# Patient Record
Sex: Female | Born: 1935 | Race: White | Hispanic: No | Marital: Married | Smoking: Former smoker
Health system: Southern US, Community
[De-identification: ages and names within clinical notes are randomized; demographics above are authoritative.]

## PROBLEM LIST (undated history)

## (undated) DIAGNOSIS — K219 Gastro-esophageal reflux disease without esophagitis: Secondary | ICD-10-CM

## (undated) DIAGNOSIS — R413 Other amnesia: Secondary | ICD-10-CM

## (undated) DIAGNOSIS — M1990 Unspecified osteoarthritis, unspecified site: Secondary | ICD-10-CM

## (undated) DIAGNOSIS — M1712 Unilateral primary osteoarthritis, left knee: Secondary | ICD-10-CM

## (undated) DIAGNOSIS — E7800 Pure hypercholesterolemia, unspecified: Secondary | ICD-10-CM

## (undated) DIAGNOSIS — C801 Malignant (primary) neoplasm, unspecified: Secondary | ICD-10-CM

## (undated) HISTORY — PX: TONSILLECTOMY: SUR1361

## (undated) HISTORY — PX: MASTECTOMY: SHX3

## (undated) HISTORY — PX: JOINT REPLACEMENT: SHX530

## (undated) HISTORY — PX: APPENDECTOMY: SHX54

---

## 1971-11-08 DIAGNOSIS — C801 Malignant (primary) neoplasm, unspecified: Secondary | ICD-10-CM

## 1971-11-08 HISTORY — DX: Malignant (primary) neoplasm, unspecified: C80.1

## 1973-11-07 HISTORY — PX: ABDOMINAL HYSTERECTOMY: SHX81

## 1973-11-07 HISTORY — PX: BREAST SURGERY: SHX581

## 1999-03-24 ENCOUNTER — Encounter: Payer: Self-pay | Admitting: Orthopedic Surgery

## 1999-03-24 ENCOUNTER — Ambulatory Visit (HOSPITAL_COMMUNITY): Admission: RE | Admit: 1999-03-24 | Discharge: 1999-03-24 | Payer: Self-pay | Admitting: Orthopedic Surgery

## 1999-07-22 ENCOUNTER — Ambulatory Visit (HOSPITAL_COMMUNITY): Admission: RE | Admit: 1999-07-22 | Discharge: 1999-07-22 | Payer: Self-pay | Admitting: Orthopedic Surgery

## 1999-07-22 ENCOUNTER — Encounter: Payer: Self-pay | Admitting: Orthopedic Surgery

## 2000-12-04 ENCOUNTER — Inpatient Hospital Stay (HOSPITAL_COMMUNITY): Admission: EM | Admit: 2000-12-04 | Discharge: 2000-12-11 | Payer: Self-pay | Admitting: Emergency Medicine

## 2000-12-04 ENCOUNTER — Encounter: Payer: Self-pay | Admitting: Emergency Medicine

## 2000-12-05 ENCOUNTER — Encounter: Payer: Self-pay | Admitting: Orthopedic Surgery

## 2000-12-10 ENCOUNTER — Encounter: Payer: Self-pay | Admitting: Internal Medicine

## 2000-12-11 ENCOUNTER — Inpatient Hospital Stay (HOSPITAL_COMMUNITY)
Admission: RE | Admit: 2000-12-11 | Discharge: 2000-12-18 | Payer: Self-pay | Admitting: Physical Medicine & Rehabilitation

## 2001-08-14 ENCOUNTER — Encounter (INDEPENDENT_AMBULATORY_CARE_PROVIDER_SITE_OTHER): Payer: Self-pay | Admitting: Specialist

## 2001-08-14 ENCOUNTER — Ambulatory Visit (HOSPITAL_COMMUNITY): Admission: RE | Admit: 2001-08-14 | Discharge: 2001-08-14 | Payer: Self-pay | Admitting: Gastroenterology

## 2001-08-23 ENCOUNTER — Encounter: Payer: Self-pay | Admitting: Gastroenterology

## 2001-08-23 ENCOUNTER — Ambulatory Visit (HOSPITAL_COMMUNITY): Admission: RE | Admit: 2001-08-23 | Discharge: 2001-08-23 | Payer: Self-pay | Admitting: Gastroenterology

## 2001-09-04 ENCOUNTER — Encounter: Admission: RE | Admit: 2001-09-04 | Discharge: 2001-11-02 | Payer: Self-pay | Admitting: Orthopedic Surgery

## 2001-11-15 ENCOUNTER — Ambulatory Visit (HOSPITAL_COMMUNITY): Admission: RE | Admit: 2001-11-15 | Discharge: 2001-11-15 | Payer: Self-pay | Admitting: Gastroenterology

## 2001-11-15 ENCOUNTER — Encounter (INDEPENDENT_AMBULATORY_CARE_PROVIDER_SITE_OTHER): Payer: Self-pay | Admitting: Specialist

## 2001-11-16 ENCOUNTER — Encounter: Admission: RE | Admit: 2001-11-16 | Discharge: 2001-12-27 | Payer: Self-pay | Admitting: Orthopedic Surgery

## 2002-04-01 ENCOUNTER — Encounter: Admission: RE | Admit: 2002-04-01 | Discharge: 2002-06-30 | Payer: Self-pay | Admitting: Orthopedic Surgery

## 2002-07-19 ENCOUNTER — Ambulatory Visit (HOSPITAL_COMMUNITY): Admission: RE | Admit: 2002-07-19 | Discharge: 2002-07-19 | Payer: Self-pay | Admitting: Gastroenterology

## 2002-08-09 ENCOUNTER — Ambulatory Visit (HOSPITAL_COMMUNITY): Admission: RE | Admit: 2002-08-09 | Discharge: 2002-08-09 | Payer: Self-pay | Admitting: Gastroenterology

## 2003-09-24 ENCOUNTER — Encounter: Admission: RE | Admit: 2003-09-24 | Discharge: 2003-09-24 | Payer: Self-pay | Admitting: Orthopedic Surgery

## 2003-10-04 ENCOUNTER — Encounter: Admission: RE | Admit: 2003-10-04 | Discharge: 2003-10-04 | Payer: Self-pay | Admitting: Orthopedic Surgery

## 2003-11-18 ENCOUNTER — Encounter: Admission: RE | Admit: 2003-11-18 | Discharge: 2003-11-18 | Payer: Self-pay | Admitting: Orthopedic Surgery

## 2003-12-02 ENCOUNTER — Encounter: Admission: RE | Admit: 2003-12-02 | Discharge: 2003-12-02 | Payer: Self-pay | Admitting: Orthopedic Surgery

## 2003-12-16 ENCOUNTER — Encounter: Admission: RE | Admit: 2003-12-16 | Discharge: 2003-12-16 | Payer: Self-pay | Admitting: Orthopedic Surgery

## 2004-04-13 ENCOUNTER — Ambulatory Visit (HOSPITAL_COMMUNITY): Admission: RE | Admit: 2004-04-13 | Discharge: 2004-04-13 | Payer: Self-pay | Admitting: Ophthalmology

## 2004-04-30 ENCOUNTER — Encounter: Admission: RE | Admit: 2004-04-30 | Discharge: 2004-04-30 | Payer: Self-pay | Admitting: Neurosurgery

## 2005-03-18 ENCOUNTER — Encounter: Admission: RE | Admit: 2005-03-18 | Discharge: 2005-03-18 | Payer: Self-pay | Admitting: Internal Medicine

## 2005-04-14 ENCOUNTER — Ambulatory Visit (HOSPITAL_COMMUNITY): Admission: RE | Admit: 2005-04-14 | Discharge: 2005-04-14 | Payer: Self-pay | Admitting: Neurosurgery

## 2005-04-28 ENCOUNTER — Encounter: Admission: RE | Admit: 2005-04-28 | Discharge: 2005-04-28 | Payer: Self-pay | Admitting: Neurosurgery

## 2005-05-23 ENCOUNTER — Encounter: Admission: RE | Admit: 2005-05-23 | Discharge: 2005-05-23 | Payer: Self-pay | Admitting: Neurosurgery

## 2005-06-16 ENCOUNTER — Emergency Department (HOSPITAL_COMMUNITY): Admission: EM | Admit: 2005-06-16 | Discharge: 2005-06-17 | Payer: Self-pay | Admitting: *Deleted

## 2005-07-13 ENCOUNTER — Ambulatory Visit (HOSPITAL_COMMUNITY): Admission: RE | Admit: 2005-07-13 | Discharge: 2005-07-13 | Payer: Self-pay | Admitting: Neurosurgery

## 2005-08-22 ENCOUNTER — Encounter: Admission: RE | Admit: 2005-08-22 | Discharge: 2005-10-21 | Payer: Self-pay | Admitting: Orthopedic Surgery

## 2005-12-29 IMAGING — CR DG CHEST 2V
2 series · 2 of 2 positions shown · non-contrast
Comparison: [DATE].

CLINICAL DATA: Preop for surgery on left hip.  
 CHEST - 2 VIEW:

[view not recorded (1 of 2)]
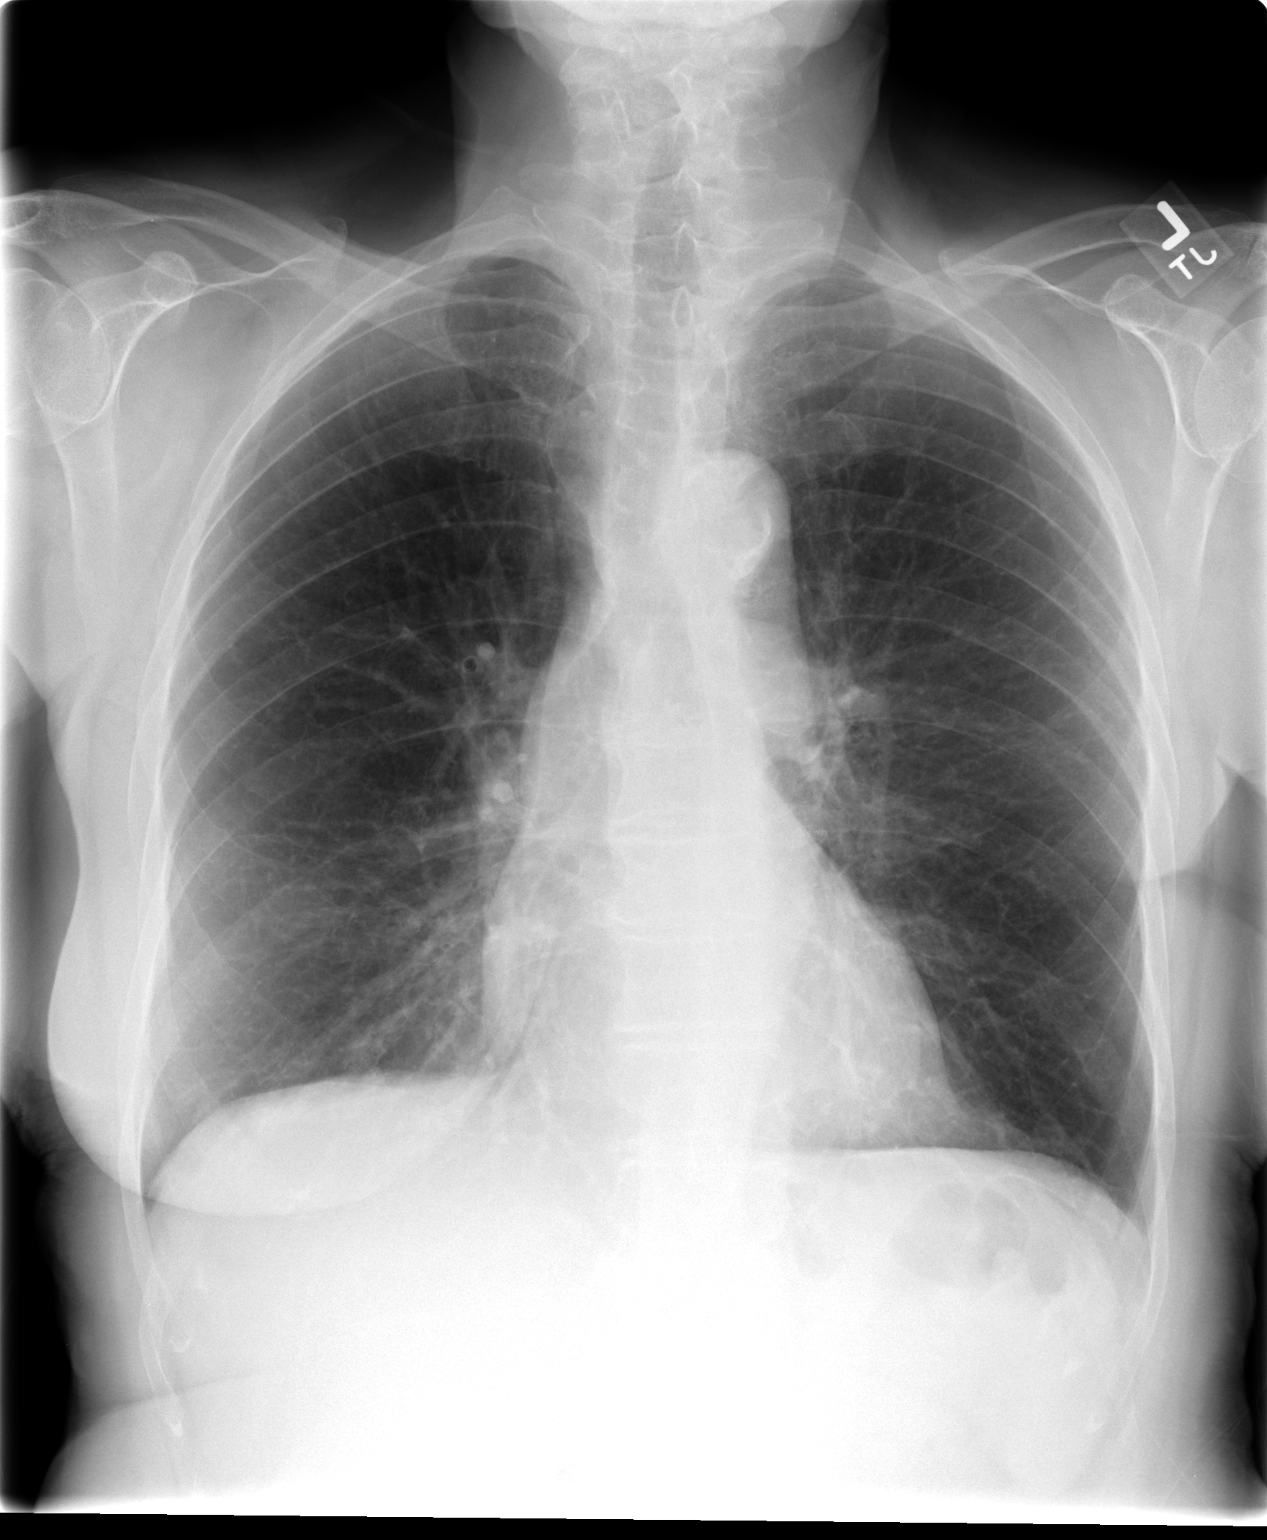

[view not recorded (2 of 2)]
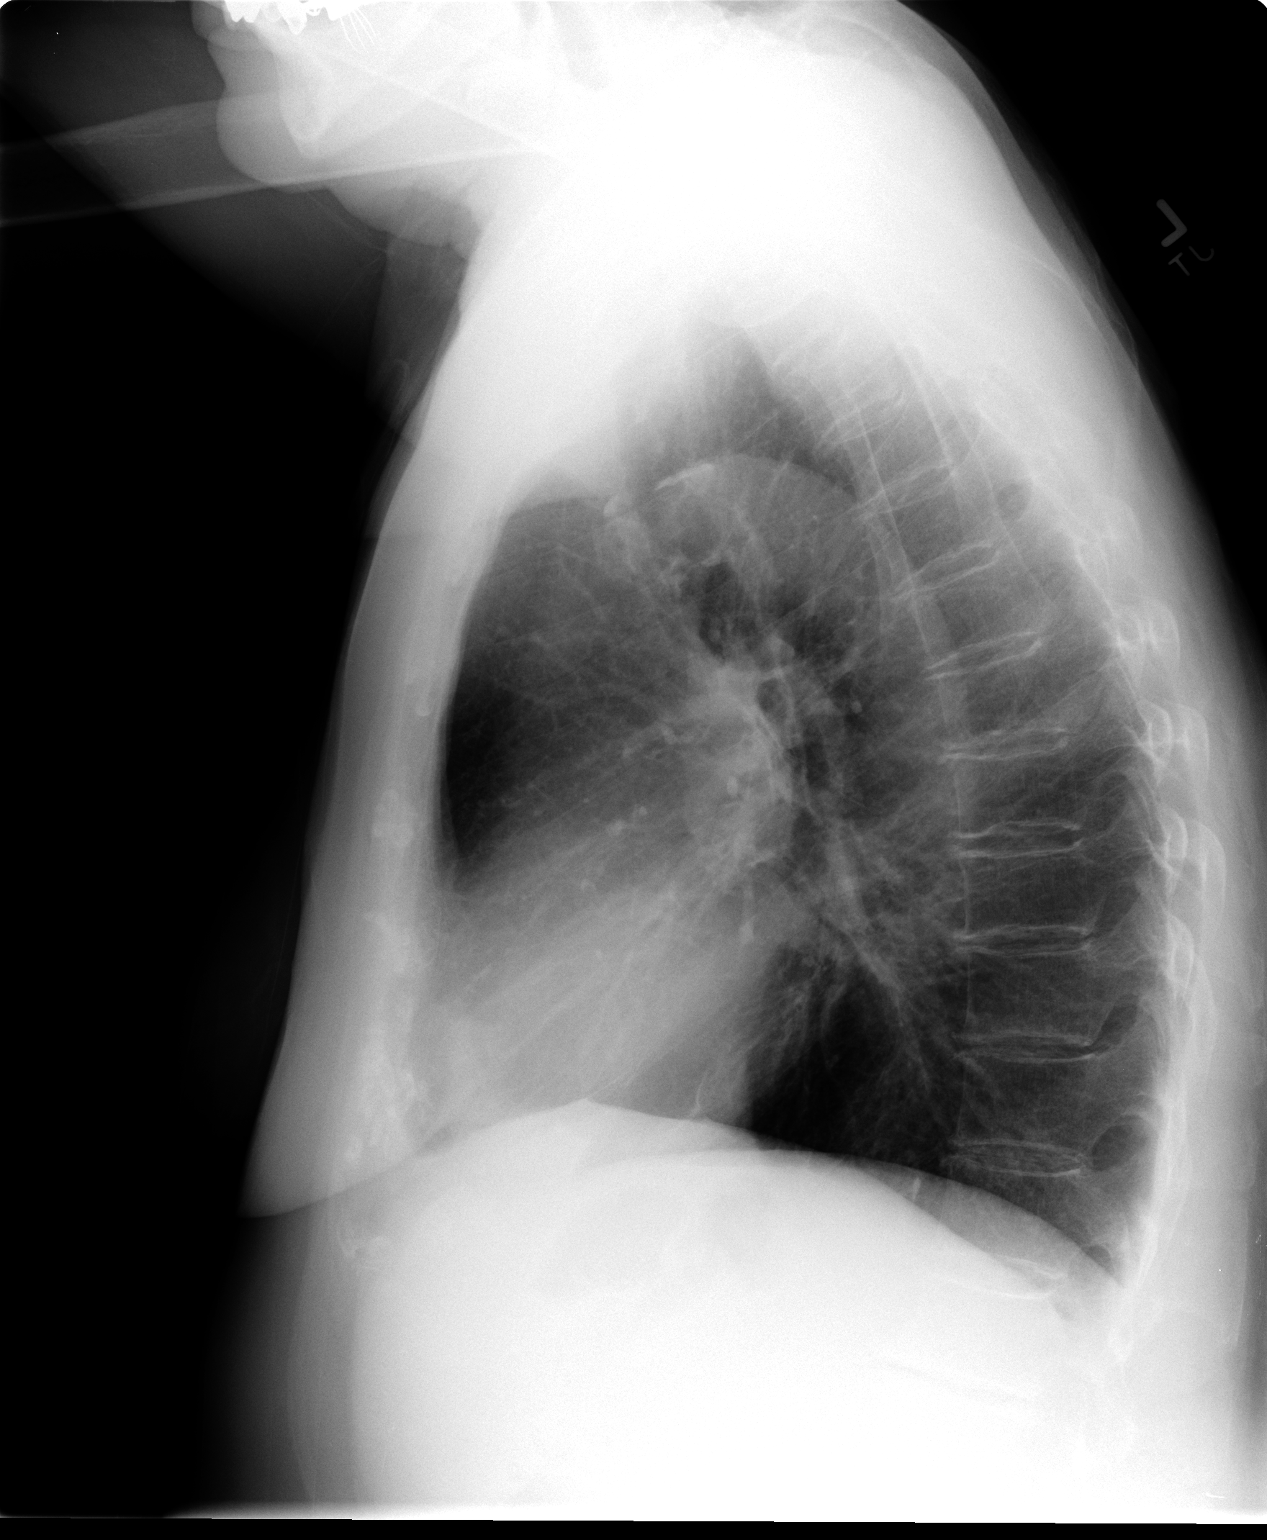

[2 of 2 positions shown; findings below may reference images not displayed]

FINDINGS: Two views of the chest show the lungs to be clear but hyperaerated.  Mild peribronchial thickening is noted.  The heart is within normal limits in size.  The bones are osteopenic.
IMPRESSION: No active lung disease.

## 2006-01-09 ENCOUNTER — Inpatient Hospital Stay (HOSPITAL_COMMUNITY): Admission: RE | Admit: 2006-01-09 | Discharge: 2006-01-12 | Payer: Self-pay | Admitting: Orthopedic Surgery

## 2006-01-09 ENCOUNTER — Ambulatory Visit: Payer: Self-pay | Admitting: Physical Medicine & Rehabilitation

## 2006-01-12 ENCOUNTER — Inpatient Hospital Stay
Admission: RE | Admit: 2006-01-12 | Discharge: 2006-01-19 | Payer: Self-pay | Admitting: Physical Medicine & Rehabilitation

## 2006-03-01 ENCOUNTER — Encounter: Admission: RE | Admit: 2006-03-01 | Discharge: 2006-03-28 | Payer: Self-pay | Admitting: Orthopedic Surgery

## 2007-06-22 ENCOUNTER — Encounter: Admission: RE | Admit: 2007-06-22 | Discharge: 2007-06-22 | Payer: Self-pay | Admitting: Internal Medicine

## 2007-06-26 ENCOUNTER — Encounter: Admission: RE | Admit: 2007-06-26 | Discharge: 2007-06-26 | Payer: Self-pay | Admitting: Internal Medicine

## 2007-06-27 ENCOUNTER — Encounter: Admission: RE | Admit: 2007-06-27 | Discharge: 2007-06-27 | Payer: Self-pay | Admitting: Internal Medicine

## 2007-07-02 ENCOUNTER — Encounter (INDEPENDENT_AMBULATORY_CARE_PROVIDER_SITE_OTHER): Payer: Self-pay | Admitting: Radiology

## 2007-07-02 ENCOUNTER — Encounter: Admission: RE | Admit: 2007-07-02 | Discharge: 2007-07-02 | Payer: Self-pay | Admitting: Internal Medicine

## 2007-07-13 ENCOUNTER — Encounter: Admission: RE | Admit: 2007-07-13 | Discharge: 2007-07-13 | Payer: Self-pay | Admitting: Radiology

## 2008-02-05 ENCOUNTER — Encounter: Admission: RE | Admit: 2008-02-05 | Discharge: 2008-02-05 | Payer: Self-pay | Admitting: Internal Medicine

## 2008-03-06 ENCOUNTER — Encounter: Admission: RE | Admit: 2008-03-06 | Discharge: 2008-03-06 | Payer: Self-pay | Admitting: Internal Medicine

## 2008-04-14 ENCOUNTER — Encounter: Admission: RE | Admit: 2008-04-14 | Discharge: 2008-04-14 | Payer: Self-pay | Admitting: Internal Medicine

## 2010-11-27 ENCOUNTER — Encounter: Payer: Self-pay | Admitting: Orthopedic Surgery

## 2010-11-28 ENCOUNTER — Encounter: Payer: Self-pay | Admitting: Neurosurgery

## 2011-02-07 ENCOUNTER — Other Ambulatory Visit: Payer: Self-pay | Admitting: Orthopedic Surgery

## 2011-02-07 DIAGNOSIS — M545 Low back pain: Secondary | ICD-10-CM

## 2011-02-09 ENCOUNTER — Ambulatory Visit
Admission: RE | Admit: 2011-02-09 | Discharge: 2011-02-09 | Disposition: A | Discharge status: Home / Self Care | Payer: Medicare Other | Source: Ambulatory Visit | Attending: Orthopedic Surgery | Admitting: Orthopedic Surgery

## 2011-02-09 DIAGNOSIS — M545 Low back pain: Secondary | ICD-10-CM

## 2011-03-25 NOTE — Op Note (Signed)
[facility]  Patient:    Alexis York, Alexis York                      MRN: [id number] Proc. Date: 12/05/00 Adm. Date:  [id number] Attending:  Colbert Ewing                           Operative Report  PREOPERATIVE DIAGNOSES: 1. Osteoarthritis, right hip. 2. Subtrochanteric fracture, right femur acute.  SURGICAL PROCEDURE:  Right total hip and ORIF of subtrochanteric femur fracture.  POSTOPERATIVE DIAGNOSES: 1. Osteoarthritis, right hip. 2. Subtrochanteric fracture, right femur, acute.  SURGEON:  John L. Dorothyann Gibbs, M.D.  ASSISTANT:  Jamelle Rushing, P.A.  ANESTHESIA:  General.  PATHOLOGY:  This 77 year old white female was actually on the schedule for a right total hip replacement, and had just visited family wedding in [place] when she fell at home the night of admission, sustaining a subtrochanteric femur fracture on the same side as end-stage osteoarthritis of the right hip. Both problems are being cared for simultaneously today.  DESCRIPTION OF PROCEDURE:  Under general anesthesia, the patient was placed in the left lateral decubitus position.  A standard posterior approach is made, extending it distally another four inches.  Dissection was carried down through skin and subcutaneous tissue, splitting the IT band.  A portion of the gluteus maximus insertion on the distal fragment had to be released to expose the fracture.  The short external rotators and hip capsule were also taken down from the medial side of the femoral neck, and consequently a comminuted greater trochanter and virtual transverse subtrochanteric fracture were clearly identified.  Attention was then turned to the distal shaft, which is progressively reamed up to 16 mm; which gave an excellent press fit.  The proximal femoral neck is then exposed and osteotomized.  The hip ball is removed at this point.  The acetabulum is exposed with cobra retractors inferiorly and wing  retractors superiorly.  It is appropriately reamed up to a 50 mm, and a 52 mm tri-spiked acetabular cuff is inserted.  At this point a 10-degree poly is inserted and trial off of rasp reveals relatively good fit, alignment and stability using a plus-12 hip ball.  Plan is made to use the Prodigy fully porous coated stem, and as there is approximately four inches of scratch fit distal to the near transverse subtrochanteric fracture.  At this point there is no calcar to set the medial stem on, and there is a large comminuted trochanteric fragment.  At this point the acetabular apex hole eliminator is inserted, and the acetabular liner Enduron 28 mm with 52 liner.  With acetabular side finished, the femoral component is inserted; it is inserted in such a manner as to have about 15 degrees anteversion, using the Prodigy 16.5 mm stem.  At this point the Palmedica trochanteric clamp with two Dall-Miles cables are placed in such a manner as to clamp the greater trochanter over the trochanteric portion of the AML hip stem, running the chrome cobalt cables through the hole in the AML stem.  In this manner, the trochanter is attached to the prosthesis.  Several smaller fragments, including calcar fragments and lesser trochanteric fragments, are then grasped with an additional Dall-Miles cable, which grasped additionally the distal portion of the greater trochanter pulling all of these fragments together.  Once all three of these are tightened in place, the femoral component is very stable  to both rotation and range of motion testing.  It is stable through a normal range of motion.  At this point, attention is turned to repair the hip capsule with #1 Tycron. The IT band is then closed with #1 Tycron, subcutaneous with 0 and 2-0 Vicryl, and skin clips.  OPERATIVE TIME:  Approximately 1 hr 30 min.  DISPOSITION:  Hematocrit during the case was 25, and one unit of blood was started.  Estimated blood  loss was 600 cc and the second unit of blood will be given in recovery.  Leg length at the end of the case was within 1/4 inch equal. DD:  12/05/00 TD:  12/05/00 Job: [id number]

## 2011-03-25 NOTE — H&P (Signed)
Alexis York, Alexis York               ACCOUNT NO.:  [id number]   MEDICAL RECORD NO.:  [id number]          PATIENT TYPE:  INP   LOCATION:  NA                           FACILITY:  [facility]   PHYSICIAN:  John L. Rendall, M.D.  DATE OF BIRTH:  1921-11-25   DATE OF ADMISSION:  DATE OF DISCHARGE:                                HISTORY & PHYSICAL   DATE OF ADMISSION:  January 09, 2006   CHIEF COMPLAINT:  Painful left hip.   HISTORY OF PRESENT ILLNESS:  An 77 year old white female with longstanding  pain over the left groin and hip with sharp stabbing pain and ache. She is  now to the point where she is having significant pain with activities of  daily living. She must use a cane for ambulation. Radiographically, she is  noted to have advanced DJD of the left hip and is now indicated for left  total hip replacement.   PAST MEDICAL HISTORY:  In general, health is fair. 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   SURGERY:  As above.   MEDICATIONS:  Actonel 30 mg weekly. She is also on a medication for her  bladder. She is unable to remember or has not written down the medications.   ALLERGIES:  CODEINE and CODEINE DERIVATIVES.   REVIEW OF SYSTEMS:  Positive for glasses, dentures, osteoporosis, cystocele,  left breast dysplasia.   FAMILY HISTORY:  Positive for mother who had breast, uterus, stomach  carcinoma, and who is deceased. Father who is deceased from renal cell  carcinoma. She has also had two sisters that are deceased - one with throat  cancer and one with breast cancer.   SOCIAL HISTORY:  An 77 year old white female who is married and retired. She  denies the use of  tobacco. She does use alcohol once a day.   EXAMINATION TODAY:  GENERAL:  Reveals a 77 year old white female, well-  developed, well-nourished; alert, pleasant, cooperative, in moderate  distress secondary to left groin and hip pain.  VITAL SIGNS:  Reveal temperature 97.5, pulse 76, respirations 18, blood  pressure 108/56.  HEENT:  Head is normocephalic. Eyes:  Pupils equal, round, and reactive to  light and accommodation. Extraocular movements intact. Ears, nose, and  throat were benign. Cerumen in the canals.  NECK:  Supple, no thyromegaly.  CHEST:  Had good expansion. Lungs were essentially clear.  CARDIAC:  Had a regular rhythm and rate, normal S1, S2. No murmurs, rubs, or  gallops appreciated. Pulses 2+ bilateral and symmetric, no bruits were  noted.  ABDOMEN:  Scaphoid, soft, nontender, no mass palpable. Normal bowel sounds  present.  GENITAL, RECTAL, AND BREAST:  Exam not indicated for the procedure.  CENTRAL NERVOUS SYSTEM:  She is oriented x3. Cranial nerves II-XII grossly  intact.  MUSCULOSKELETAL:  She has markedly positive logroll  in the left hip. She has  only a jog of internal and external rotation. She can flex to 95 degrees.  Marked pain with range of motion of the hip. Sensation is intact. Trace  pitting edema bilaterally.   Radiographic study reveals OA of the left hip.   CLINICAL IMPRESSION:  Osteoarthritis of the left hip.   RECOMMENDATIONS:  At this time we are going to plan on left total hip  arthroplasty. Procedure risks and benefits have been fully explained with  her. I used a prosthesis to show her the materials used. She has  understanding. She will proceed with total hip arthroplasty.      Oris Drone Petrarca, P.A.-C.      Carlisle Beers. Rendall, M.D.  Electronically Signed    BDP/MEDQ  D:  12/30/2005  T:  12/30/2005  Job:  [id number]

## 2011-03-25 NOTE — Discharge Summary (Signed)
[facility]  Patient:    Alexis York, Alexis York                      MRN: [id number] Adm. Date:  [id number] Disc. Date: [id number] Attending:  Faith Rogue T Dictator:   Dian Situ, PA CC:         Gordy Savers, M.D.  John L. Dorothyann Gibbs, M.D.   Discharge Summary  DISCHARGE DIAGNOSES: 1. Status post right total hip replacement and open reduction and internal    fixation of subtrochanteric hip fracture. 2. Urinary retention, resolved. 3. Mood stable.  HISTORY OF PRESENT ILLNESS:  Ms. Filosa is a 77 year old female with history of hypercholesterolemia, left breast cancer, depression, who fell on December 04, 2000, sustaining a right subtrochanteric hip fracture.  Evaluated by Dr. Priscille Kluver, and underwent right total hip replacement with ORIF of subtrochanteric hip fracture on December 05, 2000.  Postoperatively is touchdown weightbearing with Coumadin being used for DVT prophylaxis.  Medical issues have included problems with productive cough secondary to bronchitis as well as depressed mood.   Internal Medicine was consulted, and patient was started on Celexa to help with her mood.  Currently on IV Rocephin for bronchitis.  She is at min assist for transfers, min assist for ambulating 20 feet with a rolling walker.  PAST MEDICAL HISTORY: 1. Left mastectomy. 2. TAH/BSO. 3. Appendectomy. 4. 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 5. Chronic nausea. 6. OA.  ALLERGIES:  CODEINE and NIACIN.  SOCIAL HISTORY:  The patient is married, lives in one-level home with four steps at entry.  Was independent prior to admission.  Quit tobacco in 1973. Drinks one to two alcoholic drinks per day.  HOSPITAL COURSE:  Ms. Alexis York was admitted to rehabilitation on December 11, 2000, for inpatient therapies to consist of PT, OT daily.  Past admission the patient was maintained on Coumadin for DVT prophylaxis and  touchdown weightbearing status.  The patient was taken off IV antibiotics on December 12, 2000, and started on Ceftin.  Laboratories were checked at admission showing sodium 139, potassium 4.0, chloride 102, CO2 27, BUN 8, creatinine 0.8.  Of note, LFTs were elevated with AST at 43, ALT 53, alkaline phosphatase 215, total bilirubin 1.4.  CBC showed hemoglobin 11.6, hematocrit 34.6, white count 10.0, platelets _____.  Stool guaiacs were done x 2, and she was noted to have one positive guaiac.  A UA/UCS done showed 15,000 colonies of enterococcus which was pansensitive.  She has had problems with pain control; however, was unable to tolerate oxycodone.  The pain was reasonably controlled with the use of p.r.n. Ultram.  She was noted to have problems with urinary retention and was started on Flomax and Urecholine.  By the time of discharge the patient was voiding without difficulty and had been tapered off Urecholine.  The patient reported increased nausea with use of Pepcid and was changed over to Cipro instead.  Her mood has been stable, and she expressed displeasure with use of Celexa.  Both patient and husband felt that she did not require antidepressant and would not use this past discharge; therefore, this was discontinued per their request.  At the time of discharge the patient was modified independent for transfers, modified independent for ambulating short distances to maintain her touchdown weightbearing status.  Wheelchair was altered for home use secondary to inability to achieve household tasks, household ambulating distances, as well as decreased endurance and confidence.  She was at modified independent level for upper body care, requires supervision with assistive device for lower body care.  Husband is to provide assistance and supervision as needed past discharge.  On December 18, 2000, the patient is discharged to home.  The patient is to continue on Coumadin for 10 additional  days with [facility] to follow pro times. On December 18, 2000, the patient is discharged to home.  DISCHARGE MEDICATIONS: 1. Cipro 250 mg b.i.d. 2. Coumadin 3 mg per day. 3. Flomax 0.4 mg q.h.s. 4. Humibid L.A. 600 mg b.i.d. 5. Ultram 50 mg q.6h. p.r.n. pain. 6. Skelaxin 400 mg q.6h. p.r.n. spasms.  ACTIVITY:  Touchdown weightbearing right lower extremity, total hip precautions on right.  WOUND CARE:  Keep area clean and dry.  SPECIAL INSTRUCTIONS:  Genevieve Norlander to continue home health PT, OT, as well as ____ for pro time draws on December 21, 2000.  FOLLOW-UP:  The patient is to follow up with Dr. Priscille Kluver for recheck in three to four weeks.  Follow up with Dr. Riley Kill as needed. DD:  02/12/01 TD:  02/12/01 Job: [id number]

## 2011-03-25 NOTE — Op Note (Signed)
Alexis York, Alexis York               ACCOUNT NO.:  [id number]   MEDICAL RECORD NO.:  [id number]          PATIENT TYPE:  INP   LOCATION:  1505                         FACILITY:  [facility]   PHYSICIAN:  John L. Rendall, M.D.  DATE OF BIRTH:  1921/11/09   DATE OF PROCEDURE:  01/09/2006  DATE OF DISCHARGE:                                 OPERATIVE REPORT   PREOPERATIVE DIAGNOSIS:  Osteoarthritis left hip.   POSTOPERATIVE DIAGNOSIS:  Osteoarthritis left hip.   SURGICAL PROCEDURES:  Left AML total hip replacement.   SURGEON:  Dr. Erasmo Leventhal.   ASSISTANT:  Arnoldo Morale, PA-C.   ANESTHESIA:  General.   PATHOLOGY:  The patient has bare bone on the femoral head and advanced  osteoarthritis of the acetabulum, osteoporosis consistent with her age.   PROCEDURE:  Under general anesthesia, the left leg is prepared with DuraPrep  and draped as a sterile field in the right lateral decubitus position. A  posterior approach is made. The IT band is split in the line of its fibers,  deep Charnley retractor is inserted, incision is approximately 7 inches in  length. Short external rotators and hip capsule are taken down with  electrocautery from the femur, hip capsule is opened in a T-shaped manner  after first teasing off the short external rotators. The hip is then  dislocated, the superior femoral neck is examined and debrided. The IM  initiator and canal finder are used. Sequential reaming to 16 mm is then  done which gives a good scratch fit. At this point, the femoral neck is  osteotomized, rasps are then used, 12 narrow, 13-1/2 narrow ,15 narrow and  16 narrow which gives good fit of the upper triangle and seats well in the  calcar. A calcar reamer is then used. With the femur prepared, attention is  turned to the acetabulum, 2 cobras are placed inferiorly and the hip  superiorly while leaflets of the capsule are elevated and preserved. Two  wing retractors medium and large were placed beneath  the capsule and labrum  on the superior rim of the acetabulum and the entire labrum was then excised  in one large piece. The acetabular exposure is then excellent, sequential  reaming is then done, 45, 47, 49, 51 and 53; 53 seems to give a reasonably  good fit. It is then made somewhat deeper to get full bleeding bone with the  52 reamer going back to a 53 reamer and trial seating of a 54 trial is then  done with an angled 10 degrees poly and trial seating of the rasp and short  neck to 32 mm femoral head gives good position and alignment but tends to  sublux posteriorly. The prodigy neck however corrects this and it is fully  stable with the leg lengths apparently equal. At this point permanent  components are then obtained. The 54 mm 100 series acetabular component is  inserted. The pinnacle acetabular cup is used and then the marathon liner  __________ is used, 54 mm outside diameter, 32 mm inside diameter with apex  hole eliminator of course in  place. With the acetabular side done, attention  was turned to the femoral side, a prodigy stem 16-1/2 mm small stature is  used. This seats nicely on the calcar and the femoral head 32 mm +1 is then  attached to the prosthetic stem. The hip is then reduced, it is stable  through full usual range of motion. The hip was then copiously irrigated, it  is closed in layers with #1 Tycron in the capsule. Short external rotators  are reattached  particularly the piriformis and the IT band is then closed with #1 Tycron,  #1 Vicryl subcu, 2-0 Vicryl in the immediate subcu and skin clips. Operative  time 1 hour. Blood loss less than 200 mL. The patient tolerated the  procedure well and returned to recovery in good condition.      John L. Rendall, M.D.  Electronically Signed     JLR/MEDQ  D:  01/09/2006  T:  01/09/2006  Job:  [id number]

## 2011-03-25 NOTE — H&P (Signed)
NAME:  Alexis York, Alexis York                         ACCOUNT NO.:  [id number]   MEDICAL RECORD NO.:  [id number]                   PATIENT TYPE:  OIB   LOCATION:  2857                                 FACILITY:  [facility]   PHYSICIAN:  Guadelupe Sabin, M.D.             DATE OF BIRTH:  May 21, 1922   DATE OF ADMISSION:  04/13/2004  DATE OF DISCHARGE:                                HISTORY & PHYSICAL   This was a planned outpatient surgical admission of this 77 year old white  female, admitted for cataract implant surgery of the right eye.   HISTORY OF PRESENT ILLNESS:  This patient has been followed in my office for  routine eye care since June 05, 1985.  At that time, her vision was recorded  at 20/70 in each eye without correction and 20/20 with correction.  Over the  ensuing years, the patient has gradually developed nuclear cataract  formation noted in 2000.  This has become progressively worse and has caused  blurring of her vision especially in the right eye and glare sensation.  Glare testing reducing her vision to 20/400 in each eye.  With correction,  the patient still sees approximately 20/30-1 right eye, 20/25 left eye.  Due  to the patient's complaints, she has elected to proceed with cataract  implant surgery at this time.  She was given oral discussion and printed  information concerning the procedure and its possible complications.  She  signed an informed consent and arrangements were made for her outpatient  admission at this time.   PAST MEDICAL HISTORY:  The patient is under the care of Dr. Brunilda Payor.  Dr. Jarold Motto feels the patient is in excellent overall general health,  taking multiple medications including Aciphex, Actonel, __________  ,  Celebrex, Ditropan.   She is said to be intolerant or allergic to CODEINE and a low risk for the  proposed surgery.   The patient has requested surgery under general anesthesia due to her panic  attacks.   REVIEW OF SYSTEMS:  No  cardiorespiratory complaints.   PHYSICAL EXAMINATION:  VITAL SIGNS:  As recorded on admission, blood  pressure 120/59, pulse 75, respirations 16, temperature 97.0.  GENERAL APPEARANCE:  The patient is a pleasant, well nourished, well  developed, 77 year old, white female in no acute distress.  HEENT:  Eyes:  Visual acuity as noted above.  Applanation tonometry 15-mm  right eye, 13-mm left eye.  Slit lamp examination, the eyes are white and  clear with a clear cornea, deep and clear anterior chamber and nuclear  cataract formation both eyes.  Dilated detailed fundus examination shows a  clear vitreous, attached retina with normal optic nerve blood vessels and  macula.  CHEST:  Lungs clear to percussion auscultation.  HEART:  Normal sinus rhythm.  No cardiomegaly.  No murmurs.  ABDOMEN:  Negative.  EXTREMITIES:  Negative.   ADMISSION DIAGNOSIS:  Senile nuclear cataract both eyes, right  eye greater  than left.   SURGICAL PLAN:  Cataract implant surgery, right eye.                                                Guadelupe Sabin, M.D.    HNJ/MEDQ  D:  04/13/2004  T:  04/14/2004  Job:  [id number]   cc:   Brunilda Payor, Dr.

## 2011-03-25 NOTE — Op Note (Signed)
NAME:  Alexis York, Alexis York                         ACCOUNT NO.:  [id number]   MEDICAL RECORD NO.:  [id number]                   PATIENT TYPE:  OIB   LOCATION:  2857                                 FACILITY:  [facility]   PHYSICIAN:  Guadelupe Sabin, M.D.             DATE OF BIRTH:  [birth year]   DATE OF PROCEDURE:  04/13/2004  DATE OF DISCHARGE:                                 OPERATIVE REPORT   PREOPERATIVE DIAGNOSIS:  Senile nuclear cataract, right eye.   POSTOPERATIVE DIAGNOSIS:  Senile nuclear cataract, right eye.   NAME OF OPERATION:  Planned extracapsular cataract extraction,  phacoemulsification, primary insertion of posterior chamber intraocular lens  implant.   SURGEON:  Guadelupe Sabin, M.D.   ASSISTANT:  Nurse.   ANESTHESIA:  General plus local retrobulbar block with 4% Xylocaine and  0.75% Marcaine with Wydase added, also topical Tetracaine was used and  intraocular Xylocaine.   OPERATIVE PROCEDURE:  After the patient was prepped and draped, a lid  speculum was inserted in the right eye.  Schiotz tonometry was recorded at 7  scale units with a 5.5 g weight.  A superior rectus traction suture was  placed.  A peritomy was performed adjacent to the limbus from the 11 to 1  o'clock position.  The corneoscleral junction was cleaned and a  corneoscleral groove made with a 45 degree Superblade.  The anterior chamber  was then entered with the 2.5 mm diamond keratome at the 12 o'clock position  and the 15 degree blade at the 2:30 position.  Using a bent 26-gauge needle  on an Occucoat syringe, a circular capsulorhexis was begun and then  completed the Grabow forceps.  Hydrodissection and hydrodelineation were  performed using 1% Xylocaine.  The 30 degree phacoemulsification tip was  then inserted with slow, controlled emulsification of the lens nucleus.  Total ultrasonic time 1 minutes 26 seconds, average power level 12%.  Total  amount of fluid used 75 mL.  Following removal of  the nucleus, the residual  cortex was aspirated with the irrigation-aspiration tip.  The posterior  capsule appeared intact with a brilliant red fundus reflex.  It was  therefore elected to insert an Aller[employer] Topics [id number] silicone three-  piece posterior chamber intraocular lens implant, diopter strength +20.50.  This was inserted with the McDonald forceps into the anterior chamber and  then centered into the capsular bag using the [facility] lens rotator.  The lens  appeared to be well-centered.  The Healon and Occucoat which had been used  intermittently during the procedure was aspirated and replaced with balanced  salt solution and Miochol ophthalmic solution.  The operative incisions  appeared to be self-sealing, but it was elected to place a single 10-0  radial nylon suture across the larger 12 o'clock incision to ensure closure  in this patient under general anesthesia and to prevent endophthalmitis.  Maxitrol ointment was instilled in the conjunctival  cul-de-sac and a light  patch and protective shield applied.  Duration of procedure and anesthesia  administration 45 minutes.  The patient tolerated the procedure well in  general, left the operating room for the recovery room in good condition.                                               Guadelupe Sabin, M.D.    HNJ/MEDQ  D:  04/13/2004  T:  04/13/2004  Job:  [id number]

## 2011-03-25 NOTE — Discharge Summary (Signed)
Alexis York, Alexis York               ACCOUNT NO.:  [id number]   MEDICAL RECORD NO.:  [id number]          PATIENT TYPE:  INP   LOCATION:  1505                         FACILITY:  [facility]   PHYSICIAN:  John L. Rendall, M.D.  DATE OF BIRTH:  [birth year]   DATE OF ADMISSION:  01/09/2006  DATE OF DISCHARGE:  01/12/2006                                 DISCHARGE SUMMARY   ADMISSION DIAGNOSIS:  Osteoarthritis of the left hip.   DISCHARGE DIAGNOSES:  1.  Osteoarthritis of the left hip.  2.  History of breast malignancy.   PROCEDURE:  Left total knee arthroplasty.   HISTORY:  This patient is an 77 year old white female with longstanding pain  over the left groin and hip with sharp, stabbing pain and discomfort.  She  is now to the point where she is having significant pain with activities of  daily living.  She must use a cane for ambulation.  Radiographically, she is  noted to have advanced DJD of the left hip and is now indicated for left  total hip arthroplasty.   HOSPITAL COURSE:  An 77 year old female, admitted January 09, 2006, after  appropriate laboratory studies were obtained as well as 1 g of Ancef IV on  call to the operating room, was taken to the operating room where she  underwent a left total hip arthroplasty.  She tolerated the procedure well.  She was continued on Ancef 1 g IV q.8h. x3 doses.  A Dilaudid PCA pump was  used for postoperative pain management.  Consults with PT/OT, care  management, and rehab were ordered.  She was begun on Arixtra 2.5 mg subcu 7  p.m. x9 days.  Weightbearing as tolerated on the left in physical therapy.  A Foley was placed intraoperatively.  She was allowed out of bed the  following day.  A Foley was discontinued on the 6th.  She was weaned to oral  pain medicines.  Her IV was kept going on the 6th because of nausea.  She  also had some problems with use of the Percocet, and she was placed on  Dilaudid 2 mg p.o. 1-2 q.4h. p.r.n. for pain.  Phenergan  12.5 mg IV q.6h.  for nausea.  The Foley was left in until the 7th of March.  Robaxin 1 p.o.  q.6h. p.r.n. spasms.  The Dilaudid was increased to 2 mg 1-3 tabs q.4h.  p.r.n. for pain.  K-Dur 20 mEq p.o. twice daily x2 doses was started on the  7th for hypokalemia.  She did have improvement, and her IV was discontinued.  Bandages were changed.  She used Biofreeze for her back pain.  Her Celebrex  was discontinued on the 8th of March, and she was placed on a 5 mg  prednisone 6 day dose pack for her back pain, and it was felt that she was  able to be transferred to rehab, and this was accomplished on the 8th of  March.  She was discharged in improved condition.   LABORATORY DATA:  Admitted with a hemoglobin of 14.1, hematocrit of 41.7%,  white count 6400, platelets  313,000.  Discharge hemoglobin 8.7, hematocrit  25.4, white count 7600, platelets 217,000.  Pro time 13.0, INR 1.0, PTT 35.  Preop sodium 137, potassium 4.5, chloride 100, CO2 31, glucose 93, BUN 8,  creatinine 0.7, calcium 9.3, total protein 7.0, albumin 3.8, AST 19, ALT 16,  ALP 90, total bilirubin 0.7.  Discharge sodium 139, potassium 3.7, chloride  104, CO2 30, glucose 105, BUN 7, creatinine 0.5, calcium 8.0.  Urinalysis  was benign for voided urine.  Blood type was O positive, antibody screen  negative.  EKG was read as left sinus rhythm with possible left atrial  enlargement.  Radiographic studies showed no active lung disease.   DISCHARGE INSTRUCTIONS:  She was transferred to rehab to continue with  physical and occupational therapy as per protocol.  They will continue with  her medications preoperatively.  Continue with her Arixtra anticoagulation  for 7 days.  She will follow back up with Dr. Priscille Kluver in the office in 2  weeks postop.  She was discharged in improved condition.      Oris Drone Petrarca, P.A.-C.      Carlisle Beers. Rendall, M.D.  Electronically Signed    BDP/MEDQ  D:  01/31/2006  T:  02/02/2006  Job:   [id number]

## 2011-03-25 NOTE — H&P (Signed)
NAMEGIRTHA, KILGORE NO.:  [id number]   MEDICAL RECORD NO.:  [id number]          PATIENT TYPE:  ORB   LOCATION:  4532                         FACILITY:  [facility]   PHYSICIAN:  Ellwood Dense, M.D.   DATE OF BIRTH:  Apr 02, 1922   DATE OF ADMISSION:  01/12/2006  DATE OF DISCHARGE:                                HISTORY & PHYSICAL   PRIMARY CARE PHYSICIAN:  Dr. Jarome Matin   GI SPECIALIST:  Dr. Kinnie Scales   ORTHOPEDIST:  Dr. Priscille Kluver   HISTORY OF PRESENT ILLNESS:  Ms. Amit Meloy is an 77 year old female with  a history of right subtrochanteric femur fracture in 2002 which required a  right total hip replacement.  She was on the subacute unit at that time.  The patient was admitted January 09, 2006 with end-stage degenerative changes  of the left hip.  She had failed conservative treatment.   The patient underwent left total hip replacement January 09, 2006 by Dr.  Priscille Kluver.  She was placed on subcutaneous Arixtra for DVT prophylaxis.  She  was allowed weightbearing as tolerated.  She has had postoperative anemia  with hemoglobin of 8.6 and that is being monitored.  She denied any chest  pain or nausea and vomiting.  Her hospital course was uneventful with a  trial of prednisone for left leg pain.  She has also had Celebrex which was  recently discontinued.  The patient was evaluated by the rehabilitation  physicians and felt to be an appropriate candidate for inpatient  rehabilitation.  She was moved to the subacute unit for that purpose today.   REVIEW OF SYSTEMS:  Positive for reflux, retention, joint swelling, and  weakness.   PAST MEDICAL HISTORY:  1.  Early dementia.  2.  Gastroesophageal reflux disease.  3.  Osteoporosis.  4.  Prior appendectomy.  5.  Left mastectomy for non-malignant tumor.  6.  Mastoidectomy.  7.  Right femur fracture with total hip arthroplasty 2002.  8.  Urinary retention.  9.  Prior hysterectomy.  10. Cataract surgery.  11. Sinus  surgery.   FAMILY HISTORY:  Positive for cancer and coronary artery disease.   SOCIAL HISTORY:  The patient lives with her husband in [place].  They  have been married 62 years.  Husband can assist as needed.  She has a remote  tobacco history and occasional alcohol intake is reported.   FUNCTIONAL HISTORY:  Prior to admission:  Independent with a cane and  walker.   ALLERGIES:  ERYTHROMYCIN and PLASTIC TAPE with nausea secondary to CODEINE.   MEDICATIONS PRIOR TO ADMISSION:  1.  Actonel 30 mg every week.  2.  Aciphex 20 mg daily.  3.  Celebrex 200 mg b.i.d.  4.  Aricept 5 mg daily.  5.  Bethanechol 25 mg t.i.d.  6.  Calcium daily.  7.  Multivitamin daily.   Recent laboratories showed a hemoglobin of 8.7, hematocrit 25.4, platelet  count 217,000, white count 7.6.  Recent sodium was 139, potassium 3.7,  chloride 104, CO2 30, BUN 7, and creatinine 0.5.   PHYSICAL EXAMINATION:  GENERAL:  Well-appearing, middle-aged adult female  lying in bed in mild to no acute discomfort.  VITAL SIGNS:  Blood pressure 99/45 with a pulse 94, respiratory rate 18,  temperature 98.1.  HEENT:  Normocephalic, non-traumatic.  CARDIOVASCULAR:  Regular rate and rhythm.  S1, S2 without murmurs.  ABDOMEN:  Soft, nontender with positive bowel sounds.  LUNGS:  Clear to auscultation bilaterally.  NEUROLOGIC:  Alert and oriented x3.  Cranial nerves II-XII are intact.  Bilateral upper extremity examination showed 5/5 strength throughout.  Bulk  and tone were normal and reflexes were 2+ and symmetrical.  Sensation was  intact to light touch throughout the bilateral upper extremities.   Left lower extremity showed well healing hip wound with staples in place and  minimal drainage on dry dressing.  Right lower extremity strength was 4+/5  and left lower extremity strength was 4-/5.  Sensation was intact to light  touch throughout the bilateral lower extremities.  Reflexes were 2+ and  symmetrical and  sensation was intact to light touch throughout the right  lower extremity.  The reflexes were 1+ in the left lower extremity.   IMPRESSION:  Status post left total hip arthroplasty postoperative day #3  secondary to osteoarthritis performed by Dr. Priscille Kluver.   Presently, the patient has deficits in activities of daily living,  transfers, and ambulation secondary to the left total hip replacement.   PLAN:  1.  Admit to the subacute unit for daily OT and PT therapy to advance ADLs,      transfers, and ambulation.  2.  Continue subcutaneous Arixtra 2.5 mg subcutaneous daily for DVT      prophylaxis.  3.  Check admission laboratories including CBC and CMET Friday, January 13, 2006.  4.  Continue Robaxin 500 mg q.6h. p.r.n. for spasms along with Vicodin 5/325      one to two tablets p.o. q.4-6h. p.r.n. for pain.  5.  Continue Trinsicon one tablet p.o. b.i.d. and check admission      laboratories in the morning.  6.  Continue prednisone taper 10 mg p.o. b.i.d. today, then 5 mg t.i.d.      January 13, 2006, 5 mg b.i.d. January 14, 2006, then 5 mg daily x3 days, then      stop.  7.  Continue Aciphex 20 mg daily for GI prophylaxis.  8.  Continue Aricept 5 mg q.h.s. for dementia.  9.  Os-Cal 500 mg b.i.d. for osteoporosis.  10. Continue Urecholine 25 mg t.i.d. for urinary retention.  11. Add Lyrica 75 mg daily for neuropathic pain reported last night      including burning pain of the left foot.   PROGNOSIS:  Good.   ESTIMATED LENGTH OF STAY:  5-10 days.   GOALS:  Modified independent, ADLs, transfers, and ambulation.           ______________________________  Ellwood Dense, M.D.     DC/MEDQ  D:  01/12/2006  T:  01/12/2006  Job:  [id number]

## 2011-03-25 NOTE — Discharge Summary (Signed)
NAMEMARIKA, Alexis York               ACCOUNT NO.:  [id number]   MEDICAL RECORD NO.:  [id number]          PATIENT TYPE:  ORB   LOCATION:  4532                         FACILITY:  [facility]   PHYSICIAN:  Ranelle Oyster, M.D.DATE OF BIRTH:  06-02-1922   DATE OF ADMISSION:  01/12/2006  DATE OF DISCHARGE:  01/19/2006                                 DISCHARGE SUMMARY   DISCHARGE DIAGNOSES:  1.  Left total hip replacement January 09, 2006, secondary to osteoarthritis.  2.  Pain management.  3.  Subcutaneous Arixtra for deep vein thrombosis prophylaxis.  4.  Postoperative anemia.  5.  Osteoporosis.  6.  Upper respiratory infection.  7.  Gastroesophageal reflux disease.  8.  History of right subtrochanteric femur fracture in January 2002.  9.  Left mastectomy.  10. Dementia.   This is an 77 year old female, history of right subtrochanteric femur  fracture in January 2002, who is now admitted January 09, 2006, with end-stage  changes of the left hip and no relief with conservative care.  Underwent a  left total hip arthroplasty March 5 per Dr. Priscille Kluver.  Placed on subcutaneous  Arixtra for deep vein thrombosis prophylaxis, weightbearing as tolerated.  Postoperative anemia, 8.6 and monitored.  Hospital course uneventful except  for some left leg and thigh discomfort, of which she did received a  prednisone taper.  She was admitted to subacute care services.   PAST MEDICAL HISTORY:  See discharge diagnoses.   Occasional alcohol.  Remote smoker.   ALLERGIES:  CODEINE, ERYTHROMYCIN and PLASTIC TAPE.   SOCIAL HISTORY:  Lives with husband in [place] and assistance is needed.   MEDICATIONS PRIOR TO ADMISSION:  Actonel, Aciphex, Celebrex, Aricept,  Urecholine and Os-Cal.   REHABILITATION HOSPITAL COURSE:  The patient was admitted to subacute care  services with therapies initiated daily consisting of physical therapy,  occupational therapy and rehabilitation nursing.  The following issues were  addressed during the patient's rehabilitation stay:  Pertaining to Ms.  Runnion's left total hip arthroplasty January 09, 2006, surgical site healing  nicely.  No signs of infection.  She was ambulating with a walker,  weightbearing as tolerated.  Plan was for home health nurse to remove  staples on March 19 and apply Steri-Strips.  Pain management ongoing with  the use of Vicodin and Robaxin.  She was on Lyrica, which was increased to  twice daily on March 14 secondary to some left thigh discomfort.  Venous  Doppler studies March 15 were negative for deep vein thrombosis.  She had  completed a course of Arixtra for deep vein thrombosis prophylaxis.  Postoperative anemia, again, was stable with latest hemoglobin of 10.4.  She  would remain on her Aciphex for gastroesophageal reflux disease.  During her  rehab stay she was treated with Cipro for an upper respiratory infection,  low-grade temperature.  Chest x-ray March 14 negative for any active  disease.  She remained on her Aricept for a history of dementia.  Overall,  functionally she was ambulating minimal assist 50 feet with a rolling  walker, minimal assist transfers, simple set-up upper body  activities of  daily living, contact guard assist lower body.  Home health therapies had  been arranged.  She was discharged to home in stable condition.   Discharge medications at the time of dictation included:  1.  Os-Cal 500 mg twice daily.  2.  Aricept 5 mg at bedtime.  3.  Trinsicon twice daily.  4.  Aciphex 20 mg daily.  5.  Lyrica 75 mg twice daily.  6.  Multivitamin daily.  7.  Vicodin as needed pain.  8.  Robaxin 500 mg every six hours as needed spasm.  9.  Cipro 250 mg twice daily x10 days.   Follow up with Dr. Priscille Kluver, orthopedic services, call for appointment.  Home  health therapies to remove staples, left hip, and apply Steri-Strips on  March 19.      Mariam Dollar, P.A.      Ranelle Oyster, M.D.  Electronically  Signed    DA/MEDQ  D:  01/19/2006  T:  01/21/2006  Job:  [id number]   cc:   Barry Dienes. Eloise Harman, M.D.  Fax: [phone]   John L. Rendall, M.D.  Fax: [phone]

## 2011-03-25 NOTE — Discharge Summary (Signed)
[facility]  Patient:    Alexis York, Alexis York                      MRN: [id number] Adm. Date:  [id number] Disc. Date: [id number] Attending:  Faith Rogue T Dictator:   Arnoldo Morale, P.A.-C.                           Discharge Summary  ADMITTING DIAGNOSES: 1. Right displaced subtrochanteric femur fracture. 2. Hypercholesterolemia. 3. Osteoarthritis, right hip. 4. Osteoporosis. 5. History of left breast cancer. 6. Depression.  DISCHARGE DIAGNOSES: 1. Right displaced subtrochanteric femur fracture. 2. Hypercholesterolemia. 3. Osteoarthritis, right hip. 4. Osteoporosis. 5. History of left breast cancer. 6. Depression. 7. Posthemorrhagic anemia. 8. Acute bronchitis. 9. Hypokalemia.  SURGICAL PROCEDURE:  On December 05, 2000, Alexis York underwent a right total hip arthroplasty with open reduction and internal fixation of her subtrochanteric femur fracture by Dr. Jonny Ruiz L. Rendall, assisted by Jamelle Rushing, P.A.-C.  COMPLICATIONS:  None.  CONSULTS: 1. Medicine consult by [facility], Dr. Gordy Savers, done on    December 08, 2000. 2. Pharmacy consult for Coumadin therapy, December 05, 2000. 3. Rehab medicine and physical therapy consult, December 06, 2000. 4. Case management and occupational therapy consult, December 07, 2000.  HISTORY OF PRESENT ILLNESS:  This 77 year old white female patient presented to the [facility] ER after she stepped on a slipper and fell landing on her right hip.  She complained of immediate severe pain in that hip and an inability to ambulate.  She had no other complaints.  She was brought to the emergency department and found to have a displaced right subtrochanteric femur fracture.  She had a history of osteoarthritis of the right hip and was scheduled for a right total hip in the upcoming months, so she was admitted to the hospital for fixation of the fracture and total hip replacement.  HOSPITAL  COURSE:  Alexis York was admitted on January 28 and placed in [place] traction.  On January 29, she underwent surgery without immediate complications.   She did have a productive cough on postoperative day #1 with yellow sputum and that was monitored.  T max was 100.4, vitals were stable. Right hip dressing was intact without drainage and leg was neurovascularly intact.  She was started on respiratory therapy with chest PT and nebulizer treatment and was to be touchdown weightbearing on the right leg.  On postoperative day #2, she continued with a productive cough.  She had some difficulty with urination and was started and p.o.s were pushed.  Hemoglobin was 9.4, hematocrit 26.2.  Her potassium was 3.3.  Hemoglobin and hematocrit were monitored and K-Dur was increased.  On postoperative day #3, her T max was 98.6, vitals were stable.  Hemoglobin was 9.1 with hematocrit of 26.8.  She had some signs of depression and was being started on Celexa.  With her hemoglobin drifting down, she was transfused with two units of packed red blood cells.  She was started on a Z-Pak for continued productive cough and low-grade temperature.  She remained stable and afebrile over the next several days.  On February 2, she continued with a productive cough and guaifenesin was added at that time. She was continued on the other hand-held nebulizers and the Z-Pak.  On December 11, 2000, she was doing better but had some complaints of mild nausea.  She was on medication for that.  T max was 98.  Vitals were stable. Right hip incision was well approximated with staples without drainage.  Still with productive cough, but the lungs were clear.  She was ready for transfer to the rehab unit and a bed became available that day and she was able to be transferred.  DISCHARGE INSTRUCTIONS: 1. She is to continue current medications and diet and physical therapy and    occupational therapy per the rehabilitation medicine  doctors. 2. K pad to the right hip and change the dressing every 8 hours, and clean    with Betadine.  Monitor the drainage. 3. She is to have her staples removed at postoperative day #14 with    Steri-Strips with Benzoin applied.  If she is still hospitalized, it can be    done at rehabilitation.  Then, she needs follow-up with Dr. Priscille Kluver in two    weeks.  Otherwise, she needs to follow up with Dr. Priscille Kluver at about    postoperative day #14. 4. She is to continue touchdown weightbearing on the right hip with the use of    the walker.  LABORATORY DATA:  On December 04, 2000, white count 11.2.  On January 30, hemoglobin 10.6, hematocrit 30.8.  On January 31, hemoglobin 9.4, hematocrit 26.2.  On 2/1, white count 10.6, hemoglobin 9.1, hematocrit 26.8.  On February 3, white count 7.3, hemoglobin 11.1, hematocrit 32.9, and platelets 334.  On December 04, 2000, PT 12.2, INR 0.9, and PTT 27.  On February 4, PT 30.5, INR 4.6.  On January 28, glucose 148.  On January 30, glucose 130, BUN less than 5, calcium 7.2.  On January 31, sodium 136, potassium 3.3, glucose 134, calcium 7.4.  On December 10, 2000, at 1:04, sodium 135, potassium 3.4, glucose 163, BUN less than 5, creatinine 0.5, and calcium 7.8.  On January 28, ALT of 46.  Urinalysis on January 28 showed straw-colored urine, 15 mg per dl of ketones, and all other indices within normal limits.  All other laboratory studies were within normal limits.  On January 28, x-ray taken of her right hip showed a comminuted intertrochanteric fracture with varus deformity.  Chest x-ray was negative for active disease.  X-ray done on December 05, 2000, of the pelvis showed the right total hip replacement in good position and alignment with the metallic fixation device affixing the greater trochanter in place.  There is an oblique fracture of the proximal right subtrochanteric region noted.  On December 10, 2000, chest x-ray showed mild right basilar  atelectasis. DD:  12/19/00 TD:  12/19/00 Job: [id number]

## 2011-04-08 ENCOUNTER — Emergency Department (HOSPITAL_COMMUNITY): Payer: Medicare Other

## 2011-04-08 ENCOUNTER — Inpatient Hospital Stay (HOSPITAL_COMMUNITY)
Admission: EM | Admit: 2011-04-08 | Discharge: 2011-04-12 | DRG: 481 | Disposition: A | Discharge status: Skilled Nursing Facility | Payer: Medicare Other | Attending: Otolaryngology | Admitting: Otolaryngology

## 2011-04-08 DIAGNOSIS — Z853 Personal history of malignant neoplasm of breast: Secondary | ICD-10-CM

## 2011-04-08 DIAGNOSIS — S7223XA Displaced subtrochanteric fracture of unspecified femur, initial encounter for closed fracture: Principal | ICD-10-CM | POA: Diagnosis present

## 2011-04-08 DIAGNOSIS — Y998 Other external cause status: Secondary | ICD-10-CM

## 2011-04-08 DIAGNOSIS — Y93K9 Activity, other involving animal care: Secondary | ICD-10-CM

## 2011-04-08 DIAGNOSIS — Y92009 Unspecified place in unspecified non-institutional (private) residence as the place of occurrence of the external cause: Secondary | ICD-10-CM

## 2011-04-08 DIAGNOSIS — F068 Other specified mental disorders due to known physiological condition: Secondary | ICD-10-CM | POA: Diagnosis present

## 2011-04-08 DIAGNOSIS — K219 Gastro-esophageal reflux disease without esophagitis: Secondary | ICD-10-CM | POA: Diagnosis present

## 2011-04-08 DIAGNOSIS — Z79899 Other long term (current) drug therapy: Secondary | ICD-10-CM

## 2011-04-08 DIAGNOSIS — Z96649 Presence of unspecified artificial hip joint: Secondary | ICD-10-CM

## 2011-04-08 DIAGNOSIS — E7800 Pure hypercholesterolemia, unspecified: Secondary | ICD-10-CM | POA: Diagnosis present

## 2011-04-08 DIAGNOSIS — D62 Acute posthemorrhagic anemia: Secondary | ICD-10-CM | POA: Diagnosis not present

## 2011-04-08 DIAGNOSIS — W19XXXA Unspecified fall, initial encounter: Secondary | ICD-10-CM | POA: Diagnosis present

## 2011-04-08 LAB — COMPREHENSIVE METABOLIC PANEL
ALT: 10 U/L (ref 0–35)
Alkaline Phosphatase: 103 U/L (ref 39–117)
BUN: 16 mg/dL (ref 6–23)
Chloride: 101 mEq/L (ref 96–112)
Glucose, Bld: 112 mg/dL — ABNORMAL HIGH (ref 70–99)
Potassium: 3.6 mEq/L (ref 3.5–5.1)
Sodium: 138 mEq/L (ref 135–145)
Total Bilirubin: 0.6 mg/dL (ref 0.3–1.2)

## 2011-04-08 LAB — DIFFERENTIAL
Lymphocytes Relative: 9 % — ABNORMAL LOW (ref 12–46)
Lymphs Abs: 1.2 10*3/uL (ref 0.7–4.0)
Monocytes Absolute: 0.6 10*3/uL (ref 0.1–1.0)
Monocytes Relative: 4 % (ref 3–12)
Neutro Abs: 12.3 10*3/uL — ABNORMAL HIGH (ref 1.7–7.7)

## 2011-04-08 LAB — URINALYSIS, ROUTINE W REFLEX MICROSCOPIC
Bilirubin Urine: NEGATIVE
Glucose, UA: NEGATIVE mg/dL
Hgb urine dipstick: NEGATIVE
Specific Gravity, Urine: 1.018 (ref 1.005–1.030)
Urobilinogen, UA: 1 mg/dL (ref 0.0–1.0)

## 2011-04-08 LAB — PROTIME-INR
INR: 0.96 (ref 0.00–1.49)
Prothrombin Time: 13 seconds (ref 11.6–15.2)

## 2011-04-08 LAB — CBC
HCT: 39.2 % (ref 36.0–46.0)
Hemoglobin: 12.9 g/dL (ref 12.0–15.0)
MCH: 29.2 pg (ref 26.0–34.0)
MCHC: 32.9 g/dL (ref 30.0–36.0)
MCV: 88.7 fL (ref 78.0–100.0)

## 2011-04-09 ENCOUNTER — Inpatient Hospital Stay (HOSPITAL_COMMUNITY): Payer: Medicare Other

## 2011-04-09 LAB — SURGICAL PCR SCREEN: Staphylococcus aureus: NEGATIVE

## 2011-04-10 LAB — BASIC METABOLIC PANEL
CO2: 27 mEq/L (ref 19–32)
Chloride: 105 mEq/L (ref 96–112)
GFR calc Af Amer: >60 mL/min (ref >60)
Glucose, Bld: 111 mg/dL — ABNORMAL HIGH (ref 70–99)
Sodium: 138 mEq/L (ref 135–145)

## 2011-04-10 LAB — CBC
Hemoglobin: 8.2 g/dL — ABNORMAL LOW (ref 12.0–15.0)
MCH: 29.2 pg (ref 26.0–34.0)
MCHC: 32.8 g/dL (ref 30.0–36.0)
MCV: 89 fL (ref 78.0–100.0)
RBC: 2.81 MIL/uL — ABNORMAL LOW (ref 3.87–5.11)

## 2011-04-10 NOTE — H&P (Signed)
NAMECHRISTYN, Alexis York               ACCOUNT NO.:  [id number]  MEDICAL RECORD NO.:  [id number]           PATIENT TYPE:  I  LOCATION:  1513                         [facility]ACILITY[facility]  [facility]  PHYSICIAN:  Eulas Post, MD    DATE OF BIRTH:  1922/05/20  DATE OF ADMISSION:  04/08/2011 DATE OF DISCHARGE:                             HISTORY & PHYSICAL   CHIEF COMPLAINT:  Right leg pain.  HISTORY:  Ms. Alexis York is an 77 year old woman who had a right total hip replacement done by Dr. Brynda Greathouse back in 2002 approximately, who was squatting down today, playing with the dogs, and basically just fell.  She had acute-onset right leg pain and was unable to walk.  Pain medications have made it better, but trying to move it makes it worse. It is located around the right thigh.  She had no loss of consciousness in the accident.  She had no other injuries.  She had no preexisting pain in that leg.  She normally walks with either a walker or a cane and uses a wheelchair for extended walking.  She lives at home with her husband.  PAST MEDICAL HISTORY: 1. Benign breast cancer. 2. Right subtrochanteric femur fracture in 2002, treated with a total     hip. 3. Left total hip replacement. 4. Mild dementia. 5. History of a left wrist fracture.  FAMILY HISTORY:  According to the chart review is positive for cancer as well as cardiac disease, although she herself says that there is no history of cardiac disease in her family, and says there was also no history of diabetes.  SOCIAL HISTORY:  She states she is a nonsmoker, and lives with her husband, although the chart indicates that she used to smoke.  REVIEW OF SYSTEMS:  Complete review of systems was performed and was otherwise negative with the exception of the musculoskeletal complaints as above.  PHYSICAL EXAMINATION:  CONSTITUTIONAL:  She is comfortable in bed, in no acute distress.  She is well developed, well nourished. EYE:  Extraocular  movements are intact. LYMPHATIC:  She has no cervical or axillary lymphadenopathy. CARDIOVASCULAR:  She has intact dorsalis pedis pulses in both of  her feet and no significant pedal edema. RESPIRATORY:  She has no cyanosis and no labored breathing. GI:  Her abdomen is soft, nontender with no rebound or guarding and no organomegaly. PSYCHIATRIC:  She does have dementia and is mildly confused, although appropriate with me throughout the interaction.  Her husband is her primary caretaker and is present with her at the bedside. SKIN:  She has no skin breakdown over the right leg.  Her surgical wounds from her previous total hip replacement have healed well. NEUROLOGIC:  Sensation seems to be intact throughout the right lower extremity.  EHL and FHL are firing.  MUSCULOSKELETAL:  Her right leg is internally rotated, and she has pain to palpation around the right thigh.  The rest of her leg is nontender.  IMAGING STUDIES:  X-rays, two views of the right femur demonstrate evidence for a spiral femur fracture that is below the level of the prosthesis.  The prosthesis  itself appears to be well fixed.  LABORATORY DATA:  Her hemoglobin is 12 and her INR is 0.96 and her chemistry exams are essentially normal.  IMPRESSION:  Right femur fracture, below a total hip arthroplasty.  PLAN:  This is an acute severe problem, which is probably going to cause severe limitations of ambulatory function at least in the short term, if not the long term.  She carries significant risks both from a morbidity as well as a mortality standpoint.  I have discussed the options with Alexis York as well as her husband, and I have recommended open reduction and internal fixation.  She will likely need blood transfusion during the course of all this.  The risks, benefits, and alternatives were discussed including the option of nonoperative treatment versus surgical intervention, carrying the risks of infection, bleeding,  nerve injury, malunion, nonunion, hardware prominence, hardware failure, recurrent fracture, blood clots, cardiopulmonary complications, inability to regain ambulatory function among others and they are willing to proceed. She will plan to be admitted to the hospital and we will probably organize proceeding with surgical intervention in the morning.     Eulas Post, MD     JPL/MEDQ  D:  04/08/2011  T:  04/09/2011  Job:  [id number]  cc:   Dr. Mardene Sayer  Electronically Signed by Teryl Lucy MD on 04/10/2011 04:47:33 PM

## 2011-04-10 NOTE — Op Note (Signed)
[name], [name]               ACCOUNT NO.:  [id number]  MEDICAL RECORD NO.:  [id number]           PATIENT TYPE:  I  LOCATION:  0098                         FACILITY:  [facility]  PHYSICIAN:  Eulas Post, MD    DATE OF BIRTH:  [birth year]  DATE OF PROCEDURE:  04/09/2011 DATE OF DISCHARGE:                              OPERATIVE REPORT   ATTENDING SURGEON:  Eulas Post, MD  ASSISTANT:  Janace Litten, orthopedic PA-C, present and scrubbed assisting with exposure, retraction, reduction, positioning of hardware, and closure.  PREOPERATIVE DIAGNOSES:  Right supracondylar periprosthetic femur fracture.  POSTOPERATIVE DIAGNOSIS:  Right supracondylar periprosthetic femur fracture.  OPERATIVE PROCEDURE:  Open reduction and internal fixation of right supracondylar femur fracture with plating.  ANESTHESIA:  General.  ESTIMATED BLOOD LOSS:  700 cc.  OPERATIVE IMPLANTS:  Zimmer condylar locking plate with multiple distal locking screws, one interfragmentary lag screw, multiple proximal locking screws as well as 3 cerclage wires around the proximal femur around the femoral stem.  PREOPERATIVE INDICATIONS:  Alexis York is an 77 year old woman who has had remote history of a right subtrochanteric femur fracture that had a total hip replacement and this was done back in around 2002.  She was bending over and fell and broke her right supracondylar femur.  She is admitted to hospital and optimized and went to surgery following day. The risks, benefits and alternatives were discussed before the procedure including but not limited to risks of infection, bleeding, nerve injury, malunion, nonunion, hardware prominence, hardware failure, need for hardware removal, blood clots, loss of ambulatory function, cardiopulmonary complications among others and she is willing to proceed.  OPERATIVE PROCEDURE:  The patient was brought to the operating room and placed in supine position.  IV  antibiotics were given.  General anesthesia was administered.  The right lower extremity was prepped and draped in the usual sterile fashion.  A large lateral incision was made and iliotibial band was released and the vastus lateralis was reflected anteriorly.  Adequate hemostasis was achieved.  The fracture site was identified and exposed and reduced and held anatomically with a clamp. I then placed a single front to back interfragmentary lag screw.  The segment that I lagged into did have some comminution, however, was holding reasonably well, such that the overall femur was restored in length and alignment.  Then I applied the appropriate length femoral plate.  First I secured it provisionally with K-wires and confirmed the reduction as well as the position of the plate on AP and lateral views and then I placed a nonlocking cancellous screw through the plate distally to deliver the plate down to the bone.  She had a previous subtrochanteric femur fracture proximally, such that there was some callus which I tried to smoothen out proximally in order to get the plate to sit down appropriately and not be pushed up off the bone.  I then placed cortical screw proximally to secure the plate.  This provided excellent fixation.  I then locked the plate both distally and proximally with multiple interlocking screws.  I took care not to overstiffen the construct.  Once I had completed the fixation, I then went further proximally and placed a total of 3 cerclage wires around the femur.  The crimper was applied and this crimped down to approximately 50 pounds of pressure.  The wires were cut and then final C-arm pictures were taken.  I confirmed that I had good clean access around the shaft with all the wires to minimize the risk for injury to the neurovascular structures.  Once I had completed the fixation, I took final C-arm pictures, irrigated the wounds copiously with over 2 L of fluid and  repaired the fascia with #1 Vicryl followed by 2-0 for subcutaneous tissue and Monocryl and Steri-Strips and sterile gauze. She was placed in a soft dressing.  She was awakened and returned to PACU in stable and satisfactory condition.  There no complications.  She tolerated the procedure well.  I do believe the Anesthesia gave her 1 unit of packed red blood cells during the operation but I will check the anesthesia records to be sure.     Eulas Post, MD     JPL/MEDQ  D:  04/09/2011  T:  04/09/2011  Job:  [id number]  Electronically Signed by Teryl Lucy MD on 04/10/2011 04:47:35 PM

## 2011-04-11 LAB — BASIC METABOLIC PANEL
CO2: 28 mEq/L (ref 19–32)
Chloride: 105 mEq/L (ref 96–112)
Glucose, Bld: 99 mg/dL (ref 70–99)
Potassium: 3.7 mEq/L (ref 3.5–5.1)
Sodium: 138 mEq/L (ref 135–145)

## 2011-04-11 LAB — CBC
HCT: 30.8 % — ABNORMAL LOW (ref 36.0–46.0)
Hemoglobin: 10.2 g/dL — ABNORMAL LOW (ref 12.0–15.0)
MCH: 29.6 pg (ref 26.0–34.0)
MCHC: 33.1 g/dL (ref 30.0–36.0)
MCV: 89.3 fL (ref 78.0–100.0)
RBC: 3.45 MIL/uL — ABNORMAL LOW (ref 3.87–5.11)

## 2011-04-11 LAB — POCT I-STAT 4, (NA,K, GLUC, HGB,HCT)
Glucose, Bld: 135 mg/dL — ABNORMAL HIGH (ref 70–99)
HCT: 33 % — ABNORMAL LOW (ref 36.0–46.0)
Hemoglobin: 11.2 g/dL — ABNORMAL LOW (ref 12.0–15.0)

## 2011-04-12 LAB — BASIC METABOLIC PANEL
BUN: 8 mg/dL (ref 6–23)
Calcium: 8 mg/dL — ABNORMAL LOW (ref 8.4–10.5)
Glucose, Bld: 91 mg/dL (ref 70–99)
Sodium: 139 mEq/L (ref 135–145)

## 2011-04-12 LAB — CBC
MCH: 29.9 pg (ref 26.0–34.0)
Platelets: 175 10*3/uL (ref 150–400)
RBC: 3.38 MIL/uL — ABNORMAL LOW (ref 3.87–5.11)
WBC: 8.1 10*3/uL (ref 4.0–10.5)

## 2011-04-12 LAB — PROTIME-INR
INR: 2.41 — ABNORMAL HIGH (ref 0.00–1.49)
Prothrombin Time: 26.4 seconds — ABNORMAL HIGH (ref 11.6–15.2)

## 2011-04-12 NOTE — Discharge Summary (Signed)
  NAMEMICHAEL, [name]               ACCOUNT NO.:  [id number]  MEDICAL RECORD NO.:  [id number]  LOCATION:  1607                         FACILITY:  [facility]  PHYSICIAN:  Eulas Post, MD    DATE OF BIRTH:  February 23, 1922  DATE OF ADMISSION:  04/08/2011 DATE OF DISCHARGE:  04/11/2011                        DISCHARGE SUMMARY - REFERRING   ADMISSION DIAGNOSIS:  Right periprosthetic femur fracture.  DISCHARGE DIAGNOSES: 1. Right supracondylar periprosthetic femur fracture. 2. Acute postoperative blood loss anemia.  ADDITIONAL DIAGNOSIS:  Includes: 1. History of benign right breast cancer. 2. History of left total hip replacement. 3. Mild dementia. 4. History of a left wrist fracture. 5. History of a right subtrochanteric femur fracture in 2002, treated     with a total hip.  HOSPITAL COURSE:  Ms. Alexis York is an 77 year old woman who had a right distal femur fracture that extended up near her total hip replacement.  She fell at home while playing with her dogs and was admitted to the hospital.  She was optimized and then went to surgery for right femur supracondylar internal fixation.  After surgical fixation, she was given perioperative antibiotics for antimicrobial prophylaxis.  She was given heel protectors as well as decubitus precautions.  She also had sequential compression devices and Lovenox as well as Coumadin for DVT prophylaxis.  Her INR was 1.75 on the day of discharge and she is planned to be having a goal INR of 2 to 3.  She is nonweightbearing on the right lower extremity and will plan to follow up with me in approximately 2 weeks.  There were no complications and she benefited maximally from her hospital stay.  She did have acute postoperative blood loss anemia and was given 2 units of packed red blood cells and her hemoglobin corrected appropriately.  She will plan to follow up with me in 2 weeks.  There were no complications.     Eulas Post,  MD     JPL/MEDQ  D:  04/11/2011  T:  04/11/2011  Job:  [id number]  Electronically Signed by Teryl Lucy MD on 04/12/2011 07:29:37 AM

## 2011-04-13 LAB — CROSSMATCH
ABO/RH(D): O POS
Antibody Screen: NEGATIVE
Unit division: 0
Unit division: 0
Unit division: 0

## 2011-11-14 DIAGNOSIS — R269 Unspecified abnormalities of gait and mobility: Secondary | ICD-10-CM | POA: Diagnosis not present

## 2011-11-14 DIAGNOSIS — Z9010 Acquired absence of unspecified breast and nipple: Secondary | ICD-10-CM | POA: Diagnosis not present

## 2011-11-14 DIAGNOSIS — F0390 Unspecified dementia without behavioral disturbance: Secondary | ICD-10-CM | POA: Diagnosis not present

## 2011-12-12 DIAGNOSIS — M25559 Pain in unspecified hip: Secondary | ICD-10-CM | POA: Diagnosis not present

## 2011-12-12 DIAGNOSIS — M545 Low back pain: Secondary | ICD-10-CM | POA: Diagnosis not present

## 2011-12-13 ENCOUNTER — Other Ambulatory Visit: Payer: Self-pay | Admitting: Orthopaedic Surgery

## 2011-12-13 DIAGNOSIS — R52 Pain, unspecified: Secondary | ICD-10-CM

## 2011-12-14 ENCOUNTER — Ambulatory Visit
Admission: RE | Admit: 2011-12-14 | Discharge: 2011-12-14 | Disposition: A | Discharge status: Home / Self Care | Payer: Medicare Other | Source: Ambulatory Visit | Attending: Orthopaedic Surgery | Admitting: Orthopaedic Surgery

## 2011-12-14 DIAGNOSIS — M5137 Other intervertebral disc degeneration, lumbosacral region: Secondary | ICD-10-CM | POA: Diagnosis not present

## 2011-12-14 DIAGNOSIS — M47817 Spondylosis without myelopathy or radiculopathy, lumbosacral region: Secondary | ICD-10-CM | POA: Diagnosis not present

## 2011-12-14 DIAGNOSIS — R52 Pain, unspecified: Secondary | ICD-10-CM

## 2011-12-14 DIAGNOSIS — M8448XA Pathological fracture, other site, initial encounter for fracture: Secondary | ICD-10-CM | POA: Diagnosis not present

## 2011-12-15 ENCOUNTER — Ambulatory Visit
Admission: RE | Admit: 2011-12-15 | Discharge: 2011-12-15 | Disposition: A | Discharge status: Home / Self Care | Payer: Medicare Other | Source: Ambulatory Visit | Attending: Orthopaedic Surgery | Admitting: Orthopaedic Surgery

## 2011-12-15 ENCOUNTER — Other Ambulatory Visit: Payer: Self-pay | Admitting: Orthopaedic Surgery

## 2011-12-15 DIAGNOSIS — M8448XA Pathological fracture, other site, initial encounter for fracture: Secondary | ICD-10-CM | POA: Diagnosis not present

## 2011-12-15 DIAGNOSIS — R609 Edema, unspecified: Secondary | ICD-10-CM

## 2011-12-15 DIAGNOSIS — Z853 Personal history of malignant neoplasm of breast: Secondary | ICD-10-CM | POA: Diagnosis not present

## 2011-12-15 DIAGNOSIS — IMO0002 Reserved for concepts with insufficient information to code with codable children: Secondary | ICD-10-CM | POA: Diagnosis not present

## 2011-12-16 ENCOUNTER — Other Ambulatory Visit: Payer: Self-pay | Admitting: Orthopaedic Surgery

## 2011-12-16 DIAGNOSIS — IMO0002 Reserved for concepts with insufficient information to code with codable children: Secondary | ICD-10-CM

## 2011-12-19 ENCOUNTER — Ambulatory Visit
Admission: RE | Admit: 2011-12-19 | Discharge: 2011-12-19 | Disposition: A | Discharge status: Home / Self Care | Payer: Medicare Other | Source: Ambulatory Visit | Attending: Orthopaedic Surgery | Admitting: Orthopaedic Surgery

## 2011-12-19 ENCOUNTER — Telehealth (HOSPITAL_COMMUNITY): Payer: Self-pay

## 2011-12-19 ENCOUNTER — Other Ambulatory Visit: Payer: Self-pay | Admitting: Orthopaedic Surgery

## 2011-12-19 DIAGNOSIS — IMO0002 Reserved for concepts with insufficient information to code with codable children: Secondary | ICD-10-CM

## 2011-12-19 DIAGNOSIS — M8448XA Pathological fracture, other site, initial encounter for fracture: Secondary | ICD-10-CM | POA: Diagnosis not present

## 2011-12-19 DIAGNOSIS — M549 Dorsalgia, unspecified: Secondary | ICD-10-CM | POA: Diagnosis not present

## 2011-12-19 NOTE — Consult Note (Signed)
  Alexis York is an 78 yo female for consideration of VP for painful T10 compression fx.  She felt pain 10 days ago getting out of bed.  No fall.  Subsequent mri showed recent benign appearing T10 fracture.  No retropulsion.  20% loss of height.  Old treated fracture at T12.  Her pain is severe.  20/24 Roland Morris questions positive.  Can not tolerate narcotics due to constipation and mental status changes.  Taking Celebrex and Tylenol without good relief.  2 years ago she had VP for a T12 fx, and left the facility with complete relief of pain.  Pt is in wheelchair.  Painful to palpation in lower thoracic region.  Has equal strength in legs and preserved sensation.  Had 20 minute discussion with husband and patient.  They understand the importance of pursuing treatment of her underlying osteoporosis.  Procedure of VP discussed with likelihood of pain relief of 80% or greater.  Possible complications of hemorrhage, infection, nerve damage, cement extrusion, and fracture were discussed.  I think she is an appropriate candidate for treatment.  They want treatment as soon as at all possible.  I have discussed the case with Dr. Benard Rink, and our plan is for him to perform this tomorrow morning.

## 2011-12-20 ENCOUNTER — Ambulatory Visit
Admission: RE | Admit: 2011-12-20 | Discharge: 2011-12-20 | Disposition: A | Discharge status: Home / Self Care | Payer: Medicare Other | Source: Ambulatory Visit | Attending: Orthopaedic Surgery | Admitting: Orthopaedic Surgery

## 2011-12-20 DIAGNOSIS — M8448XA Pathological fracture, other site, initial encounter for fracture: Secondary | ICD-10-CM | POA: Diagnosis not present

## 2011-12-20 DIAGNOSIS — IMO0002 Reserved for concepts with insufficient information to code with codable children: Secondary | ICD-10-CM

## 2011-12-20 DIAGNOSIS — S22000A Wedge compression fracture of unspecified thoracic vertebra, initial encounter for closed fracture: Secondary | ICD-10-CM

## 2011-12-20 HISTORY — DX: Malignant (primary) neoplasm, unspecified: C80.1

## 2011-12-20 MED ORDER — SODIUM CHLORIDE 0.9 % IV SOLN
Freq: Once | INTRAVENOUS | Status: AC
  Administered 2011-12-20: 08:00:00 via INTRAVENOUS

## 2011-12-20 MED ORDER — CEFAZOLIN SODIUM 1-5 GM-% IV SOLN
1.0000 g | Freq: Once | INTRAVENOUS | Status: AC
  Administered 2011-12-20: 1 g via INTRAVENOUS

## 2011-12-20 MED ORDER — MIDAZOLAM HCL 2 MG/2ML IJ SOLN
1.0000 mg | INTRAMUSCULAR | Status: DC | PRN
  Administered 2011-12-20 (×2): 1 mg via INTRAVENOUS

## 2011-12-20 MED ORDER — FENTANYL CITRATE 0.05 MG/ML IJ SOLN
25.0000 ug | INTRAMUSCULAR | Status: DC | PRN
  Administered 2011-12-20 (×2): 50 ug via INTRAVENOUS

## 2011-12-20 MED ORDER — KETOROLAC TROMETHAMINE 30 MG/ML IJ SOLN
30.0000 mg | Freq: Once | INTRAMUSCULAR | Status: AC
  Administered 2011-12-20: 30 mg via INTRAVENOUS

## 2011-12-20 MED ORDER — ONDANSETRON HCL 4 MG/2ML IJ SOLN: 4.0000 mg | Freq: Once | INTRAMUSCULAR | Status: DC

## 2011-12-20 NOTE — Progress Notes (Addendum)
Explained discharge instructions to pt, consent will be signed and IV will be started. Lungs clear bilaterally and heart sounds regular.  0930 dr. Benard Rink in to visit with pt and her husband.

## 2012-01-18 DIAGNOSIS — R413 Other amnesia: Secondary | ICD-10-CM | POA: Diagnosis not present

## 2012-01-18 DIAGNOSIS — E559 Vitamin D deficiency, unspecified: Secondary | ICD-10-CM | POA: Diagnosis not present

## 2012-01-18 DIAGNOSIS — M810 Age-related osteoporosis without current pathological fracture: Secondary | ICD-10-CM | POA: Diagnosis not present

## 2012-01-24 NOTE — Telephone Encounter (Signed)
Contacts         Type  Contact  Phone    12/19/2011 10:13 AM  Phone ([address]  Alexis York, Alexis York (Self)  [phone] (H)    Left Message- left message to schedule an IR consult for compression fracture T10

## 2012-02-16 ENCOUNTER — Encounter (HOSPITAL_COMMUNITY)
Admission: RE | Admit: 2012-02-16 | Discharge: 2012-02-16 | Disposition: A | Discharge status: Home / Self Care | Payer: Medicare Other | Source: Ambulatory Visit | Attending: Internal Medicine | Admitting: Internal Medicine

## 2012-02-16 ENCOUNTER — Encounter (HOSPITAL_COMMUNITY): Payer: Self-pay

## 2012-02-16 DIAGNOSIS — M810 Age-related osteoporosis without current pathological fracture: Secondary | ICD-10-CM | POA: Insufficient documentation

## 2012-02-16 HISTORY — DX: Gastro-esophageal reflux disease without esophagitis: K21.9

## 2012-02-16 HISTORY — DX: Other amnesia: R41.3

## 2012-02-16 HISTORY — DX: Pure hypercholesterolemia, unspecified: E78.00

## 2012-02-16 HISTORY — DX: Unspecified osteoarthritis, unspecified site: M19.90

## 2012-02-16 MED ORDER — SODIUM CHLORIDE 0.9 % IV SOLN
INTRAVENOUS | Status: AC
  Administered 2012-02-16: 250 mL via INTRAVENOUS

## 2012-02-16 MED ORDER — ZOLEDRONIC ACID 5 MG/100ML IV SOLN
5.0000 mg | Freq: Once | INTRAVENOUS | Status: AC
  Administered 2012-02-16: 5 mg via INTRAVENOUS
  Filled 2012-02-16: qty 100

## 2012-02-16 NOTE — Discharge Instructions (Signed)
Drink fluids/water as tolerated over next 72hrs Tylenol or Ibuprofen OTC as directed Continue calcium and Vit D as directed by your MD 

## 2012-07-30 DIAGNOSIS — H2510 Age-related nuclear cataract, unspecified eye: Secondary | ICD-10-CM | POA: Diagnosis not present

## 2012-07-30 DIAGNOSIS — Z961 Presence of intraocular lens: Secondary | ICD-10-CM | POA: Diagnosis not present

## 2012-08-31 DIAGNOSIS — Z79899 Other long term (current) drug therapy: Secondary | ICD-10-CM | POA: Diagnosis not present

## 2012-08-31 DIAGNOSIS — R82998 Other abnormal findings in urine: Secondary | ICD-10-CM | POA: Diagnosis not present

## 2012-08-31 DIAGNOSIS — E782 Mixed hyperlipidemia: Secondary | ICD-10-CM | POA: Diagnosis not present

## 2012-08-31 DIAGNOSIS — M810 Age-related osteoporosis without current pathological fracture: Secondary | ICD-10-CM | POA: Diagnosis not present

## 2012-09-07 DIAGNOSIS — Z1331 Encounter for screening for depression: Secondary | ICD-10-CM | POA: Diagnosis not present

## 2012-09-07 DIAGNOSIS — R269 Unspecified abnormalities of gait and mobility: Secondary | ICD-10-CM | POA: Diagnosis not present

## 2012-09-07 DIAGNOSIS — Z0000 Encounter for general adult medical examination without abnormal findings: Secondary | ICD-10-CM | POA: Diagnosis not present

## 2012-09-07 DIAGNOSIS — E782 Mixed hyperlipidemia: Secondary | ICD-10-CM | POA: Diagnosis not present

## 2012-09-07 DIAGNOSIS — R0609 Other forms of dyspnea: Secondary | ICD-10-CM | POA: Diagnosis not present

## 2012-09-14 DIAGNOSIS — F0390 Unspecified dementia without behavioral disturbance: Secondary | ICD-10-CM | POA: Diagnosis not present

## 2012-09-14 DIAGNOSIS — R269 Unspecified abnormalities of gait and mobility: Secondary | ICD-10-CM | POA: Diagnosis not present

## 2013-01-16 DIAGNOSIS — E559 Vitamin D deficiency, unspecified: Secondary | ICD-10-CM | POA: Diagnosis not present

## 2013-01-16 DIAGNOSIS — Z79899 Other long term (current) drug therapy: Secondary | ICD-10-CM | POA: Diagnosis not present

## 2013-01-24 ENCOUNTER — Other Ambulatory Visit: Payer: Self-pay | Admitting: Neurology

## 2013-02-05 ENCOUNTER — Other Ambulatory Visit (HOSPITAL_COMMUNITY): Payer: Self-pay | Admitting: *Deleted

## 2013-02-06 ENCOUNTER — Ambulatory Visit (HOSPITAL_COMMUNITY): Admission: RE | Admit: 2013-02-06 | Payer: Medicare Other | Source: Ambulatory Visit

## 2013-02-06 DIAGNOSIS — Z79899 Other long term (current) drug therapy: Secondary | ICD-10-CM | POA: Diagnosis not present

## 2013-02-20 ENCOUNTER — Encounter (HOSPITAL_COMMUNITY): Payer: Self-pay

## 2013-02-20 ENCOUNTER — Ambulatory Visit (HOSPITAL_COMMUNITY)
Admission: RE | Admit: 2013-02-20 | Discharge: 2013-02-20 | Disposition: A | Discharge status: Home / Self Care | Payer: Medicare Other | Source: Ambulatory Visit | Attending: Internal Medicine | Admitting: Internal Medicine

## 2013-02-20 DIAGNOSIS — M810 Age-related osteoporosis without current pathological fracture: Secondary | ICD-10-CM | POA: Diagnosis not present

## 2013-02-20 MED ORDER — ZOLEDRONIC ACID 5 MG/100ML IV SOLN
5.0000 mg | Freq: Once | INTRAVENOUS | Status: AC
  Administered 2013-02-20: 5 mg via INTRAVENOUS
  Filled 2013-02-20: qty 100

## 2013-02-20 MED ORDER — SODIUM CHLORIDE 0.9 % IV SOLN
INTRAVENOUS | Status: AC
  Administered 2013-02-20: 15:00:00 via INTRAVENOUS

## 2013-03-08 ENCOUNTER — Ambulatory Visit: Payer: Self-pay | Admitting: Nurse Practitioner

## 2013-03-08 DIAGNOSIS — L578 Other skin changes due to chronic exposure to nonionizing radiation: Secondary | ICD-10-CM | POA: Diagnosis not present

## 2013-03-08 DIAGNOSIS — C44319 Basal cell carcinoma of skin of other parts of face: Secondary | ICD-10-CM | POA: Diagnosis not present

## 2013-03-08 DIAGNOSIS — Z85828 Personal history of other malignant neoplasm of skin: Secondary | ICD-10-CM | POA: Diagnosis not present

## 2013-03-08 DIAGNOSIS — D485 Neoplasm of uncertain behavior of skin: Secondary | ICD-10-CM | POA: Diagnosis not present

## 2013-03-13 DIAGNOSIS — M1710 Unilateral primary osteoarthritis, unspecified knee: Secondary | ICD-10-CM | POA: Diagnosis not present

## 2013-03-13 DIAGNOSIS — M25539 Pain in unspecified wrist: Secondary | ICD-10-CM | POA: Diagnosis not present

## 2013-04-04 DIAGNOSIS — C44319 Basal cell carcinoma of skin of other parts of face: Secondary | ICD-10-CM | POA: Diagnosis not present

## 2013-04-25 DIAGNOSIS — G4710 Hypersomnia, unspecified: Secondary | ICD-10-CM | POA: Diagnosis not present

## 2013-04-25 DIAGNOSIS — Z6827 Body mass index (BMI) 27.0-27.9, adult: Secondary | ICD-10-CM | POA: Diagnosis not present

## 2013-04-25 DIAGNOSIS — R5383 Other fatigue: Secondary | ICD-10-CM | POA: Diagnosis not present

## 2013-04-25 DIAGNOSIS — E559 Vitamin D deficiency, unspecified: Secondary | ICD-10-CM | POA: Diagnosis not present

## 2013-04-30 ENCOUNTER — Ambulatory Visit (INDEPENDENT_AMBULATORY_CARE_PROVIDER_SITE_OTHER): Payer: Medicare Other | Admitting: Nurse Practitioner

## 2013-04-30 ENCOUNTER — Encounter: Payer: Self-pay | Admitting: Nurse Practitioner

## 2013-04-30 VITALS — BP 132/75 | HR 86 | Ht 64.5 in | Wt 155.0 lb

## 2013-04-30 DIAGNOSIS — R269 Unspecified abnormalities of gait and mobility: Secondary | ICD-10-CM

## 2013-04-30 DIAGNOSIS — F0390 Unspecified dementia without behavioral disturbance: Secondary | ICD-10-CM | POA: Diagnosis not present

## 2013-04-30 NOTE — Patient Instructions (Addendum)
Continued Aricept 10 mg daily Continue Namenda XR 28 mg daily Mini-Mental Status score stable Followup with Dr. Vickey Huger in 6 months

## 2013-04-30 NOTE — Progress Notes (Signed)
HPI: Patient returns for followup after previous visit 09/14/2012. She is an 79 year old female that follows up for memory loss. She is currently on Aricept tolerating the medication without any side effects. She denies having any problems with getting her days and nights mixed up. She has had no wandering behavior. She gets no regular exercise and complains with muscle pain and difficulty walking due to the deconditioning.  She  has a history of osteoporosis and arthritis, she says she cannot ambulate further than 25 feet without getting short of breath  due to deconditioning. She has cut out junk food and lost 12 lbs.  She has frequent urination at night and has to get up several times, high cholesterol, and complains with difficulty falling asleep at night sometimes, not often.  She is ambulating with a walker, denies that she has had any falls, continues to do some cooking, no longer drives. She returns today with her husband who thinks her memory is about the same as when last seen. She was tried on Aricept 23mg  but had significant GI distres and went back to 10mg . Namenda was started at her last visit because she was more argumentative and her memory score has declined. Husband says today he thinks  memory is about the same. Appetite good, up once at night to the BR no wandering behavior.       ROS:  Memory loss, joint pain decreased energy  Physical Exam General: well developed, well nourished, seated, in no evident distress Head: head normocephalic and atraumatic. Oropharynx benign Neck: supple with no carotid  bruits Cardiovascular: regular rate and rhythm, no murmurs  Neurologic Exam Mental Status: Awake and alert. MMSE 19/30. GDS=3. Previous 20/30.Follows all commands. Speech and language normal.   Cranial Nerves: . Pupils equal, briskly reactive to light. Extraocular movements full without nystagmus. Visual fields full to confrontation. Hearing intact and symmetric to finger snap. Facial  sensation intact. Face, tongue, palate move normally and symmetrically. Neck flexion and extension normal.  Motor: Normal bulk and tone. Normal strength in all tested extremity muscles.No focal weakness Sensory.: intact to touch and pinprick and vibratory.  Coordination: Rapid alternating movements normal in all extremities. Finger-to-nose and heel-to-shin performed accurately bilaterally. Gait and Station: Arises from chair without difficulty. Stance is wide based. Ambulated 70 feet with her walker, no difficulty with turns.  Reflexes: 1+ and symmetric. Toes downgoing.     ASSESSMENT: Senile dementia, unsteady gait     PLAN: Continued Aricept 10 mg daily Continue Namenda XR 28 mg daily Mini-Mental Status score stable Use walker at all times for safe ambulation.  Followup with Dr. Vickey Huger in 6 months  Nilda Riggs, [place] APRN

## 2013-05-01 ENCOUNTER — Other Ambulatory Visit: Payer: Self-pay | Admitting: Nurse Practitioner

## 2013-07-29 ENCOUNTER — Telehealth: Payer: Self-pay | Admitting: Neurology

## 2013-07-29 MED ORDER — MEMANTINE HCL 10 MG PO TABS: 10.0000 mg | ORAL_TABLET | Freq: Two times a day (BID) | ORAL | Status: DC

## 2013-07-29 NOTE — Telephone Encounter (Signed)
Namenda XR is not available.  Sent Rx for Namenda 10mg  BID which is currently all that is available.

## 2013-09-09 DIAGNOSIS — E782 Mixed hyperlipidemia: Secondary | ICD-10-CM | POA: Diagnosis not present

## 2013-09-09 DIAGNOSIS — E559 Vitamin D deficiency, unspecified: Secondary | ICD-10-CM | POA: Diagnosis not present

## 2013-09-16 DIAGNOSIS — Z1331 Encounter for screening for depression: Secondary | ICD-10-CM | POA: Diagnosis not present

## 2013-09-16 DIAGNOSIS — R0609 Other forms of dyspnea: Secondary | ICD-10-CM | POA: Diagnosis not present

## 2013-09-16 DIAGNOSIS — E782 Mixed hyperlipidemia: Secondary | ICD-10-CM | POA: Diagnosis not present

## 2013-09-16 DIAGNOSIS — M545 Low back pain: Secondary | ICD-10-CM | POA: Diagnosis not present

## 2013-09-16 DIAGNOSIS — Z0000 Encounter for general adult medical examination without abnormal findings: Secondary | ICD-10-CM | POA: Diagnosis not present

## 2013-09-16 DIAGNOSIS — R82998 Other abnormal findings in urine: Secondary | ICD-10-CM | POA: Diagnosis not present

## 2013-09-16 DIAGNOSIS — Z79899 Other long term (current) drug therapy: Secondary | ICD-10-CM | POA: Diagnosis not present

## 2013-09-16 DIAGNOSIS — R269 Unspecified abnormalities of gait and mobility: Secondary | ICD-10-CM | POA: Diagnosis not present

## 2013-09-16 DIAGNOSIS — G4710 Hypersomnia, unspecified: Secondary | ICD-10-CM | POA: Diagnosis not present

## 2013-09-16 DIAGNOSIS — R350 Frequency of micturition: Secondary | ICD-10-CM | POA: Diagnosis not present

## 2013-09-16 DIAGNOSIS — R809 Proteinuria, unspecified: Secondary | ICD-10-CM | POA: Diagnosis not present

## 2013-09-18 DIAGNOSIS — M1710 Unilateral primary osteoarthritis, unspecified knee: Secondary | ICD-10-CM | POA: Diagnosis not present

## 2013-09-25 DIAGNOSIS — Z1212 Encounter for screening for malignant neoplasm of rectum: Secondary | ICD-10-CM | POA: Diagnosis not present

## 2013-10-23 ENCOUNTER — Encounter: Payer: Self-pay | Admitting: Neurology

## 2013-10-23 ENCOUNTER — Encounter (INDEPENDENT_AMBULATORY_CARE_PROVIDER_SITE_OTHER): Payer: Self-pay

## 2013-10-23 ENCOUNTER — Ambulatory Visit (INDEPENDENT_AMBULATORY_CARE_PROVIDER_SITE_OTHER): Payer: Medicare Other | Admitting: Neurology

## 2013-10-23 VITALS — BP 132/77 | HR 76 | Resp 16 | Ht 63.0 in | Wt 153.0 lb

## 2013-10-23 DIAGNOSIS — F0390 Unspecified dementia without behavioral disturbance: Secondary | ICD-10-CM | POA: Insufficient documentation

## 2013-10-23 DIAGNOSIS — R2689 Other abnormalities of gait and mobility: Secondary | ICD-10-CM

## 2013-10-23 DIAGNOSIS — F068 Other specified mental disorders due to known physiological condition: Secondary | ICD-10-CM

## 2013-10-23 DIAGNOSIS — R269 Unspecified abnormalities of gait and mobility: Secondary | ICD-10-CM

## 2013-10-23 MED ORDER — MEMANTINE HCL 10 MG PO TABS: 10.0000 mg | ORAL_TABLET | Freq: Two times a day (BID) | ORAL | Status: DC

## 2013-10-23 MED ORDER — DONEPEZIL HCL 10 MG PO TABS: 10.0000 mg | ORAL_TABLET | Freq: Every day | ORAL | Status: DC

## 2013-10-23 NOTE — Progress Notes (Signed)
[facility]  Provider:  Melvyn York, M D  Referring Provider: Jarome Matin, MD Primary Care Physician:  Alexis Fillers, MD  Chief Complaint  Patient presents with  . Dementia    MMSE Was performed in office today durring visit    HPI:  Alexis York is a 79 y.o. female  Is seen here as a 6 month revisit  from Dr. Eloise York for Dementia.   interval history : Alexis York has been followed in this office for about 7 years. The initial evaluation was already designated for memory loss. This is well as personality has not changed over the years that I have known her, and today his Mini-Mental status examination showed a remarkable 23  Out of 30 points. Visual spatial capacities seems to be impaired,  but she was able to perform multi-step commands, spell a word backwards,  repeat a sentence and name objects - she could not recall any of the 3 recall reports. She has been fatigued and sleepy for many years, and had a unremarkable ONO test many years ago. Her husband mentioned this to Dr. Eloise York and he recommended to speak to me about it.   Her GDS is endorsed at 5 points, Epworh and FSS were not obtained.  She has nocturia at least 3 times at night. No REM BD , nor sleep walking or talking were reported . No parkinsonism.  She feels not restored in the morning, her sleep is fragmented and she still feels as if she could nap all day long.   In addition she lost muscle in her fingers and her rings have gotten lose. 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  She is meanwhile 79 years old and awaiting a cataract surgery in the left eye. She does not longer drive, has not been bothered by the cataract.  She repeated herself several times.      Review of Systems: Out of a complete 14 system review, the patient complains of only the following symptoms, and all other reviewed systems are negative. Nocturia, EDS and memory  loss, multifactorial gait disorder.   History   Social History  . Marital Status: Married    Spouse Name: N/A    Number of Children: N/A  . Years of Education: N/A   Occupational History  . Not on file.   Social History Main Topics  . Smoking status: Former Smoker -- 1.00 packs/day for 22 years    Types: Cigarettes    Quit date: 12/20/1971  . Smokeless tobacco: Never Used  . Alcohol Use: No  . Drug Use: No  . Sexual Activity: Not on file   Other Topics Concern  . Not on file   Social History Narrative  . No narrative on file    Family History  Problem Relation Age of Onset  . Dementia Mother   . Cancer - Colon Mother   . Cancer - Ovarian Sister     Past Medical History  Diagnosis Date  . Cancer 1973    left mastectomy  . Osteoporosis   . Hypercholesteremia   . Arthritis   . Vitamin D deficiency   . Memory impairment   . GERD (gastroesophageal reflux disease)     Past Surgical History  Procedure Laterality Date  . Joint replacement  15 yr/10 yr ago    bilateral hip replacements  . Appendectomy      teenager  . Abdominal hysterectomy  1975  .  Breast surgery  1975    mastectomy    Current Outpatient Prescriptions  Medication Sig Dispense Refill  . aspirin 81 MG tablet Take 81 mg by mouth daily.      . bethanechol (URECHOLINE) 25 MG tablet Take 25 mg by mouth 3 (three) times daily.      . calcium-vitamin D (OSCAL WITH D) 500-200 MG-UNIT per tablet Take 1 tablet by mouth daily.      . celecoxib (CELEBREX) 200 MG capsule Take 200 mg by mouth 2 (two) times daily.      Marland Kitchen donepezil (ARICEPT) 10 MG tablet TAKE 1 TABLET DAILY  90 tablet  2  . esomeprazole (NEXIUM) 40 MG capsule Take 40 mg by mouth daily before breakfast.      . meloxicam (MOBIC) 15 MG tablet       . memantine (NAMENDA) 10 MG tablet Take 1 tablet (10 mg total) by mouth 2 (two) times daily.  180 tablet  0  . NAMENDA XR 28 MG CP24 TAKE 1 CAPSULE DAILY  90 capsule  1  . rosuvastatin (CRESTOR) 10  MG tablet Take 10 mg by mouth daily.      . Vitamin D, Ergocalciferol, (DRISDOL) 50000 UNITS CAPS Take 50,000 Units by mouth every 7 (seven) days.       No current facility-administered medications for this visit.    Allergies as of 10/23/2013 - Review Complete 10/23/2013  Allergen Reaction Noted  . Codeine Nausea And Vomiting 12/20/2011    Vitals: BP 132/77  Pulse 76  Resp 16  Ht 5\' 3"  (1.6 m)  Wt 153 lb (69.4 kg)  BMI 27.11 kg/m2 Last Weight:  Wt Readings from Last 1 Encounters:  10/23/13 153 lb (69.4 kg)   Last Height:   Ht Readings from Last 1 Encounters:  10/23/13 5\' 3"  (1.6 m)    Physical exam:  General: The patient is awake, alert and appears not in acute distress. The patient is well groomed. Head: Normocephalic, atraumatic. Neck is supple. Mallampati 2  neck circumference: 14,  Cardiovascular:  Regular rate and rhythm without  murmurs or carotid bruit, and without distended neck veins. Respiratory: Lungs are clear to auscultation. Skin:  Without evidence of edema, or rash. Trunk: normal posture.  Neurologic exam : The patient is awake and alert, oriented to place and time.  Memory :MMSE 23 out of 30 pints, more than the last time 19-30 . No MOCA .  There is a limited attention span & concentration ability.  Short term memory affected, she perseverates.Marland KitchenSpeech is fluent without dysarthria, dysphonia or aphasia.  Mood and affect are appropriate.  Cranial nerves: Pupils are equal and briskly reactive to light. Funduscopic exam , cataract left , post cataract right.   Extraocular movements  in vertical and horizontal planes intact and without nystagmus. Visual fields by finger perimetry are intact. Hearing to finger rub intact.  Facial sensation intact to fine touch. Facial motor strength is symmetric and tongue and uvula move midline.  Motor exam:  Normal tone , the patient has neither cogwheeling nor any rigidity in her upper extremity muscles, but nor did is a  shrinkage of the internal digital muscles the fingers especially the ring size now appears much too large for the patient. He reports no unintended weight loss or muscle pain. He has knee pain and the knees are swollen, arthritic.   Sensory:  Fine touch, pinprick and vibration were tested in all extremities.  Proprioception is tested in the upper extremities only.  This was  normal.  Coordination: Rapid alternating movements in the fingers/hands is tested and normal. Finger-to-nose maneuver tested and normal without evidence of ataxia, dysmetria or tremor.  Gait and station: Patient walks with a walker for  assistive device and is able with assistance, stool climb up to the exam table.  Strength is reduced, step width is small, her gait slight shuffling. Tandem gait is deferred. Spinal stenosis.  Deep tendon reflexes: in the  upper extremities are symmetric and intact. Lower extremities are  presenting without patella and with intact achilles tendon refex .  Babinski maneuver response is equivocal.  Assessment:  After physical and neurologic examination, review of laboratory studies, imaging, neurophysiology testing and pre-existing records, assessment is:  1) dementia , improved on MMSE 23-30  surprisingly higher score.   2)Multifactorial gait disorder.  3)EDS , expected in a patient with disrupted sleep, nocturia and arthritic pain.       Plan:  Treatment plan and additional workup : Cont. medication.  restrict naps,  Coffeine is allowed in AM.           HPI: Patient returns for followup after previous visit 09/14/2012. She is an 79 year old female that follows up for memory loss. She is currently on Aricept tolerating the medication without any side effects. She denies having any problems with getting her days and nights mixed up. She has had no wandering behavior. She gets no regular exercise and complains with muscle pain and difficulty walking due to the deconditioning.  She   has a history of osteoporosis and arthritis, she says she cannot ambulate further than 25 feet without getting short of breath  due to deconditioning. She has cut out junk food and lost 12 lbs.  She has frequent urination at night and has to get up several times, high cholesterol, and complains with difficulty falling asleep at night sometimes, not often.  She is ambulating with a walker, denies that she has had any falls, continues to do some cooking, no longer drives. She returns today with her husband who thinks her memory is about the same as when last seen. She was tried on Aricept 23mg  but had significant GI distres and went back to 10mg . Namenda was started at her last visit because she was more argumentative and her memory score has declined. Husband says today he thinks  memory is about the same. Appetite good, up once at night to the BR no wandering behavior.       ROS:  Memory loss, joint pain decreased energy  Physical Exam General: well developed, well nourished, seated, in no evident distress Head: head normocephalic and atraumatic. Oropharynx benign Neck: supple with no carotid  bruits Cardiovascular: regular rate and rhythm, no murmurs  Neurologic Exam Mental Status: Awake and alert. MMSE 19/30. GDS=3. Previous 20/30.Follows all commands. Speech and language normal.   Cranial Nerves: . Pupils equal, briskly reactive to light. Extraocular movements full without nystagmus. Visual fields full to confrontation. Hearing intact and symmetric to finger snap. Facial sensation intact. Face, tongue, palate move normally and symmetrically. Neck flexion and extension normal.  Motor: Normal bulk and tone. Normal strength in all tested extremity muscles.No focal weakness Sensory.: intact to touch and pinprick and vibratory.  Coordination: Rapid alternating movements normal in all extremities. Finger-to-nose and heel-to-shin performed accurately bilaterally. Gait and Station: Arises from chair  without difficulty. Stance is wide based. Ambulated 70 feet with her walker, no difficulty with turns.  Reflexes: 1+ and symmetric. Toes downgoing.  ASSESSMENT: Senile dementia, unsteady gait.  Alzheimer's type dementia.      PLAN: Continued Aricept 10 mg daily Continue Namenda XR 28 mg daily Mini-Mental Status score stable Use walker at all times for safe ambulation.  Followup with Nilda Riggs, GNP-BC APRN in 6 month .

## 2013-10-28 ENCOUNTER — Telehealth: Payer: Self-pay | Admitting: *Deleted

## 2013-10-28 NOTE — Telephone Encounter (Signed)
I called [facility].  Spoke with [employer].  She said the patient's spouse already called and cancelled the Rx's.  I called the patient back.  Spoke with Mr Gural.  He did call and try to cancel the orders but wasn't sure if the shipment was cancelled or not.  He is aware [facility] says order has been cancelled for Namenda and Aricept.

## 2013-11-23 ENCOUNTER — Other Ambulatory Visit: Payer: Self-pay | Admitting: Neurology

## 2014-01-28 DIAGNOSIS — M810 Age-related osteoporosis without current pathological fracture: Secondary | ICD-10-CM | POA: Diagnosis not present

## 2014-01-28 DIAGNOSIS — Z79899 Other long term (current) drug therapy: Secondary | ICD-10-CM | POA: Diagnosis not present

## 2014-02-21 ENCOUNTER — Other Ambulatory Visit: Payer: Self-pay | Admitting: Neurology

## 2014-02-21 MED ORDER — MEMANTINE HCL ER 28 MG PO CP24: 28.0000 mg | ORAL_CAPSULE | Freq: Every day | ORAL | Status: DC

## 2014-02-21 NOTE — Telephone Encounter (Signed)
Patient's husband calling to state that patient is having a hard time getting her [place] script, the pharmacy is out of stock. Patient's husband wants to get it at the local pharmacy but he needs a script first. Please call and advise patient's husband.

## 2014-02-21 NOTE — Telephone Encounter (Signed)
Alexis York at 02/21/2014 1:10 PM     Status: Signed        Patient's husband would like written Rx for Namenda #90 mailed to patient--does not want sent to pharmacy--thank you.

## 2014-02-21 NOTE — Telephone Encounter (Signed)
We will be happy to send the Rx to a local pharmacy, however, there is not one currently listed on the chart, so we do need to verify which pharmacy they use.  I called back, got no answer.  Left message.

## 2014-02-21 NOTE — Telephone Encounter (Signed)
Patient's husband would like written Rx for Namenda #90 mailed to patient--does not want sent to pharmacy--thank you.

## 2014-02-21 NOTE — Telephone Encounter (Signed)
Called pt and spoke with pt's husband Legrand Como informing him that the pt's Rx was being mailed out today. Husband verbalized understanding.

## 2014-02-25 ENCOUNTER — Other Ambulatory Visit (HOSPITAL_COMMUNITY): Payer: Self-pay | Admitting: Internal Medicine

## 2014-02-25 ENCOUNTER — Encounter (HOSPITAL_COMMUNITY): Payer: Self-pay

## 2014-02-25 ENCOUNTER — Ambulatory Visit (HOSPITAL_COMMUNITY)
Admission: RE | Admit: 2014-02-25 | Discharge: 2014-02-25 | Disposition: A | Discharge status: Home / Self Care | Payer: Medicare Other | Source: Ambulatory Visit | Attending: Internal Medicine | Admitting: Internal Medicine

## 2014-02-25 DIAGNOSIS — M810 Age-related osteoporosis without current pathological fracture: Secondary | ICD-10-CM | POA: Insufficient documentation

## 2014-02-25 MED ORDER — ZOLEDRONIC ACID 5 MG/100ML IV SOLN
5.0000 mg | Freq: Once | INTRAVENOUS | Status: AC
  Administered 2014-02-25: 5 mg via INTRAVENOUS
  Filled 2014-02-25: qty 100

## 2014-02-25 MED ORDER — SODIUM CHLORIDE 0.9 % IV SOLN
Freq: Once | INTRAVENOUS | Status: AC
  Administered 2014-02-25: 13:00:00 via INTRAVENOUS

## 2014-02-25 NOTE — Discharge Instructions (Signed)
RECLAST °Zoledronic Acid injection (Paget's Disease, Osteoporosis) °What is this medicine? °ZOLEDRONIC ACID (ZOE le dron ik AS id) lowers the amount of calcium loss from bone. It is used to treat Paget's disease and osteoporosis in women. °This medicine may be used for other purposes; ask your health care provider or pharmacist if you have questions. °COMMON BRAND NAME(S): Reclast, Zometa °What should I tell my health care provider before I take this medicine? °They need to know if you have any of these conditions: °-aspirin-sensitive asthma °-cancer, especially if you are receiving medicines used to treat cancer °-dental disease or wear dentures °-infection °-kidney disease °-low levels of calcium in the blood °-past surgery on the parathyroid gland or intestines °-receiving corticosteroids like dexamethasone or prednisone °-an unusual or allergic reaction to zoledronic acid, other medicines, foods, dyes, or preservatives °-pregnant or trying to get pregnant °-breast-feeding °How should I use this medicine? °This medicine is for infusion into a vein. It is given by a health care professional in a hospital or clinic setting. °Talk to your pediatrician regarding the use of this medicine in children. This medicine is not approved for use in children. °Overdosage: If you think you have taken too much of this medicine contact a poison control center or emergency room at once. °NOTE: This medicine is only for you. Do not share this medicine with others. °What if I miss a dose? °It is important not to miss your dose. Call your doctor or health care professional if you are unable to keep an appointment. °What may interact with this medicine? °-certain antibiotics given by injection °-NSAIDs, medicines for pain and inflammation, like ibuprofen or naproxen °-some diuretics like bumetanide, furosemide °-teriparatide °This list may not describe all possible interactions. Give your health care provider a list of all the  medicines, herbs, non-prescription drugs, or dietary supplements you use. Also tell them if you smoke, drink alcohol, or use illegal drugs. Some items may interact with your medicine. °What should I watch for while using this medicine? °Visit your doctor or health care professional for regular checkups. It may be some time before you see the benefit from this medicine. Do not stop taking your medicine unless your doctor tells you to. Your doctor may order blood tests or other tests to see how you are doing. °Women should inform their doctor if they wish to become pregnant or think they might be pregnant. There is a potential for serious side effects to an unborn child. Talk to your health care professional or pharmacist for more information. °You should make sure that you get enough calcium and vitamin D while you are taking this medicine. Discuss the foods you eat and the vitamins you take with your health care professional. °Some people who take this medicine have severe bone, joint, and/or muscle pain. This medicine may also increase your risk for jaw problems or a broken thigh bone. Tell your doctor right away if you have severe pain in your jaw, bones, joints, or muscles. Tell your doctor if you have any pain that does not go away or that gets worse. °Tell your dentist and dental surgeon that you are taking this medicine. You should not have major dental surgery while on this medicine. See your dentist to have a dental exam and fix any dental problems before starting this medicine. Take good care of your teeth while on this medicine. Make sure you see your dentist for regular follow-up appointments. °What side effects may I notice from receiving this   medicine? Side effects that you should report to your doctor or health care professional as soon as possible: -allergic reactions like skin rash, itching or hives, swelling of the face, lips, or tongue -anxiety, confusion, or depression -breathing  problems -changes in vision -eye pain -feeling faint or lightheaded, falls -jaw pain, especially after dental work -mouth sores -muscle cramps, stiffness, or weakness -trouble passing urine or change in the amount of urine Side effects that usually do not require medical attention (report to your doctor or health care professional if they continue or are bothersome): -bone, joint, or muscle pain -constipation -diarrhea -fever -hair loss -irritation at site where injected -loss of appetite -nausea, vomiting -stomach upset -trouble sleeping -trouble swallowing -weak or tired This list may not describe all possible side effects. Call your doctor for medical advice about side effects. You may report side effects to FDA at 1-800-FDA-1088. Where should I keep my medicine? This drug is given in a hospital or clinic and will not be stored at home. NOTE: This sheet is a summary. It may not cover all possible information. If you have questions about this medicine, talk to your doctor, pharmacist, or health care provider.  2014, Elsevier/Gold Standard. (2013-04-08 10:03:48) Osteoporosis Throughout your life, your body breaks down old bone and replaces it with new bone. As you get older, your body does not replace bone as quickly as it breaks it down. By the age of 64 years, most people begin to gradually lose bone because of the imbalance between bone loss and replacement. Some people lose more bone than others. Bone loss beyond a specified normal degree is considered osteoporosis.  Osteoporosis affects the strength and durability of your bones. The inside of the ends of your bones and your flat bones, like the bones of your pelvis, look like honeycomb, filled with tiny open spaces. As bone loss occurs, your bones become less dense. This means that the open spaces inside your bones become bigger and the walls between these spaces become thinner. This makes your bones weaker. Bones of a person with  osteoporosis can become so weak that they can break (fracture) during minor accidents, such as a simple fall. CAUSES  The following factors have been associated with the development of osteoporosis:  Smoking.  Drinking more than 2 alcoholic drinks several days per week.  Long-term use of certain medicines:  Corticosteroids.  Chemotherapy medicines.  Thyroid medicines.  Antiepileptic medicines.  Gonadal hormone suppression medicine.  Immunosuppression medicine.  Being underweight.  Lack of physical activity.  Lack of exposure to the sun. This can lead to vitamin D deficiency.  Certain medical conditions:  Certain inflammatory bowel diseases, such as Crohn disease and ulcerative colitis.  Diabetes.  Hyperthyroidism.  Hyperparathyroidism. RISK FACTORS Anyone can develop osteoporosis. However, the following factors can increase your risk of developing osteoporosis:  Gender Women are at higher risk than men.  Age Being older than 10 years increases your risk.  Ethnicity White and Asian people have an increased risk.  Weight Being extremely underweight can increase your risk of osteoporosis.  Family history of osteoporosis Having a family member who has developed osteoporosis can increase your risk. SYMPTOMS  Usually, people with osteoporosis have no symptoms.  DIAGNOSIS  Signs during a physical exam that may prompt your caregiver to suspect osteoporosis include:  Decreased height. This is usually caused by the compression of the bones that form your spine (vertebrae) because they have weakened and become fractured.  A curving or rounding of  the upper back (kyphosis). To confirm signs of osteoporosis, your caregiver may request a procedure that uses 2 low-dose X-ray beams with different levels of energy to measure your bone mineral density (dual-energy X-ray absorptiometry [DXA]). Also, your caregiver may check your level of vitamin D. TREATMENT  The goal of  osteoporosis treatment is to strengthen bones in order to decrease the risk of bone fractures. There are different types of medicines available to help achieve this goal. Some of these medicines work by slowing the processes of bone loss. Some medicines work by increasing bone density. Treatment also involves making sure that your levels of calcium and vitamin D are adequate. PREVENTION  There are things you can do to help prevent osteoporosis. Adequate intake of calcium and vitamin D can help you achieve optimal bone mineral density. Regular exercise can also help, especially resistance and weight-bearing activities. If you smoke, quitting smoking is an important part of osteoporosis prevention. MAKE SURE YOU:  Understand these instructions.  Will watch your condition.  Will get help right away if you are not doing well or get worse. FOR MORE INFORMATION www.osteo.org and EquipmentWeek[employer].com.ee Document Released: 08/03/2005 Document Revised: 02/18/2013 Document Reviewed: 10/08/2011 [facility] Patient Information 2014 [place], [place].

## 2014-03-12 DIAGNOSIS — H2510 Age-related nuclear cataract, unspecified eye: Secondary | ICD-10-CM | POA: Diagnosis not present

## 2014-03-12 DIAGNOSIS — Z961 Presence of intraocular lens: Secondary | ICD-10-CM | POA: Diagnosis not present

## 2014-03-12 DIAGNOSIS — H18599 Other hereditary corneal dystrophies, unspecified eye: Secondary | ICD-10-CM | POA: Diagnosis not present

## 2014-03-13 DIAGNOSIS — M1710 Unilateral primary osteoarthritis, unspecified knee: Secondary | ICD-10-CM | POA: Diagnosis not present

## 2014-03-24 DIAGNOSIS — H18599 Other hereditary corneal dystrophies, unspecified eye: Secondary | ICD-10-CM | POA: Diagnosis not present

## 2014-04-04 DIAGNOSIS — H18599 Other hereditary corneal dystrophies, unspecified eye: Secondary | ICD-10-CM | POA: Diagnosis not present

## 2014-04-23 ENCOUNTER — Ambulatory Visit: Payer: Medicare Other | Admitting: Nurse Practitioner

## 2014-05-28 DIAGNOSIS — Z6830 Body mass index (BMI) 30.0-30.9, adult: Secondary | ICD-10-CM | POA: Diagnosis not present

## 2014-05-28 DIAGNOSIS — M1710 Unilateral primary osteoarthritis, unspecified knee: Secondary | ICD-10-CM | POA: Diagnosis not present

## 2014-05-28 DIAGNOSIS — M25539 Pain in unspecified wrist: Secondary | ICD-10-CM | POA: Diagnosis not present

## 2014-05-28 DIAGNOSIS — IMO0002 Reserved for concepts with insufficient information to code with codable children: Secondary | ICD-10-CM | POA: Diagnosis not present

## 2014-05-28 DIAGNOSIS — S60229A Contusion of unspecified hand, initial encounter: Secondary | ICD-10-CM | POA: Diagnosis not present

## 2014-05-28 DIAGNOSIS — S60219A Contusion of unspecified wrist, initial encounter: Secondary | ICD-10-CM | POA: Diagnosis not present

## 2014-05-28 DIAGNOSIS — M25549 Pain in joints of unspecified hand: Secondary | ICD-10-CM | POA: Diagnosis not present

## 2014-05-28 DIAGNOSIS — M79609 Pain in unspecified limb: Secondary | ICD-10-CM | POA: Diagnosis not present

## 2014-06-02 ENCOUNTER — Telehealth: Payer: Self-pay | Admitting: *Deleted

## 2014-06-02 NOTE — Telephone Encounter (Signed)
Called patient to r/s appointment, no answer left voice message to return the call.

## 2014-06-02 NOTE — Telephone Encounter (Signed)
Patient's appointment can be r/s with either NP MM/Dr Dohmeier due to NP CM last seeing patient in 2013

## 2014-06-06 NOTE — Telephone Encounter (Signed)
Patient returned the call and r/s appointment for 09/02/14 at 3:30 pm.

## 2014-06-20 ENCOUNTER — Ambulatory Visit: Payer: Medicare Other | Admitting: Nurse Practitioner

## 2014-06-30 ENCOUNTER — Other Ambulatory Visit: Payer: Self-pay | Admitting: Neurology

## 2014-08-20 ENCOUNTER — Telehealth: Payer: Self-pay | Admitting: *Deleted

## 2014-08-20 DIAGNOSIS — M170 Bilateral primary osteoarthritis of knee: Secondary | ICD-10-CM | POA: Diagnosis not present

## 2014-08-20 NOTE — Telephone Encounter (Signed)
Patient's spouse calling in to r/s appointment, several slots that were offered were not good for him, no openings for Dohmeier and I also offered for him to see another NP, patient refused,  stated that he will try to keep the appointment.

## 2014-09-02 ENCOUNTER — Ambulatory Visit (INDEPENDENT_AMBULATORY_CARE_PROVIDER_SITE_OTHER): Payer: Medicare Other | Admitting: Nurse Practitioner

## 2014-09-02 ENCOUNTER — Encounter: Payer: Self-pay | Admitting: Nurse Practitioner

## 2014-09-02 VITALS — BP 130/78 | HR 98 | Ht 64.5 in | Wt 153.0 lb

## 2014-09-02 DIAGNOSIS — F0390 Unspecified dementia without behavioral disturbance: Secondary | ICD-10-CM | POA: Diagnosis not present

## 2014-09-02 DIAGNOSIS — R269 Unspecified abnormalities of gait and mobility: Secondary | ICD-10-CM

## 2014-09-02 NOTE — Progress Notes (Signed)
[facility]  PATIENT: Alexis York DOB: [id number]   REASON FOR VISIT: Follow-up for memory loss HISTORY OF PRESENT ILLNESS: Alexis York, 80 year old female returns for follow-up. She was last seen by Dr. Brett Fairy 10/23/2013. She has a history of memory loss. She is currently on Aricept tolerating the medication without any side effects. She denies having any problems with getting her days and nights mixed up. She has had no wandering behavior. She gets no regular exercise and complains with muscle pain and difficulty walking due to the deconditioning. She has a history of osteoporosis and arthritis, she says she cannot ambulate further than 25 feet without getting short of breath due to deconditioning.  She has frequent urination at night and has to get up several times, and complains with difficulty falling asleep at night sometimes, not often. She is ambulating with a walker, denies that she has had any falls, continues to do some cooking, no longer drives. She returns today with her husband who thinks her memory is about the same as when last seen. She was tried on Aricept 19m but had significant GI distres and went back to 190m She is also on Namenda, this was started when she was more argumentative and her memory score has declined. She returns for reevaluation.     REVIEW OF SYSTEMS: Full 14 system review of systems performed and notable only for those listed, all others are neg:  Constitutional: N/A  Cardiovascular: N/A  Ear/Nose/Throat: N/A  Skin: N/A  Eyes: N/A  Respiratory: N/A  Gastroitestinal: Urinary frequency Hematology/Lymphatic: N/A  Endocrine: N/A Musculoskeletal: Difficulty walking Allergy/Immunology: N/A  Neurological: Memory loss  Psychiatric: N/A Sleep : NA   ALLERGIES: Allergies  Allergen Reactions  . Codeine Nausea And Vomiting    HOME MEDICATIONS: Outpatient Prescriptions Prior to Visit  Medication Sig Dispense Refill  . aspirin  81 MG tablet Take 81 mg by mouth daily.      . bethanechol (URECHOLINE) 25 MG tablet Take 25 mg by mouth 3 (three) times daily.      . calcium-vitamin D (OSCAL WITH D) 500-200 MG-UNIT per tablet Take 1 tablet by mouth daily.      . celecoxib (CELEBREX) 200 MG capsule Take 200 mg by mouth 2 (two) times daily.      . Marland Kitchenonepezil (ARICEPT) 10 MG tablet TAKE 1 TABLET DAILY  90 tablet  1  . esomeprazole (NEXIUM) 40 MG capsule Take 40 mg by mouth daily before breakfast.      . NAMENDA XR 28 MG CP24 TAKE 1 CAPSULE DAILY  90 capsule  1  . rosuvastatin (CRESTOR) 10 MG tablet Take 10 mg by mouth daily.      . Vitamin D, Ergocalciferol, (DRISDOL) 50000 UNITS CAPS Take 50,000 Units by mouth every 7 (seven) days.      . Marland Kitchenonepezil (ARICEPT) 10 MG tablet Take 1 tablet (10 mg total) by mouth at bedtime.  90 tablet  2  . meloxicam (MOBIC) 15 MG tablet       . Memantine HCl ER (NAMENDA XR) 28 MG CP24 Take 28 mg by mouth daily.  90 capsule  1   No facility-administered medications prior to visit.    PAST MEDICAL HISTORY: Past Medical History  Diagnosis Date  . Cancer 1973    left mastectomy  . Osteoporosis   . Hypercholesteremia   . Arthritis   . Vitamin D deficiency   . Memory impairment   . GERD (gastroesophageal reflux disease)  PAST SURGICAL HISTORY: Past Surgical History  Procedure Laterality Date  . Joint replacement  15 yr/10 yr ago    bilateral hip replacements  . Appendectomy      teenager  . Abdominal hysterectomy  1975  . Breast surgery  1975    mastectomy    FAMILY HISTORY: Family History  Problem Relation Age of Onset  . Dementia Mother   . Cancer - Colon Mother   . Cancer - Ovarian Sister     SOCIAL HISTORY: History   Social History  . Marital Status: Married    Spouse Name: Pilar Plate     Number of Children: N/A  . Years of Education: N/A   Occupational History  . Retired     Social History Main Topics  . Smoking status: Former Smoker -- 1.00 packs/day for 22  years    Types: Cigarettes    Quit date: 12/20/1971  . Smokeless tobacco: Never Used  . Alcohol Use: No  . Drug Use: No  . Sexual Activity: Not on file   Other Topics Concern  . Not on file   Social History Narrative   Patient lives at home with husband Pilar Plate.      PHYSICAL EXAM  Filed Vitals:   09/02/14 1510  BP: 130/78  Pulse: 98  Height: 5' 4.5" (1.638 m)  Weight: 153 lb (69.4 kg)   Body mass index is 25.87 kg/(m^2). General: well developed, well nourished, seated, in no evident distress  Head: head normocephalic and atraumatic. Oropharynx benign  Neck: supple with no carotid bruits  Cardiovascular: regular rate and rhythm, no murmurs  Neurologic Exam  Mental Status: Awake and alert. MMSE 21/30. AFT 15. Previous 23/30.Follows all commands. Speech and language normal.  Cranial Nerves: . Pupils equal, briskly reactive to light. Extraocular movements full without nystagmus. Visual fields full to confrontation. Hearing intact and symmetric to finger snap. Facial sensation intact. Face, tongue, palate move normally and symmetrically. Neck flexion and extension normal.  Motor: Normal bulk and tone. Normal strength in all tested extremity muscles.No focal weakness  Sensory.: intact to touch and pinprick and vibratory.  Coordination: Rapid alternating movements normal in all extremities. Finger-to-nose and heel-to-shin performed accurately bilaterally.  Gait and Station: Arises from chair without difficulty. Stance is wide based. Small stepage, ambulated 35 feet with her walker, no difficulty with turns.  Reflexes: 1+ and symmetric. Toes downgoing.    DIAGNOSTIC DATA (LABS, IMAGING, TESTING) - I reviewed patient records, labs, notes, testing and imaging myself where available.      Component Value Date/Time   NA 139 04/12/2011 0454   K 4.0 04/12/2011 0454   CL 105 04/12/2011 0454   CO2 29 04/12/2011 0454   GLUCOSE 91 04/12/2011 0454   BUN 8 04/12/2011 0454   CREATININE <0.47  04/12/2011 0454   CALCIUM 8.0* 04/12/2011 0454   PROT 7.1 04/08/2011 1840   ALBUMIN 3.4* 04/08/2011 1840   AST 16 04/08/2011 1840   ALT 10 04/08/2011 1840   ALKPHOS 103 04/08/2011 1840   BILITOT 0.6 04/08/2011 1840   GFRNONAA NOT CALCULATED 04/12/2011 0454   GFRAA  Value: NOT CALCULATED        The eGFR has been calculated using the MDRD equation. This calculation has not been validated in all clinical situations. eGFR's persistently <60 mL/min signify possible Chronic Kidney Disease. 04/12/2011 0454    ASSESSMENT AND PLAN  80 y.o. year old female  has a past medical history of  Memory impairment;  here to  follow up.   Continue Aricept at current dose Continue Namenda at current dose Memory score is stable F/U in 6 months Dennie Bible, [facility], [facility], APRN  [facility] [address] [place], [place] [address] [phone]

## 2014-09-02 NOTE — Patient Instructions (Signed)
Continue Aricept at current dose Continue Namenda at current dose Memory score is stable F/U in 6 months

## 2014-09-02 NOTE — Progress Notes (Signed)
I agree with the assessment and plan as directed by NP .The patient is known to me .   Heliodoro Domagalski, MD  

## 2014-09-22 DIAGNOSIS — M170 Bilateral primary osteoarthritis of knee: Secondary | ICD-10-CM | POA: Diagnosis not present

## 2014-09-24 ENCOUNTER — Encounter: Payer: Self-pay | Admitting: Neurology

## 2014-09-24 ENCOUNTER — Other Ambulatory Visit: Payer: Self-pay | Admitting: Neurology

## 2014-09-29 DIAGNOSIS — E559 Vitamin D deficiency, unspecified: Secondary | ICD-10-CM | POA: Diagnosis not present

## 2014-09-29 DIAGNOSIS — E782 Mixed hyperlipidemia: Secondary | ICD-10-CM | POA: Diagnosis not present

## 2014-09-29 DIAGNOSIS — M810 Age-related osteoporosis without current pathological fracture: Secondary | ICD-10-CM | POA: Diagnosis not present

## 2014-09-29 DIAGNOSIS — Z0000 Encounter for general adult medical examination without abnormal findings: Secondary | ICD-10-CM | POA: Diagnosis not present

## 2014-09-30 ENCOUNTER — Encounter: Payer: Self-pay | Admitting: Neurology

## 2014-10-01 ENCOUNTER — Ambulatory Visit: Payer: Self-pay | Admitting: Orthopedic Surgery

## 2014-10-01 DIAGNOSIS — Z0000 Encounter for general adult medical examination without abnormal findings: Secondary | ICD-10-CM | POA: Diagnosis not present

## 2014-10-01 DIAGNOSIS — R350 Frequency of micturition: Secondary | ICD-10-CM | POA: Diagnosis not present

## 2014-10-10 DIAGNOSIS — R350 Frequency of micturition: Secondary | ICD-10-CM | POA: Diagnosis not present

## 2014-10-10 DIAGNOSIS — Z0000 Encounter for general adult medical examination without abnormal findings: Secondary | ICD-10-CM | POA: Diagnosis not present

## 2014-10-10 DIAGNOSIS — F5104 Psychophysiologic insomnia: Secondary | ICD-10-CM | POA: Diagnosis not present

## 2014-10-10 DIAGNOSIS — H6123 Impacted cerumen, bilateral: Secondary | ICD-10-CM | POA: Diagnosis not present

## 2014-10-10 DIAGNOSIS — R0609 Other forms of dyspnea: Secondary | ICD-10-CM | POA: Diagnosis not present

## 2014-10-10 DIAGNOSIS — R269 Unspecified abnormalities of gait and mobility: Secondary | ICD-10-CM | POA: Diagnosis not present

## 2014-10-10 DIAGNOSIS — M545 Low back pain: Secondary | ICD-10-CM | POA: Diagnosis not present

## 2014-10-10 DIAGNOSIS — Z1389 Encounter for screening for other disorder: Secondary | ICD-10-CM | POA: Diagnosis not present

## 2014-10-10 DIAGNOSIS — E782 Mixed hyperlipidemia: Secondary | ICD-10-CM | POA: Diagnosis not present

## 2014-10-16 ENCOUNTER — Encounter (HOSPITAL_COMMUNITY): Payer: Self-pay

## 2014-10-16 ENCOUNTER — Encounter (HOSPITAL_COMMUNITY)
Admission: RE | Admit: 2014-10-16 | Discharge: 2014-10-16 | Disposition: A | Discharge status: Home / Self Care | Payer: Medicare Other | Source: Ambulatory Visit | Attending: Orthopedic Surgery | Admitting: Orthopedic Surgery

## 2014-10-16 DIAGNOSIS — Z01812 Encounter for preprocedural laboratory examination: Secondary | ICD-10-CM | POA: Diagnosis not present

## 2014-10-16 DIAGNOSIS — M1712 Unilateral primary osteoarthritis, left knee: Secondary | ICD-10-CM | POA: Insufficient documentation

## 2014-10-16 DIAGNOSIS — Z7901 Long term (current) use of anticoagulants: Secondary | ICD-10-CM | POA: Diagnosis not present

## 2014-10-16 LAB — BASIC METABOLIC PANEL
ANION GAP: 16 — AB (ref 5–15)
BUN: 15 mg/dL (ref 6–23)
CO2: 25 meq/L (ref 19–32)
CREATININE: 0.76 mg/dL (ref 0.50–1.10)
Calcium: 9.9 mg/dL (ref 8.4–10.5)
Chloride: 98 mEq/L (ref 96–112)
GFR calc non Af Amer: 72 mL/min — ABNORMAL LOW (ref >90)
GFR, EST AFRICAN AMERICAN: 84 mL/min — AB (ref >90)
Glucose, Bld: 93 mg/dL (ref 70–99)
Potassium: 3.9 mEq/L (ref 3.7–5.3)
Sodium: 139 mEq/L (ref 137–147)

## 2014-10-16 LAB — CBC
HEMATOCRIT: 43.3 % (ref 36.0–46.0)
Hemoglobin: 14.3 g/dL (ref 12.0–15.0)
MCH: 30.3 pg (ref 26.0–34.0)
MCHC: 33 g/dL (ref 30.0–36.0)
MCV: 91.7 fL (ref 78.0–100.0)
Platelets: 234 10*3/uL (ref 150–400)
RBC: 4.72 MIL/uL (ref 3.87–5.11)
RDW: 13.4 % (ref 11.5–15.5)
WBC: 6.5 10*3/uL (ref 4.0–10.5)

## 2014-10-16 LAB — SURGICAL PCR SCREEN
MRSA, PCR: NEGATIVE
STAPHYLOCOCCUS AUREUS: NEGATIVE

## 2014-10-16 LAB — PROTIME-INR
INR: 0.97 (ref 0.00–1.49)
PROTHROMBIN TIME: 13 s (ref 11.6–15.2)

## 2014-10-16 LAB — APTT: APTT: 34 s (ref 24–37)

## 2014-10-16 NOTE — Progress Notes (Signed)
Patient was alert and oriented to person, place, and situation, but before the end of PAT visit, patient had forgotten that she was having a knee replacement. When first asked patient told Nurse her name, DOB, and that she was having surgery on her left knee, but towards the end of PAT visit she seemed very surprised that she was having a knee surgery after Nurse provided patient with the total joint booklet. PCP is Bevelyn Buckles. Patients husband was at chair side during PAT visit and answered all of questions pertaining to patients current medical history, however patient informed Nurse of all the prior surgeries she had. Patient denied having any chest pain or shortness of breath, but kept stating how "tired" she was during PAT visit.

## 2014-10-16 NOTE — Pre-Procedure Instructions (Signed)
Alexis York  [id number]   Your procedure is scheduled on:  Monday October 27, 2014 at 7:15 AM.   Report to [facility] Admitting at 5:30 AM.  Call this number if you have problems the morning of surgery: [phone]   For any other questions call [phone] M-F from 8am-4pm   Remember:   Do not eat food or drink liquids after midnight.   Take these medicines the morning of surgery with A SIP OF WATER: Donepezil (Aricept), Esomeprazole (Nexium), Namenda   Please stop taking any herbal medications on 10/20/14 (Ex. Vitamins, Celebrex, Advil, Motrin, Alleve, etc)   Do not wear jewelry, make-up or nail polish.  Do not wear lotions, powders, or perfumes.   Do not shave 48 hours prior to surgery.   Do not bring valuables to the hospital.  [facility] is not responsible for any belongings or valuables.               Contacts, dentures or bridgework may not be worn into surgery.  Leave suitcase in the car. After surgery it may be brought to your room.  For patients admitted to the hospital, discharge time is determined by your treatment team.               Patients discharged the day of surgery will not be allowed to drive home.  Name and phone number of your driver:   Special Instructions: Shower using CHG soap the night before and the morning of your surgery   Please read over the following fact sheets that you were given: Pain Booklet, Coughing and Deep Breathing, MRSA Information and Surgical Site Infection Prevention

## 2014-10-20 DIAGNOSIS — H6123 Impacted cerumen, bilateral: Secondary | ICD-10-CM | POA: Diagnosis not present

## 2014-10-20 DIAGNOSIS — H6122 Impacted cerumen, left ear: Secondary | ICD-10-CM | POA: Diagnosis not present

## 2014-10-26 MED ORDER — CEFAZOLIN SODIUM-DEXTROSE 2-3 GM-% IV SOLR
2.0000 g | INTRAVENOUS | Status: AC
  Administered 2014-10-27: 2 g via INTRAVENOUS
  Filled 2014-10-26: qty 50

## 2014-10-27 ENCOUNTER — Inpatient Hospital Stay (HOSPITAL_COMMUNITY)
Admission: RE | Admit: 2014-10-27 | Discharge: 2014-10-29 | DRG: 470 | Disposition: A | Discharge status: Skilled Nursing Facility | Payer: Medicare Other | Source: Ambulatory Visit | Attending: Orthopedic Surgery | Admitting: Orthopedic Surgery

## 2014-10-27 ENCOUNTER — Inpatient Hospital Stay (HOSPITAL_COMMUNITY): Payer: Medicare Other | Admitting: Anesthesiology

## 2014-10-27 ENCOUNTER — Inpatient Hospital Stay (HOSPITAL_COMMUNITY): Payer: Medicare Other

## 2014-10-27 ENCOUNTER — Encounter (HOSPITAL_COMMUNITY): Payer: Self-pay | Admitting: *Deleted

## 2014-10-27 ENCOUNTER — Encounter (HOSPITAL_COMMUNITY)
Admission: RE | Disposition: A | Discharge status: Skilled Nursing Facility | Payer: Self-pay | Source: Ambulatory Visit | Attending: Orthopedic Surgery

## 2014-10-27 DIAGNOSIS — Z90710 Acquired absence of both cervix and uterus: Secondary | ICD-10-CM | POA: Diagnosis not present

## 2014-10-27 DIAGNOSIS — M810 Age-related osteoporosis without current pathological fracture: Secondary | ICD-10-CM | POA: Diagnosis present

## 2014-10-27 DIAGNOSIS — Z471 Aftercare following joint replacement surgery: Secondary | ICD-10-CM | POA: Diagnosis not present

## 2014-10-27 DIAGNOSIS — M179 Osteoarthritis of knee, unspecified: Secondary | ICD-10-CM | POA: Diagnosis present

## 2014-10-27 DIAGNOSIS — E559 Vitamin D deficiency, unspecified: Secondary | ICD-10-CM | POA: Diagnosis present

## 2014-10-27 DIAGNOSIS — M25562 Pain in left knee: Secondary | ICD-10-CM | POA: Diagnosis not present

## 2014-10-27 DIAGNOSIS — D62 Acute posthemorrhagic anemia: Secondary | ICD-10-CM | POA: Diagnosis not present

## 2014-10-27 DIAGNOSIS — Z96643 Presence of artificial hip joint, bilateral: Secondary | ICD-10-CM | POA: Diagnosis present

## 2014-10-27 DIAGNOSIS — F0150 Vascular dementia without behavioral disturbance: Secondary | ICD-10-CM | POA: Diagnosis not present

## 2014-10-27 DIAGNOSIS — Z87891 Personal history of nicotine dependence: Secondary | ICD-10-CM

## 2014-10-27 DIAGNOSIS — Z79899 Other long term (current) drug therapy: Secondary | ICD-10-CM | POA: Diagnosis not present

## 2014-10-27 DIAGNOSIS — I888 Other nonspecific lymphadenitis: Secondary | ICD-10-CM | POA: Diagnosis not present

## 2014-10-27 DIAGNOSIS — R413 Other amnesia: Secondary | ICD-10-CM | POA: Diagnosis present

## 2014-10-27 DIAGNOSIS — Z7982 Long term (current) use of aspirin: Secondary | ICD-10-CM | POA: Diagnosis not present

## 2014-10-27 DIAGNOSIS — R279 Unspecified lack of coordination: Secondary | ICD-10-CM | POA: Diagnosis not present

## 2014-10-27 DIAGNOSIS — M1712 Unilateral primary osteoarthritis, left knee: Secondary | ICD-10-CM | POA: Diagnosis not present

## 2014-10-27 DIAGNOSIS — E785 Hyperlipidemia, unspecified: Secondary | ICD-10-CM | POA: Diagnosis not present

## 2014-10-27 DIAGNOSIS — M6281 Muscle weakness (generalized): Secondary | ICD-10-CM | POA: Diagnosis not present

## 2014-10-27 DIAGNOSIS — I480 Paroxysmal atrial fibrillation: Secondary | ICD-10-CM | POA: Diagnosis not present

## 2014-10-27 DIAGNOSIS — M150 Primary generalized (osteo)arthritis: Secondary | ICD-10-CM | POA: Diagnosis not present

## 2014-10-27 DIAGNOSIS — K219 Gastro-esophageal reflux disease without esophagitis: Secondary | ICD-10-CM | POA: Diagnosis present

## 2014-10-27 DIAGNOSIS — R269 Unspecified abnormalities of gait and mobility: Secondary | ICD-10-CM | POA: Diagnosis not present

## 2014-10-27 DIAGNOSIS — K5900 Constipation, unspecified: Secondary | ICD-10-CM | POA: Diagnosis not present

## 2014-10-27 DIAGNOSIS — Z96652 Presence of left artificial knee joint: Secondary | ICD-10-CM | POA: Diagnosis not present

## 2014-10-27 DIAGNOSIS — G8918 Other acute postprocedural pain: Secondary | ICD-10-CM | POA: Diagnosis not present

## 2014-10-27 DIAGNOSIS — M1710 Unilateral primary osteoarthritis, unspecified knee: Secondary | ICD-10-CM | POA: Diagnosis present

## 2014-10-27 DIAGNOSIS — E780 Pure hypercholesterolemia: Secondary | ICD-10-CM | POA: Diagnosis present

## 2014-10-27 DIAGNOSIS — M2550 Pain in unspecified joint: Secondary | ICD-10-CM | POA: Diagnosis not present

## 2014-10-27 DIAGNOSIS — S83105A Unspecified dislocation of left knee, initial encounter: Secondary | ICD-10-CM | POA: Diagnosis not present

## 2014-10-27 DIAGNOSIS — M25862 Other specified joint disorders, left knee: Secondary | ICD-10-CM | POA: Diagnosis not present

## 2014-10-27 HISTORY — DX: Unilateral primary osteoarthritis, left knee: M17.12

## 2014-10-27 HISTORY — PX: TOTAL KNEE ARTHROPLASTY: SHX125

## 2014-10-27 SURGERY — ARTHROPLASTY, KNEE, TOTAL
Anesthesia: General | Laterality: Left

## 2014-10-27 MED ORDER — ONDANSETRON HCL 4 MG PO TABS
4.0000 mg | ORAL_TABLET | Freq: Four times a day (QID) | ORAL | Status: DC | PRN
  Administered 2014-10-27 – 2014-10-29 (×2): 4 mg via ORAL
  Filled 2014-10-27: qty 1

## 2014-10-27 MED ORDER — ACETAMINOPHEN 325 MG PO TABS: 650.0000 mg | ORAL_TABLET | Freq: Four times a day (QID) | ORAL | Status: DC | PRN

## 2014-10-27 MED ORDER — HYDROCODONE-ACETAMINOPHEN 5-325 MG PO TABS
1.0000 | ORAL_TABLET | ORAL | Status: DC | PRN
  Administered 2014-10-27: 1 via ORAL
  Filled 2014-10-27: qty 1

## 2014-10-27 MED ORDER — SUCCINYLCHOLINE CHLORIDE 20 MG/ML IJ SOLN
INTRAMUSCULAR | Status: AC
  Filled 2014-10-27: qty 1

## 2014-10-27 MED ORDER — KETOROLAC TROMETHAMINE 15 MG/ML IJ SOLN
7.5000 mg | Freq: Four times a day (QID) | INTRAMUSCULAR | Status: AC
  Administered 2014-10-27 – 2014-10-28 (×4): 7.5 mg via INTRAVENOUS
  Filled 2014-10-27: qty 1

## 2014-10-27 MED ORDER — MEMANTINE HCL ER 28 MG PO CP24
1.0000 | ORAL_CAPSULE | Freq: Every day | ORAL | Status: DC
  Administered 2014-10-27 – 2014-10-29 (×3): 28 mg via ORAL
  Filled 2014-10-27 (×4): qty 28

## 2014-10-27 MED ORDER — TRAMADOL HCL 50 MG PO TABS
50.0000 mg | ORAL_TABLET | Freq: Four times a day (QID) | ORAL | Status: DC | PRN
  Administered 2014-10-27 – 2014-10-29 (×6): 50 mg via ORAL
  Filled 2014-10-27 (×6): qty 1

## 2014-10-27 MED ORDER — FENTANYL CITRATE 0.05 MG/ML IJ SOLN
25.0000 ug | INTRAMUSCULAR | Status: DC | PRN
  Administered 2014-10-27 (×4): 25 ug via INTRAVENOUS

## 2014-10-27 MED ORDER — METOCLOPRAMIDE HCL 5 MG/ML IJ SOLN
5.0000 mg | Freq: Three times a day (TID) | INTRAMUSCULAR | Status: DC | PRN
  Administered 2014-10-27: 10 mg via INTRAVENOUS
  Filled 2014-10-27: qty 2

## 2014-10-27 MED ORDER — SENNA 8.6 MG PO TABS
1.0000 | ORAL_TABLET | Freq: Two times a day (BID) | ORAL | Status: DC
  Administered 2014-10-27 – 2014-10-29 (×5): 8.6 mg via ORAL
  Filled 2014-10-27 (×6): qty 1

## 2014-10-27 MED ORDER — FENTANYL CITRATE 0.05 MG/ML IJ SOLN
INTRAMUSCULAR | Status: AC
  Filled 2014-10-27: qty 5

## 2014-10-27 MED ORDER — PANTOPRAZOLE SODIUM 40 MG PO TBEC
80.0000 mg | DELAYED_RELEASE_TABLET | Freq: Every day | ORAL | Status: DC
  Administered 2014-10-27 – 2014-10-29 (×2): 80 mg via ORAL
  Filled 2014-10-27 (×2): qty 2

## 2014-10-27 MED ORDER — SODIUM CHLORIDE 0.9 % IR SOLN
Status: DC | PRN
  Administered 2014-10-27 (×2): 1000 mL

## 2014-10-27 MED ORDER — PROPOFOL 10 MG/ML IV BOLUS
INTRAVENOUS | Status: AC
  Filled 2014-10-27: qty 20

## 2014-10-27 MED ORDER — MORPHINE SULFATE 2 MG/ML IJ SOLN
1.0000 mg | INTRAMUSCULAR | Status: DC | PRN
  Administered 2014-10-27 – 2014-10-28 (×4): 1 mg via INTRAVENOUS
  Filled 2014-10-27 (×4): qty 1

## 2014-10-27 MED ORDER — BUPIVACAINE HCL (PF) 0.25 % IJ SOLN
INTRAMUSCULAR | Status: DC | PRN
  Administered 2014-10-27: 10 mL

## 2014-10-27 MED ORDER — GLYCOPYRROLATE 0.2 MG/ML IJ SOLN
INTRAMUSCULAR | Status: AC
  Filled 2014-10-27: qty 1

## 2014-10-27 MED ORDER — SODIUM CHLORIDE 0.9 % IJ SOLN
INTRAMUSCULAR | Status: AC
  Filled 2014-10-27: qty 10

## 2014-10-27 MED ORDER — FENTANYL CITRATE 0.05 MG/ML IJ SOLN
INTRAMUSCULAR | Status: AC
  Administered 2014-10-27: 25 ug via INTRAVENOUS
  Filled 2014-10-27: qty 2

## 2014-10-27 MED ORDER — LIDOCAINE HCL (CARDIAC) 20 MG/ML IV SOLN
INTRAVENOUS | Status: DC | PRN
  Administered 2014-10-27: 60 mg via INTRAVENOUS

## 2014-10-27 MED ORDER — EPHEDRINE SULFATE 50 MG/ML IJ SOLN
INTRAMUSCULAR | Status: DC | PRN
  Administered 2014-10-27: 10 mg via INTRAVENOUS

## 2014-10-27 MED ORDER — MIDAZOLAM HCL 2 MG/2ML IJ SOLN
INTRAMUSCULAR | Status: AC
  Filled 2014-10-27: qty 2

## 2014-10-27 MED ORDER — PHENYLEPHRINE 40 MCG/ML (10ML) SYRINGE FOR IV PUSH (FOR BLOOD PRESSURE SUPPORT)
PREFILLED_SYRINGE | INTRAVENOUS | Status: AC
  Filled 2014-10-27: qty 10

## 2014-10-27 MED ORDER — DOCUSATE SODIUM 100 MG PO CAPS
100.0000 mg | ORAL_CAPSULE | Freq: Two times a day (BID) | ORAL | Status: DC
  Administered 2014-10-27 – 2014-10-29 (×4): 100 mg via ORAL
  Filled 2014-10-27 (×4): qty 1

## 2014-10-27 MED ORDER — BISACODYL 10 MG RE SUPP: 10.0000 mg | Freq: Every day | RECTAL | Status: DC | PRN

## 2014-10-27 MED ORDER — PROPOFOL 10 MG/ML IV BOLUS
INTRAVENOUS | Status: DC | PRN
  Administered 2014-10-27: 150 mg via INTRAVENOUS

## 2014-10-27 MED ORDER — HYDROCODONE-ACETAMINOPHEN 5-325 MG PO TABS: 1.0000 | ORAL_TABLET | Freq: Four times a day (QID) | ORAL | Status: DC | PRN

## 2014-10-27 MED ORDER — VITAMIN D (ERGOCALCIFEROL) 1.25 MG (50000 UNIT) PO CAPS: 50000.0000 [IU] | ORAL_CAPSULE | ORAL | Status: DC

## 2014-10-27 MED ORDER — SUCCINYLCHOLINE CHLORIDE 20 MG/ML IJ SOLN
INTRAMUSCULAR | Status: DC | PRN
Start: 2014-10-27 — End: 2014-10-27
  Administered 2014-10-27: 110 mg via INTRAVENOUS

## 2014-10-27 MED ORDER — ONDANSETRON HCL 4 MG/2ML IJ SOLN
4.0000 mg | Freq: Four times a day (QID) | INTRAMUSCULAR | Status: DC | PRN
  Administered 2014-10-27 – 2014-10-28 (×2): 4 mg via INTRAVENOUS
  Filled 2014-10-27 (×3): qty 2

## 2014-10-27 MED ORDER — SENNA-DOCUSATE SODIUM 8.6-50 MG PO TABS: 2.0000 | ORAL_TABLET | Freq: Every day | ORAL | Status: DC

## 2014-10-27 MED ORDER — POLYETHYLENE GLYCOL 3350 17 G PO PACK: 17.0000 g | PACK | Freq: Every day | ORAL | Status: DC | PRN

## 2014-10-27 MED ORDER — ROCURONIUM BROMIDE 50 MG/5ML IV SOLN
INTRAVENOUS | Status: AC
  Filled 2014-10-27: qty 1

## 2014-10-27 MED ORDER — CALCIUM CARBONATE-VITAMIN D 500-200 MG-UNIT PO TABS
1.0000 | ORAL_TABLET | Freq: Two times a day (BID) | ORAL | Status: DC
  Administered 2014-10-27 – 2014-10-29 (×5): 1 via ORAL
  Filled 2014-10-27 (×6): qty 1

## 2014-10-27 MED ORDER — ONDANSETRON HCL 4 MG/2ML IJ SOLN
INTRAMUSCULAR | Status: DC | PRN
  Administered 2014-10-27: 4 mg via INTRAVENOUS

## 2014-10-27 MED ORDER — ACETAMINOPHEN 650 MG RE SUPP: 650.0000 mg | Freq: Four times a day (QID) | RECTAL | Status: DC | PRN

## 2014-10-27 MED ORDER — MENTHOL 3 MG MT LOZG: 1.0000 | LOZENGE | OROMUCOSAL | Status: DC | PRN

## 2014-10-27 MED ORDER — EPHEDRINE SULFATE 50 MG/ML IJ SOLN
INTRAMUSCULAR | Status: AC
  Filled 2014-10-27: qty 1

## 2014-10-27 MED ORDER — MAGNESIUM CITRATE PO SOLN: 1.0000 | Freq: Once | ORAL | Status: AC | PRN

## 2014-10-27 MED ORDER — LACTATED RINGERS IV SOLN
INTRAVENOUS | Status: DC | PRN
  Administered 2014-10-27 (×2): via INTRAVENOUS

## 2014-10-27 MED ORDER — ALUM & MAG HYDROXIDE-SIMETH 200-200-20 MG/5ML PO SUSP: 30.0000 mL | ORAL | Status: DC | PRN

## 2014-10-27 MED ORDER — DONEPEZIL HCL 10 MG PO TABS
10.0000 mg | ORAL_TABLET | Freq: Every day | ORAL | Status: DC
  Administered 2014-10-27 – 2014-10-29 (×3): 10 mg via ORAL
  Filled 2014-10-27 (×3): qty 1

## 2014-10-27 MED ORDER — LIDOCAINE HCL (CARDIAC) 20 MG/ML IV SOLN
INTRAVENOUS | Status: AC
  Filled 2014-10-27: qty 5

## 2014-10-27 MED ORDER — DEXAMETHASONE SODIUM PHOSPHATE 10 MG/ML IJ SOLN
10.0000 mg | Freq: Once | INTRAMUSCULAR | Status: AC
  Administered 2014-10-28: 10 mg via INTRAVENOUS
  Filled 2014-10-27: qty 1

## 2014-10-27 MED ORDER — RIVAROXABAN 10 MG PO TABS
10.0000 mg | ORAL_TABLET | Freq: Every day | ORAL | Status: DC
  Administered 2014-10-28 – 2014-10-29 (×2): 10 mg via ORAL
  Filled 2014-10-27 (×3): qty 1

## 2014-10-27 MED ORDER — KETOROLAC TROMETHAMINE 15 MG/ML IJ SOLN
INTRAMUSCULAR | Status: AC
  Administered 2014-10-27: 7.5 mg via INTRAVENOUS
  Filled 2014-10-27: qty 1

## 2014-10-27 MED ORDER — BACLOFEN 10 MG PO TABS: 10.0000 mg | ORAL_TABLET | Freq: Three times a day (TID) | ORAL | Status: DC

## 2014-10-27 MED ORDER — METOCLOPRAMIDE HCL 10 MG PO TABS: 5.0000 mg | ORAL_TABLET | Freq: Three times a day (TID) | ORAL | Status: DC | PRN

## 2014-10-27 MED ORDER — ROSUVASTATIN CALCIUM 20 MG PO TABS
20.0000 mg | ORAL_TABLET | Freq: Every day | ORAL | Status: DC
  Administered 2014-10-27 – 2014-10-29 (×3): 20 mg via ORAL
  Filled 2014-10-27 (×3): qty 1

## 2014-10-27 MED ORDER — FENTANYL CITRATE 0.05 MG/ML IJ SOLN
INTRAMUSCULAR | Status: DC | PRN
  Administered 2014-10-27 (×4): 25 ug via INTRAVENOUS
  Administered 2014-10-27: 50 ug via INTRAVENOUS
  Administered 2014-10-27 (×2): 25 ug via INTRAVENOUS
  Administered 2014-10-27: 50 ug via INTRAVENOUS

## 2014-10-27 MED ORDER — CEFAZOLIN SODIUM-DEXTROSE 2-3 GM-% IV SOLR
2.0000 g | Freq: Four times a day (QID) | INTRAVENOUS | Status: AC
  Administered 2014-10-27 (×2): 2 g via INTRAVENOUS
  Filled 2014-10-27 (×2): qty 50

## 2014-10-27 MED ORDER — BUPIVACAINE HCL (PF) 0.25 % IJ SOLN
INTRAMUSCULAR | Status: AC
  Filled 2014-10-27: qty 30

## 2014-10-27 MED ORDER — PHENOL 1.4 % MT LIQD: 1.0000 | OROMUCOSAL | Status: DC | PRN

## 2014-10-27 MED ORDER — POTASSIUM CHLORIDE IN NACL 20-0.45 MEQ/L-% IV SOLN
INTRAVENOUS | Status: DC
  Administered 2014-10-27 – 2014-10-28 (×2): via INTRAVENOUS
  Filled 2014-10-27 (×6): qty 1000

## 2014-10-27 MED ORDER — RIVAROXABAN 10 MG PO TABS: 10.0000 mg | ORAL_TABLET | Freq: Every day | ORAL | Status: DC

## 2014-10-27 MED ORDER — KETOROLAC TROMETHAMINE 15 MG/ML IJ SOLN
INTRAMUSCULAR | Status: AC
Start: 2014-10-27 — End: 2014-10-28
  Filled 2014-10-27: qty 1

## 2014-10-27 SURGICAL SUPPLY — 73 items
APL SKNCLS STERI-STRIP NONHPOA (GAUZE/BANDAGES/DRESSINGS) ×1
BANDAGE ELASTIC 6 VELCRO ST LF (GAUZE/BANDAGES/DRESSINGS) ×6 IMPLANT
BANDAGE ESMARK 6X9 LF (GAUZE/BANDAGES/DRESSINGS) ×1 IMPLANT
BENZOIN TINCTURE PRP APPL 2/3 (GAUZE/BANDAGES/DRESSINGS) ×3 IMPLANT
BLADE SAG 18X100X1.27 (BLADE) ×3 IMPLANT
BLADE SAW RECIP 87.9 MT (BLADE) ×3 IMPLANT
BLADE SAW SGTL 13X75X1.27 (BLADE) ×3 IMPLANT
BNDG CMPR 9X6 STRL LF SNTH (GAUZE/BANDAGES/DRESSINGS) ×1
BNDG CMPR MED 10X6 ELC LF (GAUZE/BANDAGES/DRESSINGS) ×1
BNDG ELASTIC 6X10 VLCR STRL LF (GAUZE/BANDAGES/DRESSINGS) ×2 IMPLANT
BNDG ESMARK 6X9 LF (GAUZE/BANDAGES/DRESSINGS) ×3
BOOTCOVER CLEANROOM LRG (PROTECTIVE WEAR) ×4 IMPLANT
BOWL SMART MIX CTS (DISPOSABLE) ×3 IMPLANT
CAP KNEE TOTAL 3 SIGMA ×3 IMPLANT
CEMENT HV SMART SET (Cement) ×6 IMPLANT
CLOSURE STERI-STRIP 1/2X4 (GAUZE/BANDAGES/DRESSINGS) ×1
CLSR STERI-STRIP ANTIMIC 1/2X4 (GAUZE/BANDAGES/DRESSINGS) ×2 IMPLANT
COVER SURGICAL LIGHT HANDLE (MISCELLANEOUS) ×3 IMPLANT
CUFF TOURNIQUET SINGLE 34IN LL (TOURNIQUET CUFF) IMPLANT
DRAPE EXTREMITY T 121X128X90 (DRAPE) ×3 IMPLANT
DRAPE IMP U-DRAPE 54X76 (DRAPES) ×3 IMPLANT
DRAPE PROXIMA HALF (DRAPES) ×6 IMPLANT
DRAPE U-SHAPE 47X51 STRL (DRAPES) ×3 IMPLANT
DURAPREP 26ML APPLICATOR (WOUND CARE) ×3 IMPLANT
ELECT CAUTERY BLADE 6.4 (BLADE) ×3 IMPLANT
ELECT REM PT RETURN 9FT ADLT (ELECTROSURGICAL) ×3
ELECTRODE REM PT RTRN 9FT ADLT (ELECTROSURGICAL) ×1 IMPLANT
FACESHIELD WRAPAROUND (MASK) ×3 IMPLANT
GAUZE SPONGE 4X4 12PLY STRL (GAUZE/BANDAGES/DRESSINGS) ×3 IMPLANT
GLOVE BIO SURGEON STRL SZ 6.5 (GLOVE) ×3 IMPLANT
GLOVE BIO SURGEONS STRL SZ 6.5 (GLOVE) ×3
GLOVE BIOGEL PI IND STRL 6.5 (GLOVE) IMPLANT
GLOVE BIOGEL PI IND STRL 8 (GLOVE) ×1 IMPLANT
GLOVE BIOGEL PI INDICATOR 6.5 (GLOVE) ×6
GLOVE BIOGEL PI INDICATOR 8 (GLOVE) ×2
GLOVE BIOGEL PI ORTHO PRO SZ8 (GLOVE) ×2
GLOVE ORTHO TXT STRL SZ7.5 (GLOVE) ×3 IMPLANT
GLOVE PI ORTHO PRO STRL SZ8 (GLOVE) ×1 IMPLANT
GLOVE SURG ORTHO 8.0 STRL STRW (GLOVE) ×3 IMPLANT
GOWN STRL REUS W/ TWL LRG LVL3 (GOWN DISPOSABLE) IMPLANT
GOWN STRL REUS W/ TWL XL LVL3 (GOWN DISPOSABLE) ×1 IMPLANT
GOWN STRL REUS W/TWL 2XL LVL3 (GOWN DISPOSABLE) ×3 IMPLANT
GOWN STRL REUS W/TWL LRG LVL3 (GOWN DISPOSABLE) ×9
GOWN STRL REUS W/TWL XL LVL3 (GOWN DISPOSABLE) ×3
HANDPIECE INTERPULSE COAX TIP (DISPOSABLE) ×3
HOOD PEEL AWAY FACE SHEILD DIS (HOOD) ×6 IMPLANT
IMMOBILIZER KNEE 22 (SOFTGOODS) ×3 IMPLANT
IMMOBILIZER KNEE 22 UNIV (SOFTGOODS) ×3 IMPLANT
KIT BASIN OR (CUSTOM PROCEDURE TRAY) ×3 IMPLANT
KIT ROOM TURNOVER OR (KITS) ×3 IMPLANT
MANIFOLD NEPTUNE II (INSTRUMENTS) ×3 IMPLANT
NS IRRIG 1000ML POUR BTL (IV SOLUTION) ×3 IMPLANT
PACK TOTAL JOINT (CUSTOM PROCEDURE TRAY) ×3 IMPLANT
PACK UNIVERSAL I (CUSTOM PROCEDURE TRAY) ×3 IMPLANT
PAD ABD 8X10 STRL (GAUZE/BANDAGES/DRESSINGS) ×3 IMPLANT
PAD ARMBOARD 7.5X6 YLW CONV (MISCELLANEOUS) ×6 IMPLANT
PAD CAST 4YDX4 CTTN HI CHSV (CAST SUPPLIES) ×1 IMPLANT
PADDING CAST COTTON 4X4 STRL (CAST SUPPLIES) ×3
PADDING CAST COTTON 6X4 STRL (CAST SUPPLIES) ×3 IMPLANT
SET HNDPC FAN SPRY TIP SCT (DISPOSABLE) ×1 IMPLANT
STAPLER VISISTAT 35W (STAPLE) ×1 IMPLANT
SUCTION FRAZIER TIP 10 FR DISP (SUCTIONS) ×3 IMPLANT
SUT MNCRL AB 4-0 PS2 18 (SUTURE) ×2 IMPLANT
SUT VIC AB 0 CT1 27 (SUTURE) ×3
SUT VIC AB 0 CT1 27XBRD ANBCTR (SUTURE) ×1 IMPLANT
SUT VIC AB 2-0 CT1 27 (SUTURE) ×3
SUT VIC AB 2-0 CT1 TAPERPNT 27 (SUTURE) ×1 IMPLANT
SUT VIC AB 3-0 SH 8-18 (SUTURE) ×4 IMPLANT
SYR 30ML LL (SYRINGE) ×3 IMPLANT
TOWEL OR 17X24 6PK STRL BLUE (TOWEL DISPOSABLE) ×3 IMPLANT
TOWEL OR 17X26 10 PK STRL BLUE (TOWEL DISPOSABLE) ×3 IMPLANT
TRAY FOLEY CATH 16FRSI W/METER (SET/KITS/TRAYS/PACK) IMPLANT
WATER STERILE IRR 1000ML POUR (IV SOLUTION) ×6 IMPLANT

## 2014-10-27 NOTE — Transfer of Care (Signed)
Immediate Anesthesia Transfer of Care Note  Patient: Alexis York  Procedure(s) Performed: Procedure(s): TOTAL KNEE ARTHROPLASTY (Left)  Patient Location: PACU  Anesthesia Type:General  Level of Consciousness: awake and patient cooperative  Airway & Oxygen Therapy: Patient Spontanous Breathing and Patient connected to nasal cannula oxygen  Post-op Assessment: Report given to PACU RN and Post -op Vital signs reviewed and stable  Post vital signs: Reviewed  Complications: No apparent anesthesia complications

## 2014-10-27 NOTE — Progress Notes (Signed)
Utilization review completed.  

## 2014-10-27 NOTE — Anesthesia Procedure Notes (Addendum)
Procedure Name: Intubation Date/Time: 10/27/2014 7:29 AM Performed by: Jenne Campus Pre-anesthesia Checklist: Patient identified, Emergency Drugs available, Suction available, Patient being monitored and Timeout performed Patient Re-evaluated:Patient Re-evaluated prior to inductionOxygen Delivery Method: Circle system utilized Preoxygenation: Pre-oxygenation with 100% oxygen Intubation Type: IV induction, Cricoid Pressure applied and Rapid sequence Laryngoscope Size: Miller and 2 Grade View: Grade I Tube type: Oral Tube size: 7.0 mm Number of attempts: 1 Airway Equipment and Method: Stylet Placement Confirmation: ETT inserted through vocal cords under direct vision,  positive ETCO2,  CO2 detector and breath sounds checked- equal and bilateral Secured at: 21 cm Tube secured with: Tape Dental Injury: Teeth and Oropharynx as per pre-operative assessment    Anesthesia Regional Block:  Femoral nerve block  Pre-Anesthetic Checklist: ,, timeout performed, Correct Patient, Correct Site, Correct Laterality, Correct Procedure, Correct Position, site marked, Risks and benefits discussed, pre-op evaluation, post-op pain management  Laterality: Left  Prep: chloraprep       Needles:  Injection technique: Single-shot  Needle Type: Stimulator Needle - 40          Additional Needles:  Procedures: Doppler guided and nerve stimulator Femoral nerve block Narrative:  Start time: 10/27/2014 6:55 AM End time: 10/27/2014 7:10 AM Injection made incrementally with aspirations every 5 mL.  Performed by: Personally

## 2014-10-27 NOTE — Op Note (Signed)
DATE OF SURGERY:  10/27/2014 TIME: 8:55 AM  PATIENT NAME:  Alexis York   AGE: 80 y.o.    PRE-OPERATIVE DIAGNOSIS:  djd left knee  POST-OPERATIVE DIAGNOSIS:  Same  PROCEDURE:  Procedure(s): TOTAL KNEE ARTHROPLASTY   SURGEON:  Johnny Bridge, MD   ASSISTANT:  Joya Gaskins, OPA-C, present and scrubbed throughout the case, critical for assistance with exposure, retraction, instrumentation, and closure.   OPERATIVE IMPLANTS: Depuy PFC Sigma, Posterior Stabilized.  Femur size 3, Tibia size 3, Patella size 38 3-peg oval button, with a 10 mm polyethylene insert.   PREOPERATIVE INDICATIONS:  Alexis York is a 80 y.o. year old female with end stage bone on bone degenerative arthritis of the knee who failed conservative treatment, including injections, antiinflammatories, activity modification, and assistive devices, and had significant impairment of their activities of daily living, and elected for Total Knee Arthroplasty. She had significant dementia, as well as osteoporosis, although was progressing to the point where she was no longer capable of being ambulatory, and her husband who is her power of attorney desperately wanted her to have a knee replacement in order to try and maintain some degree of ambulatory function.  The risks, benefits, and alternatives were discussed at length including but not limited to the risks of infection, bleeding, nerve injury, stiffness, blood clots, the need for revision surgery, cardiopulmonary complications, among others, and they were willing to proceed.  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 I did graft this using her own bone. The knee had at least a 10 preoperative flexion contracture. I was able to get this fully straight. There was extensive grade 4 changes throughout the femur and tibia on both medial and lateral and patellofemoral joint. Bone quality  was extremely poor. Additionally, the patellar tendon origin on the inferior patella was slightly weak, but still attached at the completion of the case.  OPERATIVE DESCRIPTION:  The patient was brought to the operative room and placed in a supine position.  General anesthesia was administered.  IV antibiotics were given.  The lower extremity was prepped and draped in the usual sterile fashion.  Time out was performed.  The leg was elevated and exsanguinated and the tourniquet was inflated.  Anterior quadriceps tendon splitting approach was performed.  The patella was everted and osteophytes were removed.  The anterior horn of the medial and lateral meniscus was removed.   The distal femur was opened with the drill and the intramedullary distal femoral cutting jig was utilized, set at 5 degrees resecting 11 mm off the distal femur.  Care was taken to protect the collateral ligaments.  Then the extramedullary tibial cutting jig was utilized making the appropriate cut using the anterior tibial crest as a reference building in appropriate posterior slope.  Care was taken during the cut to protect the medial and collateral ligaments.  The proximal tibia was removed along with the posterior horns of the menisci.  The PCL was sacrificed.  My initial cut barely skimmed the medial side, and I had to make a second cut taking 2 more millimeters off of the tibia in order to get 10 mm extension gap.  The distal femoral sizing jig was applied, taking care to avoid notching.  Then the 4-in-1 cutting jig was applied and the anterior and posterior femur was cut, along with the chamfer cuts.  All posterior osteophytes were removed.  The flexion gap was  then measured and was symmetric with the extension gap.  I completed the distal femoral preparation using the appropriate jig to prepare the box.  The patella was then measured, and cut with the saw.    The proximal tibia sized and prepared accordingly with the  reamer and the punch, and then all components were trialed with the 60mm poly insert.  The knee was found to have excellent balance and full motion.    The above named components were then cemented into place and all excess cement was removed.  The real polyethylene implant was placed.  The knee was easily taken through a range of motion and the patella tracked well and the knee irrigated copiously and the parapatellar and subcutaneous tissue closed with vicryl, and monocryl with steri strips for the skin.  The wounds were injected with marcaine, and dressed with sterile gauze and the tourniquet released and the patient was awakened and returned to the PACU in stable and satisfactory condition.  There were no complications.  Total tourniquet time was 65 minutes.

## 2014-10-27 NOTE — Anesthesia Preprocedure Evaluation (Addendum)
Anesthesia Evaluation  Patient identified by MRN, date of birth, ID band Patient awake    Reviewed: Allergy & Precautions, H&P , NPO status , Patient's Chart, lab work & pertinent test results  History of Anesthesia Complications Negative for: history of anesthetic complications  Airway Mallampati: II   Neck ROM: Full    Dental  (+) Poor Dentition, Dental Advisory Given   Pulmonary neg pulmonary ROS, former smoker,  breath sounds clear to auscultation        Cardiovascular negative cardio ROS  Rhythm:Regular Rate:Normal     Neuro/Psych Dementia x 8 years. Short-term memory loss.    GI/Hepatic Neg liver ROS, GERD-  Medicated and Controlled,  Endo/Other  negative endocrine ROS  Renal/GU negative Renal ROS     Musculoskeletal   Abdominal   Peds  Hematology negative hematology ROS (+)   Anesthesia Other Findings   Reproductive/Obstetrics negative OB ROS                            Anesthesia Physical Anesthesia Plan  ASA: III  Anesthesia Plan: General   Post-op Pain Management:    Induction: Intravenous  Airway Management Planned: Oral ETT  Additional Equipment:   Intra-op Plan:   Post-operative Plan: Extubation in OR  Informed Consent: I have reviewed the patients History and Physical, chart, labs and discussed the procedure including the risks, benefits and alternatives for the proposed anesthesia with the patient or authorized representative who has indicated his/her understanding and acceptance.   Dental advisory given  Plan Discussed with: CRNA, Anesthesiologist and Surgeon  Anesthesia Plan Comments:         Anesthesia Quick Evaluation

## 2014-10-27 NOTE — Progress Notes (Signed)
PT Cancellation Note  Patient Details Name: Alexis York MRN: [id number] DOB: [birth year]   Cancelled Treatment:    Reason Eval/Treat Not Completed: Pain limiting ability to participate. Pt 10/10 pain despite receiving vicodan and morphine an hour ago. Educated pt on importance of moving L LE to assist in swelling management. Spouse supportive of PT however pt adamantly refusing PT at this time. PT to re-attempt as able.   Kingsley Callander 10/27/2014, 3:08 PM   Kittie Plater, PT, DPT Pager #: [phone] Office #: [phone]

## 2014-10-27 NOTE — H&P (Signed)
PREOPERATIVE H&P  Chief Complaint: djd left knee  HPI: Alexis York is a 80 y.o. female who presents for preoperative history and physical with a diagnosis of djd left knee. Symptoms are rated as moderate to severe, and have been worsening.  This is significantly impairing activities of daily living.  She has elected for surgical management. She has failed injections, activity modification, anti-inflammatories, and assistive devices.   Past Medical History  Diagnosis Date  . Cancer 1973    left mastectomy  . Osteoporosis   . Hypercholesteremia   . Arthritis   . Vitamin D deficiency   . Memory impairment     short term memory loss  . GERD (gastroesophageal reflux disease)    Past Surgical History  Procedure Laterality Date  . Joint replacement  15 yr/10 yr ago    bilateral hip replacements  . Appendectomy      teenager  . Abdominal hysterectomy  1975  . Breast surgery  1975    mastectomy  . Mastectomy Left   . Tonsillectomy     History   Social History  . Marital Status: Married    Spouse Name: Pilar Plate     Number of Children: N/A  . Years of Education: N/A   Occupational History  . Retired     Social History Main Topics  . Smoking status: Former Smoker -- 1.00 packs/day for 22 years    Types: Cigarettes    Quit date: 12/20/1971  . Smokeless tobacco: Never Used  . Alcohol Use: Yes     Comment: occasional  . Drug Use: No  . Sexual Activity: None   Other Topics Concern  . None   Social History Narrative   Patient lives at home with husband Pilar Plate.    Family History  Problem Relation Age of Onset  . Dementia Mother   . Cancer - Colon Mother   . Cancer - Ovarian Sister    Allergies  Allergen Reactions  . Codeine Nausea And Vomiting   Prior to Admission medications   Medication Sig Start Date End Date Taking? Authorizing Provider  aspirin 81 MG tablet Take 81 mg by mouth daily.   Yes Historical Provider, MD  calcium-vitamin D (OSCAL WITH D) 500-200  MG-UNIT per tablet Take 1 tablet by mouth 2 (two) times daily.    Yes Historical Provider, MD  celecoxib (CELEBREX) 200 MG capsule Take 200 mg by mouth 2 (two) times daily.   Yes Historical Provider, MD  donepezil (ARICEPT) 10 MG tablet TAKE 1 TABLET DAILY 06/30/14  Yes Larey Seat, MD  esomeprazole (NEXIUM) 40 MG capsule Take 40 mg by mouth daily before breakfast.   Yes Historical Provider, MD  NAMENDA XR 28 MG CP24 TAKE 1 CAPSULE DAILY 09/24/14  Yes Larey Seat, MD  rosuvastatin (CRESTOR) 20 MG tablet Take 20 mg by mouth daily.   Yes Historical Provider, MD  Vitamin D, Ergocalciferol, (DRISDOL) 50000 UNITS CAPS Take 50,000 Units by mouth every Sunday.    Yes Historical Provider, MD     Positive ROS: All other systems have been reviewed and were otherwise negative with the exception of those mentioned in the HPI and as above.  Physical Exam: General: Alert, no acute distress Cardiovascular: No pedal edema Respiratory: No cyanosis, no use of accessory musculature GI: No organomegaly, abdomen is soft and non-tender Skin: No lesions in the area of chief complaint Neurologic: Sensation intact distally Psychiatric: Patient is competent for consent with normal mood and affect Lymphatic: No axillary  or cervical lymphadenopathy  MUSCULOSKELETAL: left knee with crepitance and loss of motion.  XR with end stage DJD, osteophytes, loss of joint space.  Assessment: djd left knee  Plan: Plan for Procedure(s): TOTAL KNEE ARTHROPLASTY  The risks benefits and alternatives were discussed with the patient including but not limited to the risks of nonoperative treatment, versus surgical intervention including infection, bleeding, nerve injury,  blood clots, cardiopulmonary complications, morbidity, mortality, among others, and they were willing to proceed.   Johnny Bridge, MD Cell [phone]   10/27/2014 7:11 AM

## 2014-10-27 NOTE — Anesthesia Postprocedure Evaluation (Signed)
  Anesthesia Post-op Note  Patient: Alexis York  Procedure(s) Performed: Procedure(s): TOTAL KNEE ARTHROPLASTY (Left)  Patient Location: PACU  Anesthesia Type:General  Level of Consciousness: awake  Airway and Oxygen Therapy: Patient Spontanous Breathing  Post-op Pain: mild  Post-op Assessment: Post-op Vital signs reviewed  Post-op Vital Signs: Reviewed  Last Vitals:  Filed Vitals:   10/27/14 0939  BP: 136/52  Pulse: 66  Temp:   Resp: 14    Complications: No apparent anesthesia complications

## 2014-10-28 ENCOUNTER — Encounter (HOSPITAL_COMMUNITY): Payer: Self-pay | Admitting: General Practice

## 2014-10-28 LAB — BASIC METABOLIC PANEL
Anion gap: 9 (ref 5–15)
BUN: 15 mg/dL (ref 6–23)
CO2: 26 mmol/L (ref 19–32)
Calcium: 8.2 mg/dL — ABNORMAL LOW (ref 8.4–10.5)
Chloride: 101 mEq/L (ref 96–112)
Creatinine, Ser: 0.89 mg/dL (ref 0.50–1.10)
GFR calc non Af Amer: 55 mL/min — ABNORMAL LOW (ref >90)
GFR, EST AFRICAN AMERICAN: 64 mL/min — AB (ref >90)
Glucose, Bld: 112 mg/dL — ABNORMAL HIGH (ref 70–99)
POTASSIUM: 4.6 mmol/L (ref 3.5–5.1)
SODIUM: 136 mmol/L (ref 135–145)

## 2014-10-28 LAB — CBC
HCT: 30.4 % — ABNORMAL LOW (ref 36.0–46.0)
Hemoglobin: 9.6 g/dL — ABNORMAL LOW (ref 12.0–15.0)
MCH: 28.5 pg (ref 26.0–34.0)
MCHC: 31.6 g/dL (ref 30.0–36.0)
MCV: 90.2 fL (ref 78.0–100.0)
Platelets: 202 10*3/uL (ref 150–400)
RBC: 3.37 MIL/uL — AB (ref 3.87–5.11)
RDW: 14.1 % (ref 11.5–15.5)
WBC: 8.3 10*3/uL (ref 4.0–10.5)

## 2014-10-28 NOTE — Progress Notes (Signed)
OT Cancellation Note  Patient Details Name: Alexis York MRN: [id number] DOB: 12-Jul-1922   Cancelled Treatment:    Reason Eval/Treat Not Completed: Other (comment) Pt is Medicare and current D/C plan is SNF. No apparent immediate acute care OT needs, therefore will defer OT to SNF. If OT eval is needed please call Acute Rehab Dept. at [phone] or text page OT at [phone].    Almon Register [phone] 10/28/2014, 11:07 AM

## 2014-10-28 NOTE — Progress Notes (Signed)
     Subjective:  Patient reports pain as mild, but become severe with movement.  Her mental function is at baseline, and she is overall doing well according to her husband. She interacts with me normally today.  Objective:   VITALS:   Filed Vitals:   10/27/14 1115 10/27/14 2047 10/28/14 0136 10/28/14 0500  BP: 148/55 127/50 115/44 107/38  Pulse: 73 94 90 84  Temp: 97.8 F (36.6 C) 98 F (36.7 C) 98 F (36.7 C) 98.2 F (36.8 C)  TempSrc:  Oral Oral Oral  Resp: 16 18 16 16   Height:      Weight:      SpO2: 93% 95% 94% 93%    Neurologically intact Neurovascular intact Dorsiflexion/Plantar flexion intact Incision: dressing C/D/I   Lab Results  Component Value Date   WBC 8.3 10/28/2014   HGB 9.6* 10/28/2014   HCT 30.4* 10/28/2014   MCV 90.2 10/28/2014   PLT 202 10/28/2014   BMET    Component Value Date/Time   NA 136 10/28/2014 0521   K 4.6 10/28/2014 0521   CL 101 10/28/2014 0521   CO2 26 10/28/2014 0521   GLUCOSE 112* 10/28/2014 0521   BUN 15 10/28/2014 0521   CREATININE 0.89 10/28/2014 0521   CALCIUM 8.2* 10/28/2014 0521   GFRNONAA 55* 10/28/2014 0521   GFRAA 64* 10/28/2014 0521     Assessment/Plan: 1 Day Post-Op   Principal Problem:   Primary osteoarthritis of left knee Active Problems:   Knee osteoarthritis   mild acute blood loss anemia, observed. Advance diet Up with therapy Discharge to SNF    Alexis York P 10/28/2014, 10:11 AM   Marchia Bond, MD Cell [phone]

## 2014-10-28 NOTE — Clinical Social Work Psychosocial (Signed)
Clinical Social Work Department BRIEF PSYCHOSOCIAL ASSESSMENT 10/28/2014  Patient:  Alexis York, Alexis York     Account Number:  [id number]     Admit date:  10/27/2014  Clinical Social Worker:  Alexis York  Date/Time:  10/28/2014 03:05 PM  Referred by:  Physician  Date Referred:  10/28/2014 Referred for  SNF Placement   Other Referral:   none.   Interview type:  Patient Other interview type:   none.    PSYCHOSOCIAL DATA Living Status:  HUSBAND Admitted from facility:   Level of care:   Primary support name:  Alexis York Primary support relationship to patient:  SPOUSE Degree of support available:   Strong support system.    CURRENT CONCERNS Current Concerns  Post-Acute Placement   Other Concerns:   none.    SOCIAL WORK ASSESSMENT / PLAN CSW met with pt at bedside to discuss discharge diposition. Pt informed CSW pt is pre-registered with [facility]. Pt reports pt lives with pt's husband.    CSW to continue to follow and assist with discharge planning needs.   Assessment/plan status:  Psychosocial Support/Ongoing Assessment of Needs Other assessment/ plan:   none.   Information/referral to community resources:   Pt to be discharged to [facility].    PATIENT'S/FAMILY'S RESPONSE TO PLAN OF CARE: Pt understanding and agreeable to CSW plan of care. Pt expressed no further questions or concerns at this time.       Alexis York, [place] [phone]) Licensed Clinical Social Worker Orthopedics [phone]) and Surgical [phone])

## 2014-10-28 NOTE — Discharge Instructions (Signed)
Diet: As you were doing prior to hospitalization   Shower:  May shower but keep the wounds dry, use an occlusive plastic wrap, NO SOAKING IN TUB.  If the bandage gets wet, change with a clean dry gauze.  Dressing:  You may change your dressing 3-5 days after surgery.  Then change the dressing daily with sterile gauze dressing.    There are sticky tapes (steri-strips) on your wounds and all the stitches are absorbable.  Leave the steri-strips in place when changing your dressings, they will peel off with time, usually 2-3 weeks.  Activity:  Increase activity slowly as tolerated, but follow the weight bearing instructions below.  No lifting or driving for 6 weeks.  Weight Bearing:   As tolerated.    To prevent constipation: you may use a stool softener such as -  Colace (over the counter) 100 mg by mouth twice a day  Drink plenty of fluids (prune juice may be helpful) and high fiber foods Miralax (over the counter) for constipation as needed.    Itching:  If you experience itching with your medications, try taking only a single pain pill, or even half a pain pill at a time.  You may take up to 10 pain pills per day, and you can also use benadryl over the counter for itching or also to help with sleep.   Precautions:  If you experience chest pain or shortness of breath - call 911 immediately for transfer to the hospital emergency department!!  If you develop a fever greater that 101 F, purulent drainage from wound, increased redness or drainage from wound, or calf pain -- Call the office at [phone]                                                Follow- Up Appointment:  Please call for an appointment to be seen in 2 weeks [place] - [phone]  Information on my medicine - XARELTO (Rivaroxaban)  This medication education was reviewed with me or my healthcare representative as part of my discharge preparation.   Why was Xarelto prescribed for you? Xarelto was prescribed for you  to reduce the risk of blood clots forming after orthopedic surgery. The medical term for these abnormal blood clots is venous thromboembolism (VTE).  What do you need to know about xarelto ? Take your Xarelto ONCE DAILY at the same time every day. You may take it either with or without food.  If you have difficulty swallowing the tablet whole, you may crush it and mix in applesauce just prior to taking your dose.  Take Xarelto exactly as prescribed by your doctor and DO NOT stop taking Xarelto without talking to the doctor who prescribed the medication.  Stopping without other VTE prevention medication to take the place of Xarelto may increase your risk of developing a clot.  After discharge, you should have regular check-up appointments with your healthcare provider that is prescribing your Xarelto.    What do you do if you miss a dose? If you miss a dose, take it as soon as you remember on the same day then continue your regularly scheduled once daily regimen the next day. Do not take two doses of Xarelto on the same day.   Important Safety Information A possible side effect of Xarelto is bleeding. You should call your healthcare provider right  away if you experience any of the following: ? Bleeding from an injury or your nose that does not stop. ? Unusual colored urine (red or dark brown) or unusual colored stools (red or black). ? Unusual bruising for unknown reasons. ? A serious fall or if you hit your head (even if there is no bleeding).  Some medicines may interact with Xarelto and might increase your risk of bleeding while on Xarelto. To help avoid this, consult your healthcare provider or pharmacist prior to using any new prescription or non-prescription medications, including herbals, vitamins, non-steroidal anti-inflammatory drugs (NSAIDs) and supplements.  This website has more information on Xarelto: [email or web address]

## 2014-10-28 NOTE — Progress Notes (Signed)
Physical Therapy Treatment Patient Details Name: Alexis York MRN: [id number] DOB: 10-20-1922 Today's Date: 10/28/2014    History of Present Illness Pt admitted 12/21 for elective L TKA. Pt with h/o short term memory loss, masectomy, and bilat hip replacements.    PT Comments    Pt's impaired cognition and severe short term memory deficits as well as L Knee pain is greatly inhibiting ability to progress OOB mobility or recall there ex. Pt asked where her spouse was 5 times during PT session and con't to be unaware she had surgery on her L knee and wants to know "why does my knee hurt so much, did I fall on it or something?"  PT to con't to follow to progress as able.    Follow Up Recommendations  SNF;Supervision/Assistance - 24 hour     Equipment Recommendations       Recommendations for Other Services       Precautions / Restrictions Precautions Precautions: Knee;Fall Required Braces or Orthoses: Knee Immobilizer - Left Knee Immobilizer - Left: On when out of bed or walking Restrictions Weight Bearing Restrictions: Yes LLE Weight Bearing: Weight bearing as tolerated    Mobility  Bed Mobility Overal bed mobility: Needs Assistance;+2 for physical assistance Bed Mobility: Supine to Sit     Supine to sit: Mod assist;+2 for physical assistance     General bed mobility comments: max directional v/c's to calm down and for technique. pt initiated trunk elevation but unable to manage L LE without maxA  Transfers Overall transfer level: Needs assistance Equipment used: Rolling walker (2 wheeled) Transfers: Sit to/from Stand Sit to Stand: Mod assist;+2 physical assistance         General transfer comment: pt extremely anxious re: L knee pain. assist to transition weight forward. v/cs to push up with R LE and bilat UEs. pt able to move L LE posteriorly for optimal position once in standing but unable to WB  Ambulation/Gait Ambulation/Gait assistance: Max assist;+2  physical assistance Ambulation Distance (Feet): 2 Feet Assistive device: Rolling walker (2 wheeled) Gait Pattern/deviations: Step-to pattern;Antalgic Gait velocity: slow   General Gait Details: pt extremely anxious and screaming in L knee pain. pt did take 5 shufflled steps. pt unable to focus on directions or recall task at hand   Stairs            Wheelchair Mobility    Modified Rankin (Stroke Patients Only)       Balance           Standing balance support: Bilateral upper extremity supported Standing balance-Leahy Scale: Poor Standing balance comment: requires physical assist and RW at this time                     Cognition Arousal/Alertness: Awake/alert Behavior During Therapy: Anxious Overall Cognitive Status: History of cognitive impairments - at baseline (pt unaware she had L TKA or why her L knee hurts)       Memory: Decreased short-term memory (exteremely poor memory, unable to recall s/p 5 min)              Exercises Total Joint Exercises Ankle Circles/Pumps: AROM;Both;10 reps;Supine Quad Sets: AROM;Both;10 reps;Supine Heel Slides: AAROM;Left;10 reps;Supine    General Comments        Pertinent Vitals/Pain Pain Assessment: 0-10 Pain Score: 10-Worst pain ever Pain Location: L knee during OOB mobility Pain Intervention(s): Premedicated before session    Home Living  Prior Function            PT Goals (current goals can now be found in the care plan section) Progress towards PT goals: Progressing toward goals    Frequency  7X/week    PT Plan Current plan remains appropriate    Co-evaluation             End of Session Equipment Utilized During Treatment: Gait belt;Left knee immobilizer Activity Tolerance: Patient limited by pain Patient left: in chair;with call bell/phone within reach;with family/visitor present     Time: 1432-1450 PT Time Calculation (min) (ACUTE ONLY): 18  min  Charges:  $Gait Training: 8-22 mins                    G Codes:      Kingsley Callander 10/28/2014, 4:12 PM   Kittie Plater, PT, DPT Pager #: [phone] Office #: [phone]

## 2014-10-28 NOTE — Evaluation (Signed)
Physical Therapy Evaluation Patient Details Name: Alexis York MRN: [id number] DOB: 02-17-1922 Today's Date: 10/28/2014   History of Present Illness  Pt admitted 12/21 for elective L TKA. Pt with h/o short term memory loss, masectomy, and bilat hip replacements.  Clinical Impression  Pt is s/p TKA resulting in the deficits listed below (see PT Problem List). Pt extremely anxious greatly limiting OOB mobility progression. Pt also with significant short term memory deficits limiting carryover of directions and there ex. Pt unable to ambulate this date however was able to complete std pvt to bsc and to chair.  Pt will benefit from skilled PT to increase their independence and safety with mobility to allow discharge to the venue listed below.      Follow Up Recommendations SNF;Supervision/Assistance - 24 hour    Equipment Recommendations   (defer to SNF)    Recommendations for Other Services       Precautions / Restrictions Precautions Precautions: Knee;Fall Required Braces or Orthoses: Knee Immobilizer - Left Knee Immobilizer - Left: On when out of bed or walking Restrictions Weight Bearing Restrictions: Yes LLE Weight Bearing: Weight bearing as tolerated      Mobility  Bed Mobility Overal bed mobility: Needs Assistance;+2 for physical assistance Bed Mobility: Supine to Sit     Supine to sit: Mod assist;+2 for physical assistance     General bed mobility comments: max directional v/c's to calm down and for technique. pt initiated trunk elevation but unable to manage L LE without maxA  Transfers Overall transfer level: Needs assistance Equipment used: Rolling walker (2 wheeled) Transfers: Sit to/from [facility] Sit to Stand: Mod assist;+2 physical assistance Stand pivot transfers: Mod assist;+2 physical assistance       General transfer comment: pt extremely anxious, max directional v/c's, assist for walker management. pt able to pvt on R LE. pt  completed 5 sit to stands. Attempted to take a step however pt became extremely anxious and would not try. Pt completed one shuffled step.  Ambulation/Gait             General Gait Details: pt unable to tolerate ambulation this date.  Stairs            Wheelchair Mobility    Modified Rankin (Stroke Patients Only)       Balance Overall balance assessment: Needs assistance Sitting-balance support: Bilateral upper extremity supported;Feet supported   Sitting balance - Comments: posterior lean due to L LE inability to bend   Standing balance support: Bilateral upper extremity supported Standing balance-Leahy Scale: Poor Standing balance comment: relies on RW currently                             Pertinent Vitals/Pain Pain Assessment: 0-10 Pain Score: 8  Pain Location: L knee Pain Intervention(s): Premedicated before session    Home Living Family/patient expects to be discharged to:: Skilled nursing facility Living Arrangements: Spouse/significant other                    Prior Function Level of Independence: Independent               Hand Dominance   Dominant Hand: Right    Extremity/Trunk Assessment   Upper Extremity Assessment: Overall WFL for tasks assessed           Lower Extremity Assessment: LLE deficits/detail   LLE Deficits / Details: pt able to initiate quad set and AA flexion  of knee  Cervical / Trunk Assessment: Kyphotic  Communication   Communication: HOH  Cognition Arousal/Alertness: Awake/alert Behavior During Therapy: Anxious Overall Cognitive Status: History of cognitive impairments - at baseline       Memory: Decreased short-term memory              General Comments General comments (skin integrity, edema, etc.): Pt assisted to [facility]. pt dependent for hygiene    Exercises Total Joint Exercises Ankle Circles/Pumps: AROM;Both;10 reps;Supine Quad Sets: AROM;Both;10 reps;Supine Heel Slides:  AAROM;Left;10 reps;Supine Goniometric ROM: 30 deg active L knee flexion      Assessment/Plan    PT Assessment Patient needs continued PT services  PT Diagnosis Difficulty walking;Acute pain;Generalized weakness   PT Problem List Decreased strength;Decreased range of motion;Decreased activity tolerance;Decreased balance  PT Treatment Interventions DME instruction;Gait training;Functional mobility training;Therapeutic activities;Therapeutic exercise   PT Goals (Current goals can be found in the Care Plan section) Acute Rehab PT Goals PT Goal Formulation: With patient/family Time For Goal Achievement: 11/11/14 Potential to Achieve Goals: Fair    Frequency 7X/week   Barriers to discharge        Co-evaluation               End of Session Equipment Utilized During Treatment: Gait belt;Left knee immobilizer Activity Tolerance: Patient tolerated treatment well Patient left: in chair;with call bell/phone within reach;with family/visitor present Nurse Communication: Mobility status         Time: 1027-1053 PT Time Calculation (min) (ACUTE ONLY): 26 min   Charges:   PT Evaluation $Initial PT Evaluation Tier I: 1 Procedure PT Treatments $Therapeutic Activity: 8-22 mins   PT G Codes:          Kingsley Callander 10/28/2014, 1:30 PM   Kittie Plater, PT, DPT Pager #: [phone] Office #: [phone]

## 2014-10-28 NOTE — Clinical Social Work Placement (Addendum)
Clinical Social Work Department CLINICAL SOCIAL WORK PLACEMENT NOTE 10/28/2014  Patient:  Alexis York, Alexis York  Account Number:  [id number] Admit date:  10/27/2014  Clinical Social Worker:  Delrae Sawyers  Date/time:  10/28/2014 03:15 PM  Clinical Social Work is seeking post-discharge placement for this patient at the following level of care:   [place]   (*CSW will update this form in Epic as items are completed)   10/28/2014  Patient/family provided with [place] Department of Clinical Social Work's list of facilities offering this level of care within the geographic area requested by the patient (or if unable, by the patient's family).  10/28/2014  Patient/family informed of their freedom to choose among providers that offer the needed level of care, that participate in Medicare, Medicaid or managed care program needed by the patient, have an available bed and are willing to accept the patient.  10/28/2014  Patient/family informed of [facility]' ownership interest in [facility], as well as of the fact that they are under no obligation to receive care at this facility.  PASARR submitted to EDS on 10/28/2014 PASARR number received on 10/28/2014  FL2 transmitted to all facilities in geographic area requested by pt/family on  10/28/2014 FL2 transmitted to all facilities within larger geographic area on   Patient informed that his/her managed care company has contracts with or will negotiate with  certain facilities, including the following:     Patient/family informed of bed offers received:  10/28/2014 Patient chooses bed at [place] Physician recommends and patient chooses bed at    Patient to be transferred to [place] on 10/29/2014  Patient to be transferred to facility by [facility] Patient and family notified of transfer on 10/29/2014 Name of family member notified:  Patient husband at bedside  The following  physician request were entered in Epic:   Additional Comments:  Lubertha Sayres, Latanya Presser ([phone]) Licensed Clinical Social Worker Orthopedics [phone]) and Surgical [phone])

## 2014-10-29 DIAGNOSIS — D649 Anemia, unspecified: Secondary | ICD-10-CM | POA: Diagnosis not present

## 2014-10-29 DIAGNOSIS — M2550 Pain in unspecified joint: Secondary | ICD-10-CM | POA: Diagnosis not present

## 2014-10-29 DIAGNOSIS — M179 Osteoarthritis of knee, unspecified: Secondary | ICD-10-CM | POA: Diagnosis not present

## 2014-10-29 DIAGNOSIS — M25562 Pain in left knee: Secondary | ICD-10-CM | POA: Diagnosis not present

## 2014-10-29 DIAGNOSIS — K047 Periapical abscess without sinus: Secondary | ICD-10-CM | POA: Diagnosis not present

## 2014-10-29 DIAGNOSIS — M6281 Muscle weakness (generalized): Secondary | ICD-10-CM | POA: Diagnosis not present

## 2014-10-29 DIAGNOSIS — I888 Other nonspecific lymphadenitis: Secondary | ICD-10-CM | POA: Diagnosis not present

## 2014-10-29 DIAGNOSIS — L509 Urticaria, unspecified: Secondary | ICD-10-CM | POA: Diagnosis not present

## 2014-10-29 DIAGNOSIS — R269 Unspecified abnormalities of gait and mobility: Secondary | ICD-10-CM | POA: Diagnosis not present

## 2014-10-29 DIAGNOSIS — K5900 Constipation, unspecified: Secondary | ICD-10-CM | POA: Diagnosis not present

## 2014-10-29 DIAGNOSIS — S83105A Unspecified dislocation of left knee, initial encounter: Secondary | ICD-10-CM | POA: Diagnosis not present

## 2014-10-29 DIAGNOSIS — R279 Unspecified lack of coordination: Secondary | ICD-10-CM | POA: Diagnosis not present

## 2014-10-29 DIAGNOSIS — I480 Paroxysmal atrial fibrillation: Secondary | ICD-10-CM | POA: Diagnosis not present

## 2014-10-29 DIAGNOSIS — F0150 Vascular dementia without behavioral disturbance: Secondary | ICD-10-CM | POA: Diagnosis not present

## 2014-10-29 DIAGNOSIS — E559 Vitamin D deficiency, unspecified: Secondary | ICD-10-CM | POA: Diagnosis not present

## 2014-10-29 DIAGNOSIS — Z471 Aftercare following joint replacement surgery: Secondary | ICD-10-CM | POA: Diagnosis not present

## 2014-10-29 DIAGNOSIS — D62 Acute posthemorrhagic anemia: Secondary | ICD-10-CM | POA: Diagnosis not present

## 2014-10-29 DIAGNOSIS — R300 Dysuria: Secondary | ICD-10-CM | POA: Diagnosis not present

## 2014-10-29 DIAGNOSIS — K219 Gastro-esophageal reflux disease without esophagitis: Secondary | ICD-10-CM | POA: Diagnosis not present

## 2014-10-29 DIAGNOSIS — M810 Age-related osteoporosis without current pathological fracture: Secondary | ICD-10-CM | POA: Diagnosis not present

## 2014-10-29 DIAGNOSIS — M25862 Other specified joint disorders, left knee: Secondary | ICD-10-CM | POA: Diagnosis not present

## 2014-10-29 DIAGNOSIS — Z96652 Presence of left artificial knee joint: Secondary | ICD-10-CM | POA: Diagnosis not present

## 2014-10-29 DIAGNOSIS — M150 Primary generalized (osteo)arthritis: Secondary | ICD-10-CM | POA: Diagnosis not present

## 2014-10-29 DIAGNOSIS — F0390 Unspecified dementia without behavioral disturbance: Secondary | ICD-10-CM | POA: Diagnosis not present

## 2014-10-29 DIAGNOSIS — E785 Hyperlipidemia, unspecified: Secondary | ICD-10-CM | POA: Diagnosis not present

## 2014-10-29 LAB — URINALYSIS, ROUTINE W REFLEX MICROSCOPIC
Bilirubin Urine: NEGATIVE
GLUCOSE, UA: NEGATIVE mg/dL
Hgb urine dipstick: NEGATIVE
Ketones, ur: NEGATIVE mg/dL
Nitrite: NEGATIVE
Protein, ur: NEGATIVE mg/dL
SPECIFIC GRAVITY, URINE: 1.009 (ref 1.005–1.030)
UROBILINOGEN UA: 0.2 mg/dL (ref 0.0–1.0)
pH: 6 (ref 5.0–8.0)

## 2014-10-29 LAB — CBC
HEMATOCRIT: 28.1 % — AB (ref 36.0–46.0)
HEMOGLOBIN: 9 g/dL — AB (ref 12.0–15.0)
MCH: 28.9 pg (ref 26.0–34.0)
MCHC: 32 g/dL (ref 30.0–36.0)
MCV: 90.4 fL (ref 78.0–100.0)
Platelets: 203 10*3/uL (ref 150–400)
RBC: 3.11 MIL/uL — ABNORMAL LOW (ref 3.87–5.11)
RDW: 14.2 % (ref 11.5–15.5)
WBC: 9.8 10*3/uL (ref 4.0–10.5)

## 2014-10-29 LAB — URINE MICROSCOPIC-ADD ON

## 2014-10-29 NOTE — Discharge Summary (Signed)
Physician Discharge Summary  Patient ID: Alexis York MRN: [id number] DOB/AGE: 1921-12-24 80 y.o.  Admit date: 10/27/2014 Discharge date: 10/29/2014  Admission Diagnoses:  Primary osteoarthritis of left knee  Discharge Diagnoses:  Principal Problem:   Primary osteoarthritis of left knee Active Problems:   Knee osteoarthritis   Past Medical History  Diagnosis Date  . Cancer 1973    left mastectomy  . Osteoporosis   . Hypercholesteremia   . Arthritis   . Vitamin D deficiency   . Memory impairment     short term memory loss  . GERD (gastroesophageal reflux disease)   . Primary osteoarthritis of left knee 10/27/2014    Surgeries: Procedure(s): TOTAL KNEE ARTHROPLASTY on 10/27/2014   Consultants (if any):    Discharged Condition: Improved  Hospital Course: Alexis York is an 80 y.o. female who was admitted 10/27/2014 with a diagnosis of Primary osteoarthritis of left knee and went to the operating room on 10/27/2014 and underwent the above named procedures.    She was given perioperative antibiotics:  Anti-infectives    Start     Dose/Rate Route Frequency Ordered Stop   10/27/14 1400  ceFAZolin (ANCEF) IVPB 2 g/50 mL premix     2 g100 mL/hr over 30 Minutes Intravenous Every 6 hours 10/27/14 1112 10/27/14 2003   10/27/14 0600  ceFAZolin (ANCEF) IVPB 2 g/50 mL premix     2 g100 mL/hr over 30 Minutes Intravenous On call to O.R. 10/26/14 1240 10/27/14 0729    .  She was given sequential compression devices, early ambulation, and xarelto for DVT prophylaxis.  She benefited maximally from the hospital stay and there were no complications.  We have been minimizing medications and narcotics to avoid delirium.  Tylenol as first line pain agent.  Recent vital signs:  Filed Vitals:   10/29/14 0552  BP: 139/56  Pulse: 79  Temp: 98.7 F (37.1 C)  Resp: 16    Recent laboratory studies:  Lab Results  Component Value Date   HGB 9.0* 10/29/2014   HGB 9.6*  10/28/2014   HGB 14.3 10/16/2014   Lab Results  Component Value Date   WBC 9.8 10/29/2014   PLT 203 10/29/2014   Lab Results  Component Value Date   INR 0.97 10/16/2014   Lab Results  Component Value Date   NA 136 10/28/2014   K 4.6 10/28/2014   CL 101 10/28/2014   CO2 26 10/28/2014   BUN 15 10/28/2014   CREATININE 0.89 10/28/2014   GLUCOSE 112* 10/28/2014    Discharge Medications:     Medication List    STOP taking these medications        aspirin 81 MG tablet      TAKE these medications        acetaminophen 325 MG tablet  Commonly known as:  TYLENOL  Take 2 tablets (650 mg total) by mouth every 6 (six) hours as needed for mild pain or moderate pain.     baclofen 10 MG tablet  Commonly known as:  LIORESAL  Take 1 tablet (10 mg total) by mouth 3 (three) times daily. As needed for muscle spasm     calcium-vitamin D 500-200 MG-UNIT per tablet  Commonly known as:  OSCAL WITH D  Take 1 tablet by mouth 2 (two) times daily.     celecoxib 200 MG capsule  Commonly known as:  CELEBREX  Take 200 mg by mouth 2 (two) times daily.     donepezil 10 MG tablet  Commonly known as:  ARICEPT  TAKE 1 TABLET DAILY     esomeprazole 40 MG capsule  Commonly known as:  NEXIUM  Take 40 mg by mouth daily before breakfast.     HYDROcodone-acetaminophen 5-325 MG per tablet  Commonly known as:  NORCO  Take 1-2 tablets by mouth every 6 (six) hours as needed for severe pain. MAXIMUM TOTAL ACETAMINOPHEN DOSE IS 4000 MG PER DAY     NAMENDA XR 28 MG Cp24  Generic drug:  Memantine HCl ER  TAKE 1 CAPSULE DAILY     rivaroxaban 10 MG Tabs tablet  Commonly known as:  XARELTO  Take 1 tablet (10 mg total) by mouth daily.     rosuvastatin 20 MG tablet  Commonly known as:  CRESTOR  Take 20 mg by mouth daily.     sennosides-docusate sodium 8.6-50 MG tablet  Commonly known as:  SENOKOT-S  Take 2 tablets by mouth daily.     Vitamin D (Ergocalciferol) 50000 UNITS Caps capsule  Commonly  known as:  DRISDOL  Take 50,000 Units by mouth every Sunday.        Diagnostic Studies: Dg Knee Left Port  10/27/2014   CLINICAL DATA:  Status post left knee replacement  EXAM: PORTABLE LEFT KNEE - 1-2 VIEW  COMPARISON:  None.  FINDINGS: A left knee prosthesis is noted. No acute bony abnormality is seen. Diffuse vascular calcifications are noted.  IMPRESSION: Status post knee replacement without acute abnormality.   Electronically Signed   By: Inez Catalina M.D.   On: 10/27/2014 10:13    Disposition: 03-Skilled Nursing Facility        Follow-up Information    Follow up with Johnny Bridge, MD. Schedule an appointment as soon as possible for a visit in 2 weeks.   Specialty:  Orthopedic Surgery   Contact information:   [facility] [place] [address] [phone]        Signed: Johnny Bridge 10/29/2014, 9:18 AM

## 2014-10-29 NOTE — Clinical Social Work Note (Signed)
Clinical Social Worker facilitated patient discharge including contacting patient family and facility to confirm patient discharge plans.  Clinical information faxed to facility and family agreeable with plan.  CSW arranged ambulance transport via [facility] to [facility].  RN to call report prior to discharge.  Clinical Social Worker will sign off for now as social work intervention is no longer needed. Please consult us again if new need arises.  Jesse Aradhya Shellenbarger, LCSW [phone] 

## 2014-10-29 NOTE — Progress Notes (Signed)
Patient ID: Alexis York, female   DOB: 1922-03-26, 80 y.o.   MRN: [id number]     Subjective:  Patient reports pain as mild to moderate.  Patient states that she is doing okay and is mildly confused.  Denies any CP or SOB  Objective:   VITALS:   Filed Vitals:   10/28/14 2200 10/29/14 0000 10/29/14 0400 10/29/14 0552  BP: 133/56   139/56  Pulse: 99   79  Temp: 98.4 F (36.9 C)   98.7 F (37.1 C)  TempSrc:      Resp: 16 17 18 16   Height:      Weight:      SpO2: 95%   97%    ABD soft Sensation intact distally Dorsiflexion/Plantar flexion intact Incision: dressing C/D/I and no drainage Good foot and ankle motion   Lab Results  Component Value Date   WBC 9.8 10/29/2014   HGB 9.0* 10/29/2014   HCT 28.1* 10/29/2014   MCV 90.4 10/29/2014   PLT 203 10/29/2014   BMET    Component Value Date/Time   NA 136 10/28/2014 0521   K 4.6 10/28/2014 0521   CL 101 10/28/2014 0521   CO2 26 10/28/2014 0521   GLUCOSE 112* 10/28/2014 0521   BUN 15 10/28/2014 0521   CREATININE 0.89 10/28/2014 0521   CALCIUM 8.2* 10/28/2014 0521   GFRNONAA 55* 10/28/2014 0521   GFRAA 64* 10/28/2014 0521     Assessment/Plan: 2 Days Post-Op   Principal Problem:   Primary osteoarthritis of left knee Active Problems:   Knee osteoarthritis   Advance diet Up with therapy WBAT ABLA continue to monitor dry dressing PRN Plan for SNF   DOUGLAS PARRY, BRANDON 10/29/2014, 8:00 AM  Seen and agree.  SNF today.    Marchia Bond, MD Cell [phone]

## 2014-10-29 NOTE — Progress Notes (Signed)
Physical Therapy Treatment Patient Details Name: Alexis York MRN: [id number] DOB: [birth year] Today's Date: 10/29/2014    History of Present Illness Pt admitted 12/21 for elective L TKA. Pt with h/o short term memory loss, masectomy, and bilat hip replacements.    PT Comments    Pt progressing however remains limited due to severe dementia and short term memory loss. Pt con't to be unaware that she had L TKA and constantly asked "what did I do to my knee?" Pt with improved ambulation tolerance this date with max encouragement. Spouse 100% supportive of PT and encourages pt to participate. Pt to con't to benefit from SNF upon d/c for maximal functional recovery.   Follow Up Recommendations  SNF;Supervision/Assistance - 24 hour     Equipment Recommendations       Recommendations for Other Services       Precautions / Restrictions Precautions Precautions: Knee;Fall Required Braces or Orthoses: Knee Immobilizer - Left Knee Immobilizer - Left: On when out of bed or walking Restrictions Weight Bearing Restrictions: Yes LLE Weight Bearing: Weight bearing as tolerated    Mobility  Bed Mobility               General bed mobility comments: pt up in chair upon PT arrival  Transfers Overall transfer level: Needs assistance Equipment used: Rolling walker (2 wheeled) Transfers: Sit to/from Stand Sit to Stand: Max assist;+2 physical assistance         General transfer comment: pt extremely anxious re: L knee pain. assist to transition weight forward. v/cs to push up with R LE and bilat UEs. pt able to move L LE posteriorly for optimal position once in standing but unable to WB  Ambulation/Gait Ambulation/Gait assistance: Max assist;+2 physical assistance Ambulation Distance (Feet): 10 Feet (x1,  5x1) Assistive device: Rolling walker (2 wheeled) Gait Pattern/deviations: Step-to pattern;Shuffle Gait velocity: slow   General Gait Details: maxA at hips to maintain weight  forward to progress forward momentum. 2nd person to push chair and facilitate forward mvmt of walker. max v/c's to calm down due to pt screaming about L knee pain despite being able to walk on it. due to dementia pt very difficult to rationalize with and carryover instructions   Stairs            Wheelchair Mobility    Modified Rankin (Stroke Patients Only)       Balance Overall balance assessment: Needs assistance         Standing balance support: Bilateral upper extremity supported Standing balance-Leahy Scale: Zero Standing balance comment: pt requires maximal assist and RW                    Cognition Arousal/Alertness: Awake/alert Behavior During Therapy: Anxious Overall Cognitive Status: History of cognitive impairments - at baseline       Memory: Decreased short-term memory              Exercises      General Comments General comments (skin integrity, edema, etc.): attempted ther ex however pt would not participate      Pertinent Vitals/Pain Pain Assessment: Faces Pain Score: 0-No pain Faces Pain Scale: Hurts whole lot Pain Location: L knee with weight-bearing Pain Intervention(s): RN gave pain meds during session    Home Living                      Prior Function            PT  Goals (current goals can now be found in the care plan section) Progress towards PT goals: Progressing toward goals    Frequency  7X/week    PT Plan Current plan remains appropriate    Co-evaluation             End of Session Equipment Utilized During Treatment: Gait belt;Left knee immobilizer Activity Tolerance: Patient limited by pain Patient left: in chair;with call bell/phone within reach;with family/visitor present     Time: 9417-4081 PT Time Calculation (min) (ACUTE ONLY): 18 min  Charges:  $Gait Training: 8-22 mins                    G Codes:      Kingsley Callander 10/29/2014, 12:55 PM   Kittie Plater, PT,  DPT Pager #: [phone] Office #: [phone]

## 2014-10-29 NOTE — Progress Notes (Signed)
Called report yo Publishing copy at [facility]

## 2014-10-30 DIAGNOSIS — F0390 Unspecified dementia without behavioral disturbance: Secondary | ICD-10-CM | POA: Diagnosis not present

## 2014-10-30 DIAGNOSIS — D649 Anemia, unspecified: Secondary | ICD-10-CM | POA: Diagnosis not present

## 2014-10-30 DIAGNOSIS — M179 Osteoarthritis of knee, unspecified: Secondary | ICD-10-CM | POA: Diagnosis not present

## 2014-10-30 DIAGNOSIS — L509 Urticaria, unspecified: Secondary | ICD-10-CM | POA: Diagnosis not present

## 2014-11-01 DIAGNOSIS — R300 Dysuria: Secondary | ICD-10-CM | POA: Diagnosis not present

## 2014-11-05 DIAGNOSIS — Z96652 Presence of left artificial knee joint: Secondary | ICD-10-CM | POA: Diagnosis not present

## 2014-11-19 DIAGNOSIS — K047 Periapical abscess without sinus: Secondary | ICD-10-CM | POA: Diagnosis not present

## 2014-11-19 DIAGNOSIS — D649 Anemia, unspecified: Secondary | ICD-10-CM | POA: Diagnosis not present

## 2014-11-22 DIAGNOSIS — C50919 Malignant neoplasm of unspecified site of unspecified female breast: Secondary | ICD-10-CM | POA: Diagnosis not present

## 2014-11-22 DIAGNOSIS — Z7901 Long term (current) use of anticoagulants: Secondary | ICD-10-CM | POA: Diagnosis not present

## 2014-11-22 DIAGNOSIS — Z471 Aftercare following joint replacement surgery: Secondary | ICD-10-CM | POA: Diagnosis not present

## 2014-11-22 DIAGNOSIS — Z96652 Presence of left artificial knee joint: Secondary | ICD-10-CM | POA: Diagnosis not present

## 2014-11-22 DIAGNOSIS — E559 Vitamin D deficiency, unspecified: Secondary | ICD-10-CM | POA: Diagnosis not present

## 2014-11-22 DIAGNOSIS — M810 Age-related osteoporosis without current pathological fracture: Secondary | ICD-10-CM | POA: Diagnosis not present

## 2014-11-22 DIAGNOSIS — Z9181 History of falling: Secondary | ICD-10-CM | POA: Diagnosis not present

## 2014-11-22 DIAGNOSIS — R413 Other amnesia: Secondary | ICD-10-CM | POA: Diagnosis not present

## 2014-11-25 DIAGNOSIS — Z7901 Long term (current) use of anticoagulants: Secondary | ICD-10-CM | POA: Diagnosis not present

## 2014-11-25 DIAGNOSIS — R413 Other amnesia: Secondary | ICD-10-CM | POA: Diagnosis not present

## 2014-11-25 DIAGNOSIS — E559 Vitamin D deficiency, unspecified: Secondary | ICD-10-CM | POA: Diagnosis not present

## 2014-11-25 DIAGNOSIS — Z471 Aftercare following joint replacement surgery: Secondary | ICD-10-CM | POA: Diagnosis not present

## 2014-11-25 DIAGNOSIS — C50919 Malignant neoplasm of unspecified site of unspecified female breast: Secondary | ICD-10-CM | POA: Diagnosis not present

## 2014-11-25 DIAGNOSIS — M810 Age-related osteoporosis without current pathological fracture: Secondary | ICD-10-CM | POA: Diagnosis not present

## 2014-11-26 DIAGNOSIS — C50919 Malignant neoplasm of unspecified site of unspecified female breast: Secondary | ICD-10-CM | POA: Diagnosis not present

## 2014-11-26 DIAGNOSIS — Z7901 Long term (current) use of anticoagulants: Secondary | ICD-10-CM | POA: Diagnosis not present

## 2014-11-26 DIAGNOSIS — M810 Age-related osteoporosis without current pathological fracture: Secondary | ICD-10-CM | POA: Diagnosis not present

## 2014-11-26 DIAGNOSIS — E559 Vitamin D deficiency, unspecified: Secondary | ICD-10-CM | POA: Diagnosis not present

## 2014-11-26 DIAGNOSIS — Z471 Aftercare following joint replacement surgery: Secondary | ICD-10-CM | POA: Diagnosis not present

## 2014-11-26 DIAGNOSIS — R413 Other amnesia: Secondary | ICD-10-CM | POA: Diagnosis not present

## 2014-12-01 DIAGNOSIS — Z471 Aftercare following joint replacement surgery: Secondary | ICD-10-CM | POA: Diagnosis not present

## 2014-12-01 DIAGNOSIS — Z7901 Long term (current) use of anticoagulants: Secondary | ICD-10-CM | POA: Diagnosis not present

## 2014-12-01 DIAGNOSIS — M810 Age-related osteoporosis without current pathological fracture: Secondary | ICD-10-CM | POA: Diagnosis not present

## 2014-12-01 DIAGNOSIS — E559 Vitamin D deficiency, unspecified: Secondary | ICD-10-CM | POA: Diagnosis not present

## 2014-12-01 DIAGNOSIS — C50919 Malignant neoplasm of unspecified site of unspecified female breast: Secondary | ICD-10-CM | POA: Diagnosis not present

## 2014-12-01 DIAGNOSIS — R413 Other amnesia: Secondary | ICD-10-CM | POA: Diagnosis not present

## 2014-12-03 DIAGNOSIS — E559 Vitamin D deficiency, unspecified: Secondary | ICD-10-CM | POA: Diagnosis not present

## 2014-12-03 DIAGNOSIS — C50919 Malignant neoplasm of unspecified site of unspecified female breast: Secondary | ICD-10-CM | POA: Diagnosis not present

## 2014-12-03 DIAGNOSIS — M810 Age-related osteoporosis without current pathological fracture: Secondary | ICD-10-CM | POA: Diagnosis not present

## 2014-12-03 DIAGNOSIS — R413 Other amnesia: Secondary | ICD-10-CM | POA: Diagnosis not present

## 2014-12-03 DIAGNOSIS — Z7901 Long term (current) use of anticoagulants: Secondary | ICD-10-CM | POA: Diagnosis not present

## 2014-12-03 DIAGNOSIS — Z471 Aftercare following joint replacement surgery: Secondary | ICD-10-CM | POA: Diagnosis not present

## 2014-12-04 DIAGNOSIS — Z471 Aftercare following joint replacement surgery: Secondary | ICD-10-CM | POA: Diagnosis not present

## 2014-12-04 DIAGNOSIS — R413 Other amnesia: Secondary | ICD-10-CM | POA: Diagnosis not present

## 2014-12-04 DIAGNOSIS — Z7901 Long term (current) use of anticoagulants: Secondary | ICD-10-CM | POA: Diagnosis not present

## 2014-12-04 DIAGNOSIS — E559 Vitamin D deficiency, unspecified: Secondary | ICD-10-CM | POA: Diagnosis not present

## 2014-12-04 DIAGNOSIS — M810 Age-related osteoporosis without current pathological fracture: Secondary | ICD-10-CM | POA: Diagnosis not present

## 2014-12-04 DIAGNOSIS — C50919 Malignant neoplasm of unspecified site of unspecified female breast: Secondary | ICD-10-CM | POA: Diagnosis not present

## 2014-12-05 DIAGNOSIS — Z7901 Long term (current) use of anticoagulants: Secondary | ICD-10-CM | POA: Diagnosis not present

## 2014-12-05 DIAGNOSIS — M810 Age-related osteoporosis without current pathological fracture: Secondary | ICD-10-CM | POA: Diagnosis not present

## 2014-12-05 DIAGNOSIS — C50919 Malignant neoplasm of unspecified site of unspecified female breast: Secondary | ICD-10-CM | POA: Diagnosis not present

## 2014-12-05 DIAGNOSIS — R413 Other amnesia: Secondary | ICD-10-CM | POA: Diagnosis not present

## 2014-12-05 DIAGNOSIS — E559 Vitamin D deficiency, unspecified: Secondary | ICD-10-CM | POA: Diagnosis not present

## 2014-12-05 DIAGNOSIS — Z471 Aftercare following joint replacement surgery: Secondary | ICD-10-CM | POA: Diagnosis not present

## 2014-12-06 DIAGNOSIS — C50919 Malignant neoplasm of unspecified site of unspecified female breast: Secondary | ICD-10-CM | POA: Diagnosis not present

## 2014-12-06 DIAGNOSIS — R413 Other amnesia: Secondary | ICD-10-CM | POA: Diagnosis not present

## 2014-12-06 DIAGNOSIS — E559 Vitamin D deficiency, unspecified: Secondary | ICD-10-CM | POA: Diagnosis not present

## 2014-12-06 DIAGNOSIS — M810 Age-related osteoporosis without current pathological fracture: Secondary | ICD-10-CM | POA: Diagnosis not present

## 2014-12-06 DIAGNOSIS — Z7901 Long term (current) use of anticoagulants: Secondary | ICD-10-CM | POA: Diagnosis not present

## 2014-12-06 DIAGNOSIS — Z471 Aftercare following joint replacement surgery: Secondary | ICD-10-CM | POA: Diagnosis not present

## 2014-12-09 DIAGNOSIS — E559 Vitamin D deficiency, unspecified: Secondary | ICD-10-CM | POA: Diagnosis not present

## 2014-12-09 DIAGNOSIS — M810 Age-related osteoporosis without current pathological fracture: Secondary | ICD-10-CM | POA: Diagnosis not present

## 2014-12-09 DIAGNOSIS — R413 Other amnesia: Secondary | ICD-10-CM | POA: Diagnosis not present

## 2014-12-09 DIAGNOSIS — Z471 Aftercare following joint replacement surgery: Secondary | ICD-10-CM | POA: Diagnosis not present

## 2014-12-09 DIAGNOSIS — Z7901 Long term (current) use of anticoagulants: Secondary | ICD-10-CM | POA: Diagnosis not present

## 2014-12-09 DIAGNOSIS — C50919 Malignant neoplasm of unspecified site of unspecified female breast: Secondary | ICD-10-CM | POA: Diagnosis not present

## 2014-12-11 DIAGNOSIS — Z471 Aftercare following joint replacement surgery: Secondary | ICD-10-CM | POA: Diagnosis not present

## 2014-12-11 DIAGNOSIS — Z7901 Long term (current) use of anticoagulants: Secondary | ICD-10-CM | POA: Diagnosis not present

## 2014-12-11 DIAGNOSIS — R413 Other amnesia: Secondary | ICD-10-CM | POA: Diagnosis not present

## 2014-12-11 DIAGNOSIS — C50919 Malignant neoplasm of unspecified site of unspecified female breast: Secondary | ICD-10-CM | POA: Diagnosis not present

## 2014-12-11 DIAGNOSIS — M810 Age-related osteoporosis without current pathological fracture: Secondary | ICD-10-CM | POA: Diagnosis not present

## 2014-12-11 DIAGNOSIS — E559 Vitamin D deficiency, unspecified: Secondary | ICD-10-CM | POA: Diagnosis not present

## 2014-12-12 DIAGNOSIS — Z471 Aftercare following joint replacement surgery: Secondary | ICD-10-CM | POA: Diagnosis not present

## 2014-12-12 DIAGNOSIS — C50919 Malignant neoplasm of unspecified site of unspecified female breast: Secondary | ICD-10-CM | POA: Diagnosis not present

## 2014-12-12 DIAGNOSIS — Z7901 Long term (current) use of anticoagulants: Secondary | ICD-10-CM | POA: Diagnosis not present

## 2014-12-12 DIAGNOSIS — R413 Other amnesia: Secondary | ICD-10-CM | POA: Diagnosis not present

## 2014-12-12 DIAGNOSIS — M810 Age-related osteoporosis without current pathological fracture: Secondary | ICD-10-CM | POA: Diagnosis not present

## 2014-12-12 DIAGNOSIS — E559 Vitamin D deficiency, unspecified: Secondary | ICD-10-CM | POA: Diagnosis not present

## 2014-12-15 DIAGNOSIS — R413 Other amnesia: Secondary | ICD-10-CM | POA: Diagnosis not present

## 2014-12-15 DIAGNOSIS — C50919 Malignant neoplasm of unspecified site of unspecified female breast: Secondary | ICD-10-CM | POA: Diagnosis not present

## 2014-12-15 DIAGNOSIS — Z471 Aftercare following joint replacement surgery: Secondary | ICD-10-CM | POA: Diagnosis not present

## 2014-12-15 DIAGNOSIS — E559 Vitamin D deficiency, unspecified: Secondary | ICD-10-CM | POA: Diagnosis not present

## 2014-12-15 DIAGNOSIS — Z7901 Long term (current) use of anticoagulants: Secondary | ICD-10-CM | POA: Diagnosis not present

## 2014-12-15 DIAGNOSIS — M810 Age-related osteoporosis without current pathological fracture: Secondary | ICD-10-CM | POA: Diagnosis not present

## 2014-12-16 DIAGNOSIS — E559 Vitamin D deficiency, unspecified: Secondary | ICD-10-CM | POA: Diagnosis not present

## 2014-12-16 DIAGNOSIS — Z471 Aftercare following joint replacement surgery: Secondary | ICD-10-CM | POA: Diagnosis not present

## 2014-12-16 DIAGNOSIS — Z7901 Long term (current) use of anticoagulants: Secondary | ICD-10-CM | POA: Diagnosis not present

## 2014-12-16 DIAGNOSIS — R413 Other amnesia: Secondary | ICD-10-CM | POA: Diagnosis not present

## 2014-12-16 DIAGNOSIS — M810 Age-related osteoporosis without current pathological fracture: Secondary | ICD-10-CM | POA: Diagnosis not present

## 2014-12-16 DIAGNOSIS — C50919 Malignant neoplasm of unspecified site of unspecified female breast: Secondary | ICD-10-CM | POA: Diagnosis not present

## 2014-12-17 DIAGNOSIS — Z7901 Long term (current) use of anticoagulants: Secondary | ICD-10-CM | POA: Diagnosis not present

## 2014-12-17 DIAGNOSIS — C50919 Malignant neoplasm of unspecified site of unspecified female breast: Secondary | ICD-10-CM | POA: Diagnosis not present

## 2014-12-17 DIAGNOSIS — M810 Age-related osteoporosis without current pathological fracture: Secondary | ICD-10-CM | POA: Diagnosis not present

## 2014-12-17 DIAGNOSIS — E559 Vitamin D deficiency, unspecified: Secondary | ICD-10-CM | POA: Diagnosis not present

## 2014-12-17 DIAGNOSIS — Z471 Aftercare following joint replacement surgery: Secondary | ICD-10-CM | POA: Diagnosis not present

## 2014-12-17 DIAGNOSIS — R413 Other amnesia: Secondary | ICD-10-CM | POA: Diagnosis not present

## 2014-12-18 DIAGNOSIS — Z471 Aftercare following joint replacement surgery: Secondary | ICD-10-CM | POA: Diagnosis not present

## 2014-12-18 DIAGNOSIS — C50919 Malignant neoplasm of unspecified site of unspecified female breast: Secondary | ICD-10-CM | POA: Diagnosis not present

## 2014-12-18 DIAGNOSIS — E559 Vitamin D deficiency, unspecified: Secondary | ICD-10-CM | POA: Diagnosis not present

## 2014-12-18 DIAGNOSIS — R413 Other amnesia: Secondary | ICD-10-CM | POA: Diagnosis not present

## 2014-12-18 DIAGNOSIS — Z7901 Long term (current) use of anticoagulants: Secondary | ICD-10-CM | POA: Diagnosis not present

## 2014-12-18 DIAGNOSIS — M810 Age-related osteoporosis without current pathological fracture: Secondary | ICD-10-CM | POA: Diagnosis not present

## 2014-12-19 DIAGNOSIS — E559 Vitamin D deficiency, unspecified: Secondary | ICD-10-CM | POA: Diagnosis not present

## 2014-12-19 DIAGNOSIS — R413 Other amnesia: Secondary | ICD-10-CM | POA: Diagnosis not present

## 2014-12-19 DIAGNOSIS — C50919 Malignant neoplasm of unspecified site of unspecified female breast: Secondary | ICD-10-CM | POA: Diagnosis not present

## 2014-12-19 DIAGNOSIS — Z7901 Long term (current) use of anticoagulants: Secondary | ICD-10-CM | POA: Diagnosis not present

## 2014-12-19 DIAGNOSIS — M810 Age-related osteoporosis without current pathological fracture: Secondary | ICD-10-CM | POA: Diagnosis not present

## 2014-12-19 DIAGNOSIS — Z471 Aftercare following joint replacement surgery: Secondary | ICD-10-CM | POA: Diagnosis not present

## 2014-12-23 DIAGNOSIS — E559 Vitamin D deficiency, unspecified: Secondary | ICD-10-CM | POA: Diagnosis not present

## 2014-12-23 DIAGNOSIS — R413 Other amnesia: Secondary | ICD-10-CM | POA: Diagnosis not present

## 2014-12-23 DIAGNOSIS — Z471 Aftercare following joint replacement surgery: Secondary | ICD-10-CM | POA: Diagnosis not present

## 2014-12-23 DIAGNOSIS — C50919 Malignant neoplasm of unspecified site of unspecified female breast: Secondary | ICD-10-CM | POA: Diagnosis not present

## 2014-12-23 DIAGNOSIS — M810 Age-related osteoporosis without current pathological fracture: Secondary | ICD-10-CM | POA: Diagnosis not present

## 2014-12-23 DIAGNOSIS — Z7901 Long term (current) use of anticoagulants: Secondary | ICD-10-CM | POA: Diagnosis not present

## 2014-12-24 DIAGNOSIS — M810 Age-related osteoporosis without current pathological fracture: Secondary | ICD-10-CM | POA: Diagnosis not present

## 2014-12-25 DIAGNOSIS — E559 Vitamin D deficiency, unspecified: Secondary | ICD-10-CM | POA: Diagnosis not present

## 2014-12-25 DIAGNOSIS — Z471 Aftercare following joint replacement surgery: Secondary | ICD-10-CM | POA: Diagnosis not present

## 2014-12-25 DIAGNOSIS — R413 Other amnesia: Secondary | ICD-10-CM | POA: Diagnosis not present

## 2014-12-25 DIAGNOSIS — M810 Age-related osteoporosis without current pathological fracture: Secondary | ICD-10-CM | POA: Diagnosis not present

## 2014-12-25 DIAGNOSIS — Z7901 Long term (current) use of anticoagulants: Secondary | ICD-10-CM | POA: Diagnosis not present

## 2014-12-25 DIAGNOSIS — C50919 Malignant neoplasm of unspecified site of unspecified female breast: Secondary | ICD-10-CM | POA: Diagnosis not present

## 2014-12-26 DIAGNOSIS — Z471 Aftercare following joint replacement surgery: Secondary | ICD-10-CM | POA: Diagnosis not present

## 2014-12-26 DIAGNOSIS — Z7901 Long term (current) use of anticoagulants: Secondary | ICD-10-CM | POA: Diagnosis not present

## 2014-12-26 DIAGNOSIS — E559 Vitamin D deficiency, unspecified: Secondary | ICD-10-CM | POA: Diagnosis not present

## 2014-12-26 DIAGNOSIS — R413 Other amnesia: Secondary | ICD-10-CM | POA: Diagnosis not present

## 2014-12-26 DIAGNOSIS — C50919 Malignant neoplasm of unspecified site of unspecified female breast: Secondary | ICD-10-CM | POA: Diagnosis not present

## 2014-12-26 DIAGNOSIS — M810 Age-related osteoporosis without current pathological fracture: Secondary | ICD-10-CM | POA: Diagnosis not present

## 2014-12-27 DIAGNOSIS — Z471 Aftercare following joint replacement surgery: Secondary | ICD-10-CM | POA: Diagnosis not present

## 2014-12-27 DIAGNOSIS — E559 Vitamin D deficiency, unspecified: Secondary | ICD-10-CM | POA: Diagnosis not present

## 2014-12-27 DIAGNOSIS — C50919 Malignant neoplasm of unspecified site of unspecified female breast: Secondary | ICD-10-CM | POA: Diagnosis not present

## 2014-12-27 DIAGNOSIS — R413 Other amnesia: Secondary | ICD-10-CM | POA: Diagnosis not present

## 2014-12-27 DIAGNOSIS — M810 Age-related osteoporosis without current pathological fracture: Secondary | ICD-10-CM | POA: Diagnosis not present

## 2014-12-27 DIAGNOSIS — Z7901 Long term (current) use of anticoagulants: Secondary | ICD-10-CM | POA: Diagnosis not present

## 2014-12-30 DIAGNOSIS — Z7901 Long term (current) use of anticoagulants: Secondary | ICD-10-CM | POA: Diagnosis not present

## 2014-12-30 DIAGNOSIS — R413 Other amnesia: Secondary | ICD-10-CM | POA: Diagnosis not present

## 2014-12-30 DIAGNOSIS — Z471 Aftercare following joint replacement surgery: Secondary | ICD-10-CM | POA: Diagnosis not present

## 2014-12-30 DIAGNOSIS — E559 Vitamin D deficiency, unspecified: Secondary | ICD-10-CM | POA: Diagnosis not present

## 2014-12-30 DIAGNOSIS — M810 Age-related osteoporosis without current pathological fracture: Secondary | ICD-10-CM | POA: Diagnosis not present

## 2014-12-30 DIAGNOSIS — C50919 Malignant neoplasm of unspecified site of unspecified female breast: Secondary | ICD-10-CM | POA: Diagnosis not present

## 2015-01-03 DIAGNOSIS — M810 Age-related osteoporosis without current pathological fracture: Secondary | ICD-10-CM | POA: Diagnosis not present

## 2015-01-03 DIAGNOSIS — R413 Other amnesia: Secondary | ICD-10-CM | POA: Diagnosis not present

## 2015-01-03 DIAGNOSIS — Z7901 Long term (current) use of anticoagulants: Secondary | ICD-10-CM | POA: Diagnosis not present

## 2015-01-03 DIAGNOSIS — Z471 Aftercare following joint replacement surgery: Secondary | ICD-10-CM | POA: Diagnosis not present

## 2015-01-03 DIAGNOSIS — C50919 Malignant neoplasm of unspecified site of unspecified female breast: Secondary | ICD-10-CM | POA: Diagnosis not present

## 2015-01-03 DIAGNOSIS — E559 Vitamin D deficiency, unspecified: Secondary | ICD-10-CM | POA: Diagnosis not present

## 2015-01-05 DIAGNOSIS — Z471 Aftercare following joint replacement surgery: Secondary | ICD-10-CM | POA: Diagnosis not present

## 2015-01-05 DIAGNOSIS — C50919 Malignant neoplasm of unspecified site of unspecified female breast: Secondary | ICD-10-CM | POA: Diagnosis not present

## 2015-01-05 DIAGNOSIS — E559 Vitamin D deficiency, unspecified: Secondary | ICD-10-CM | POA: Diagnosis not present

## 2015-01-05 DIAGNOSIS — Z7901 Long term (current) use of anticoagulants: Secondary | ICD-10-CM | POA: Diagnosis not present

## 2015-01-05 DIAGNOSIS — M810 Age-related osteoporosis without current pathological fracture: Secondary | ICD-10-CM | POA: Diagnosis not present

## 2015-01-05 DIAGNOSIS — R413 Other amnesia: Secondary | ICD-10-CM | POA: Diagnosis not present

## 2015-02-05 ENCOUNTER — Other Ambulatory Visit: Payer: Self-pay | Admitting: Dermatology

## 2015-02-05 DIAGNOSIS — L720 Epidermal cyst: Secondary | ICD-10-CM | POA: Diagnosis not present

## 2015-02-05 DIAGNOSIS — D485 Neoplasm of uncertain behavior of skin: Secondary | ICD-10-CM | POA: Diagnosis not present

## 2015-02-05 DIAGNOSIS — C44519 Basal cell carcinoma of skin of other part of trunk: Secondary | ICD-10-CM | POA: Diagnosis not present

## 2015-02-05 DIAGNOSIS — Z85828 Personal history of other malignant neoplasm of skin: Secondary | ICD-10-CM | POA: Diagnosis not present

## 2015-02-05 DIAGNOSIS — L820 Inflamed seborrheic keratosis: Secondary | ICD-10-CM | POA: Diagnosis not present

## 2015-02-19 ENCOUNTER — Other Ambulatory Visit: Payer: Self-pay | Admitting: Neurology

## 2015-03-05 ENCOUNTER — Ambulatory Visit: Payer: Medicare Other | Admitting: Nurse Practitioner

## 2015-03-12 DIAGNOSIS — Z96652 Presence of left artificial knee joint: Secondary | ICD-10-CM | POA: Diagnosis not present

## 2015-03-18 DIAGNOSIS — H2512 Age-related nuclear cataract, left eye: Secondary | ICD-10-CM | POA: Diagnosis not present

## 2015-03-18 DIAGNOSIS — H1859 Other hereditary corneal dystrophies: Secondary | ICD-10-CM | POA: Diagnosis not present

## 2015-03-18 DIAGNOSIS — Z961 Presence of intraocular lens: Secondary | ICD-10-CM | POA: Diagnosis not present

## 2015-04-17 ENCOUNTER — Encounter: Payer: Self-pay | Admitting: Nurse Practitioner

## 2015-04-17 ENCOUNTER — Ambulatory Visit (INDEPENDENT_AMBULATORY_CARE_PROVIDER_SITE_OTHER): Payer: Medicare Other | Admitting: Nurse Practitioner

## 2015-04-17 VITALS — BP 138/74 | HR 98 | Ht 65.0 in | Wt 144.6 lb

## 2015-04-17 DIAGNOSIS — R269 Unspecified abnormalities of gait and mobility: Secondary | ICD-10-CM

## 2015-04-17 DIAGNOSIS — R2689 Other abnormalities of gait and mobility: Secondary | ICD-10-CM

## 2015-04-17 DIAGNOSIS — F0390 Unspecified dementia without behavioral disturbance: Secondary | ICD-10-CM

## 2015-04-17 MED ORDER — MEMANTINE HCL ER 28 MG PO CP24: 28.0000 mg | ORAL_CAPSULE | Freq: Every day | ORAL | Status: DC

## 2015-04-17 MED ORDER — DONEPEZIL HCL 10 MG PO TABS: 10.0000 mg | ORAL_TABLET | Freq: Every day | ORAL | Status: DC

## 2015-04-17 NOTE — Progress Notes (Signed)
[facility]  PATIENT: Alexis York DOB: [id number]   REASON FOR VISIT: Follow-up for dementia HISTORY FROM: Patient and husband    HISTORY OF PRESENT ILLNESS:Alexis York, 81year-old female returns for follow-up. She was last seen 09/02/14. She has a history of memory loss. She is currently on Aricept tolerating the medication without any side effects. She denies having any problems with getting her days and nights mixed up. She has had no wandering behavior. She gets no regular exercise and complains with muscle pain and difficulty walking due to the deconditioning. She has a history of osteoporosis and arthritis, she says she cannot ambulate further than 25 feet without getting short of breath due to deconditioning. She has frequent urination at night and has to get up several times, and complains with difficulty falling asleep at night sometimes, not often. She is ambulating with a walker, denies that she has had any falls, continues to do some cooking, no longer drives. She can feed and dress herself and bathe herself . She returns today with her husband who thinks her memory is about the same as when last seen. She was tried on Aricept 23mg  but had significant GI distress and went back to 10mg . She is also on Namenda, this was started when she was more argumentative and her memory score has declined. She returns for reevaluation.    REVIEW OF SYSTEMS: Full 14 system review of systems performed and notable only for those listed, all others are neg:  Constitutional: neg  Cardiovascular: neg Ear/Nose/Throat: neg  Skin: neg Eyes: neg Respiratory: neg Gastroitestinal: Frequency of urination Hematology/Lymphatic: neg  Endocrine: neg Musculoskeletal:neg Allergy/Immunology: neg Neurological: Memory loss Psychiatric: neg Sleep : neg   ALLERGIES: Allergies  Allergen Reactions  . Codeine Nausea And Vomiting    HOME MEDICATIONS: Outpatient Prescriptions Prior  to Visit  Medication Sig Dispense Refill  . calcium-vitamin D (OSCAL WITH D) 500-200 MG-UNIT per tablet Take 1 tablet by mouth 2 (two) times daily.     . celecoxib (CELEBREX) 200 MG capsule Take 200 mg by mouth 2 (two) times daily.    Marland Kitchen donepezil (ARICEPT) 10 MG tablet TAKE 1 TABLET DAILY 90 tablet 0  . HYDROcodone-acetaminophen (NORCO) 5-325 MG per tablet Take 1-2 tablets by mouth every 6 (six) hours as needed for severe pain. MAXIMUM TOTAL ACETAMINOPHEN DOSE IS 4000 MG PER DAY 50 tablet 0  . NAMENDA XR 28 MG CP24 TAKE 1 CAPSULE DAILY 90 capsule 1  . rivaroxaban (XARELTO) 10 MG TABS tablet Take 1 tablet (10 mg total) by mouth daily. 21 tablet 0  . rosuvastatin (CRESTOR) 20 MG tablet Take 20 mg by mouth daily.    . Vitamin D, Ergocalciferol, (DRISDOL) 50000 UNITS CAPS Take 50,000 Units by mouth every Sunday.     Marland Kitchen acetaminophen (TYLENOL) 325 MG tablet Take 2 tablets (650 mg total) by mouth every 6 (six) hours as needed for mild pain or moderate pain. (Patient not taking: Reported on 04/17/2015) 60 tablet 1  . baclofen (LIORESAL) 10 MG tablet Take 1 tablet (10 mg total) by mouth 3 (three) times daily. As needed for muscle spasm (Patient not taking: Reported on 04/17/2015) 50 tablet 0  . esomeprazole (NEXIUM) 40 MG capsule Take 40 mg by mouth daily before breakfast.    . sennosides-docusate sodium (SENOKOT-S) 8.6-50 MG tablet Take 2 tablets by mouth daily. (Patient not taking: Reported on 04/17/2015) 30 tablet 1   No facility-administered medications prior to visit.    PAST MEDICAL  HISTORY: Past Medical History  Diagnosis Date  . Cancer 1973    left mastectomy  . Osteoporosis   . Hypercholesteremia   . Arthritis   . Vitamin D deficiency   . Memory impairment     short term memory loss  . GERD (gastroesophageal reflux disease)   . Primary osteoarthritis of left knee 10/27/2014    PAST SURGICAL HISTORY: Past Surgical History  Procedure Laterality Date  . Joint replacement  15 yr/10 yr  ago    bilateral hip replacements  . Appendectomy      teenager  . Abdominal hysterectomy  1975  . Breast surgery  1975    mastectomy  . Mastectomy Left   . Tonsillectomy    . Total knee arthroplasty Left 10/27/2014    dr Mardelle Matte  . Total knee arthroplasty Left 10/27/2014    Procedure: TOTAL KNEE ARTHROPLASTY;  Surgeon: Johnny Bridge, MD;  Location: [facility];  Service: Orthopedics;  Laterality: Left;    FAMILY HISTORY: Family History  Problem Relation Age of Onset  . Dementia Mother   . Cancer - Colon Mother   . Cancer - Ovarian Sister     SOCIAL HISTORY: History   Social History  . Marital Status: Married    Spouse Name: Pilar Plate   . Number of Children: N/A  . Years of Education: N/A   Occupational History  . Retired     Social History Main Topics  . Smoking status: Former Smoker -- 1.00 packs/day for 22 years    Types: Cigarettes    Quit date: 12/20/1971  . Smokeless tobacco: Never Used  . Alcohol Use: Yes     Comment: occasional  . Drug Use: No  . Sexual Activity: Not on file   Other Topics Concern  . Not on file   Social History Narrative   Patient lives at home with husband Pilar Plate.      PHYSICAL EXAM  Filed Vitals:   04/17/15 1025  BP: 138/74  Pulse: 98  Height: 5\' 5"  (1.651 m)  Weight: 144 lb 9.6 oz (65.59 kg)   Body mass index is 24.06 kg/(m^2).  Generalized: Well developed, in no acute distress  Head: normocephalic and atraumatic,. Oropharynx benign  Neck: Supple, no carotid bruits  Musculoskeletal: No deformity   Neurological examination   Mentation: Alert , Mini-Mental Status exam 21 out of 30. AFT 8. Unable to draw a clock.   Follows all commands speech and language fluent.   Cranial nerve II-XII: Pupils were equal round reactive to light extraocular movements were full, visual field were full on confrontational test. Facial sensation and strength were normal. hearing was intact to finger rubbing bilaterally. Uvula tongue midline. head  turning and shoulder shrug were normal and symmetric.Tongue protrusion into cheek strength was normal. Motor: normal bulk and tone, full strength in the BUE, BLE, fine finger movements normal, no pronator drift. No focal weakness Sensory: normal and symmetric to light touch, pinprick, and  Vibration, proprioception  Coordination: finger-nose-finger, heel-to-shin bilaterally, no dysmetria Reflexes: 1+ upper lower and symmetric,  plantar responses were flexor bilaterally. Gait and Station: Rising up from seated position with assistance, wide based stance,  small steppage, ambulated 35 feet with her walker no difficulty with turns, short of breath with exertion   DIAGNOSTIC DATA (LABS, IMAGING, TESTING) - I reviewed patient records, labs, notes, testing and imaging myself where available.  Lab Results  Component Value Date   WBC 9.8 10/29/2014   HGB 9.0* 10/29/2014  HCT 28.1* 10/29/2014   MCV 90.4 10/29/2014   PLT 203 10/29/2014      Component Value Date/Time   NA 136 10/28/2014 0521   K 4.6 10/28/2014 0521   CL 101 10/28/2014 0521   CO2 26 10/28/2014 0521   GLUCOSE 112* 10/28/2014 0521   BUN 15 10/28/2014 0521   CREATININE 0.89 10/28/2014 0521   CALCIUM 8.2* 10/28/2014 0521   PROT 7.1 04/08/2011 1840   ALBUMIN 3.4* 04/08/2011 1840   AST 16 04/08/2011 1840   ALT 10 04/08/2011 1840   ALKPHOS 103 04/08/2011 1840   BILITOT 0.6 04/08/2011 1840   GFRNONAA 55* 10/28/2014 0521   GFRAA 64* 10/28/2014 0521   ASSESSMENT AND PLAN  81 y.o. year old female  has a past medical history of memory loss here to follow-up. She is currently on Namenda and Aricept with stable Mini-Mental status exam.  Continue Aricept at current dose will refill Continue Namenda at current dose will refill Memory score is stable Follow-up in 6 months next visit with Dr. De Nurse, [facility], [facility], [facility] [address], [place], [place] [address] [phone]

## 2015-04-17 NOTE — Patient Instructions (Signed)
Continue Aricept at current dose will refill Continue Namenda at current dose will refill Memory score is stable Follow-up in 6 months next visit with Dr. Brett Fairy

## 2015-04-20 NOTE — Progress Notes (Signed)
I agree with the assessment and plan as directed by NP .The patient is known to me .   Caswell Alvillar, MD  

## 2015-04-23 DIAGNOSIS — H2512 Age-related nuclear cataract, left eye: Secondary | ICD-10-CM | POA: Diagnosis not present

## 2015-04-23 DIAGNOSIS — Z87891 Personal history of nicotine dependence: Secondary | ICD-10-CM | POA: Diagnosis not present

## 2015-04-23 DIAGNOSIS — Z7901 Long term (current) use of anticoagulants: Secondary | ICD-10-CM | POA: Diagnosis not present

## 2015-04-23 DIAGNOSIS — Z79899 Other long term (current) drug therapy: Secondary | ICD-10-CM | POA: Diagnosis not present

## 2015-05-14 DIAGNOSIS — M810 Age-related osteoporosis without current pathological fracture: Secondary | ICD-10-CM | POA: Diagnosis not present

## 2015-05-14 DIAGNOSIS — R413 Other amnesia: Secondary | ICD-10-CM | POA: Diagnosis not present

## 2015-05-14 DIAGNOSIS — Z7982 Long term (current) use of aspirin: Secondary | ICD-10-CM | POA: Diagnosis not present

## 2015-05-14 DIAGNOSIS — K219 Gastro-esophageal reflux disease without esophagitis: Secondary | ICD-10-CM | POA: Diagnosis not present

## 2015-05-14 DIAGNOSIS — Z7902 Long term (current) use of antithrombotics/antiplatelets: Secondary | ICD-10-CM | POA: Diagnosis not present

## 2015-05-14 DIAGNOSIS — Z79899 Other long term (current) drug therapy: Secondary | ICD-10-CM | POA: Diagnosis not present

## 2015-05-14 DIAGNOSIS — Z9012 Acquired absence of left breast and nipple: Secondary | ICD-10-CM | POA: Diagnosis not present

## 2015-05-14 DIAGNOSIS — Z96649 Presence of unspecified artificial hip joint: Secondary | ICD-10-CM | POA: Diagnosis not present

## 2015-05-14 DIAGNOSIS — M159 Polyosteoarthritis, unspecified: Secondary | ICD-10-CM | POA: Diagnosis not present

## 2015-05-14 DIAGNOSIS — H2512 Age-related nuclear cataract, left eye: Secondary | ICD-10-CM | POA: Diagnosis not present

## 2015-05-14 DIAGNOSIS — Z853 Personal history of malignant neoplasm of breast: Secondary | ICD-10-CM | POA: Diagnosis not present

## 2015-05-14 DIAGNOSIS — J381 Polyp of vocal cord and larynx: Secondary | ICD-10-CM | POA: Diagnosis not present

## 2015-05-21 DIAGNOSIS — L820 Inflamed seborrheic keratosis: Secondary | ICD-10-CM | POA: Diagnosis not present

## 2015-05-21 DIAGNOSIS — Z85828 Personal history of other malignant neoplasm of skin: Secondary | ICD-10-CM | POA: Diagnosis not present

## 2015-05-21 DIAGNOSIS — L821 Other seborrheic keratosis: Secondary | ICD-10-CM | POA: Diagnosis not present

## 2015-07-14 DIAGNOSIS — H5021 Vertical strabismus, right eye: Secondary | ICD-10-CM | POA: Diagnosis not present

## 2015-08-13 DIAGNOSIS — Z96652 Presence of left artificial knee joint: Secondary | ICD-10-CM | POA: Diagnosis not present

## 2015-09-09 DIAGNOSIS — H5022 Vertical strabismus, left eye: Secondary | ICD-10-CM | POA: Diagnosis not present

## 2015-09-09 DIAGNOSIS — H50012 Monocular esotropia, left eye: Secondary | ICD-10-CM | POA: Diagnosis not present

## 2015-09-10 DIAGNOSIS — H6123 Impacted cerumen, bilateral: Secondary | ICD-10-CM | POA: Diagnosis not present

## 2015-09-10 DIAGNOSIS — H9193 Unspecified hearing loss, bilateral: Secondary | ICD-10-CM | POA: Diagnosis not present

## 2015-10-08 DIAGNOSIS — E559 Vitamin D deficiency, unspecified: Secondary | ICD-10-CM | POA: Diagnosis not present

## 2015-10-08 DIAGNOSIS — E782 Mixed hyperlipidemia: Secondary | ICD-10-CM | POA: Diagnosis not present

## 2015-10-08 DIAGNOSIS — N390 Urinary tract infection, site not specified: Secondary | ICD-10-CM | POA: Diagnosis not present

## 2015-10-08 DIAGNOSIS — R8299 Other abnormal findings in urine: Secondary | ICD-10-CM | POA: Diagnosis not present

## 2015-10-15 DIAGNOSIS — E784 Other hyperlipidemia: Secondary | ICD-10-CM | POA: Diagnosis not present

## 2015-10-15 DIAGNOSIS — Z1389 Encounter for screening for other disorder: Secondary | ICD-10-CM | POA: Diagnosis not present

## 2015-10-15 DIAGNOSIS — C189 Malignant neoplasm of colon, unspecified: Secondary | ICD-10-CM | POA: Diagnosis not present

## 2015-10-15 DIAGNOSIS — Z0000 Encounter for general adult medical examination without abnormal findings: Secondary | ICD-10-CM | POA: Diagnosis not present

## 2015-10-15 DIAGNOSIS — G309 Alzheimer's disease, unspecified: Secondary | ICD-10-CM | POA: Diagnosis not present

## 2015-10-15 DIAGNOSIS — M545 Low back pain: Secondary | ICD-10-CM | POA: Diagnosis not present

## 2015-10-15 DIAGNOSIS — G4710 Hypersomnia, unspecified: Secondary | ICD-10-CM | POA: Diagnosis not present

## 2015-10-15 DIAGNOSIS — M810 Age-related osteoporosis without current pathological fracture: Secondary | ICD-10-CM | POA: Diagnosis not present

## 2015-10-15 DIAGNOSIS — R2689 Other abnormalities of gait and mobility: Secondary | ICD-10-CM | POA: Diagnosis not present

## 2015-10-22 ENCOUNTER — Encounter: Payer: Self-pay | Admitting: Neurology

## 2015-10-22 ENCOUNTER — Ambulatory Visit (INDEPENDENT_AMBULATORY_CARE_PROVIDER_SITE_OTHER): Payer: Medicare Other | Admitting: Neurology

## 2015-10-22 VITALS — BP 122/60 | HR 78 | Resp 20 | Ht 64.0 in | Wt 150.0 lb

## 2015-10-22 DIAGNOSIS — F0390 Unspecified dementia without behavioral disturbance: Secondary | ICD-10-CM

## 2015-10-22 DIAGNOSIS — F068 Other specified mental disorders due to known physiological condition: Secondary | ICD-10-CM | POA: Diagnosis not present

## 2015-10-22 MED ORDER — DONEPEZIL HCL 10 MG PO TABS: 10.0000 mg | ORAL_TABLET | Freq: Every day | ORAL | Status: DC

## 2015-10-22 MED ORDER — MEMANTINE HCL ER 28 MG PO CP24: 28.0000 mg | ORAL_CAPSULE | Freq: Every day | ORAL | Status: DC

## 2015-10-22 NOTE — Progress Notes (Signed)
[facility]  PATIENT: Alexis York DOB: [id number]   REASON FOR VISIT: Follow-up for dementia HISTORY FROM: Patient and husband  HISTORY OF PRESENT ILLNESS:Alexis York, 81 year-old native [place] ( [place])   returns for follow-up. She was last seen 10-21-14, exactly a year ago.   She has a history of progressive memory loss. She is doing well with a long term memory.  She is for over 4 years on Aricept tolerating the medication without any side effects. She denies having any problems with getting her days and nights mixed up.  She has had no wandering behavior. She gets no regular exercise and complains with muscle pain and difficulty walking due to the deconditioning.  She has a history of osteoporosis and arthritis, she says she cannot ambulate further than 25 feet without getting short of breath due to deconditioning. She has frequent urination at night and has to get up several times, and complains with difficulty falling asleep at night sometimes, not often.  She is ambulating with a walker, denies that she has had any falls, continues to do some cooking, no longer drives. Uses a walker, not longer driving. She still set the table. The kitchen likes to go shopping. She does not need any assistance with bathing or dressing.  She returns today with her husband who thinks her memory is about the same as when last seen.  She was tried on Aricept 23mg  but had significant GI distress and went back to 10mg .  She is also on Namenda, this was started when she was more argumentative and her memory score has declined.    REVIEW OF SYSTEMS: Full 14 system review of systems performed and notable only for those listed, all others are neg:   Short-term memory less for some amnestic events, repetitive conversations but long-term memory appears intact. The patient is independent living with her husband in a private home. She is no longer driving but he  drives.  ALLERGIES: Allergies  Allergen Reactions  . Other Itching and Rash  . Codeine Nausea And Vomiting  . Quinolones Other (See Comments)    Eye irritation, pt unsure  . Benzalkonium Chloride Rash    Pt unsure?    HOME MEDICATIONS: Outpatient Prescriptions Prior to Visit  Medication Sig Dispense Refill  . aspirin 81 MG tablet Take 81 mg by mouth daily.    . calcium-vitamin D (OSCAL WITH D) 500-200 MG-UNIT per tablet Take 1 tablet by mouth 2 (two) times daily.     . celecoxib (CELEBREX) 200 MG capsule Take 200 mg by mouth 2 (two) times daily.    Marland Kitchen donepezil (ARICEPT) 10 MG tablet Take 1 tablet (10 mg total) by mouth daily. 90 tablet 1  . memantine (NAMENDA XR) 28 MG CP24 24 hr capsule Take 1 capsule (28 mg total) by mouth daily. 90 capsule 1  . rosuvastatin (CRESTOR) 20 MG tablet Take 20 mg by mouth daily.    . Vitamin D, Ergocalciferol, (DRISDOL) 50000 UNITS CAPS Take 50,000 Units by mouth every Sunday.     . rivaroxaban (XARELTO) 10 MG TABS tablet Take 1 tablet (10 mg total) by mouth daily. (Patient not taking: Reported on 10/22/2015) 21 tablet 0  . HYDROcodone-acetaminophen (NORCO) 5-325 MG per tablet Take 1-2 tablets by mouth every 6 (six) hours as needed for severe pain. MAXIMUM TOTAL ACETAMINOPHEN DOSE IS 4000 MG PER DAY 50 tablet 0   No facility-administered medications prior to visit.    PAST MEDICAL HISTORY:  Past Medical History  Diagnosis Date  . Cancer [facility]) 1973    left mastectomy  . Osteoporosis   . Hypercholesteremia   . Arthritis   . Vitamin D deficiency   . Memory impairment     short term memory loss  . GERD (gastroesophageal reflux disease)   . Primary osteoarthritis of left knee 10/27/2014    PAST SURGICAL HISTORY: Past Surgical History  Procedure Laterality Date  . Joint replacement  15 yr/10 yr ago    bilateral hip replacements  . Appendectomy      teenager  . Abdominal hysterectomy  1975  . Breast surgery  1975    mastectomy  . Mastectomy  Left   . Tonsillectomy    . Total knee arthroplasty Left 10/27/2014    dr Mardelle Matte  . Total knee arthroplasty Left 10/27/2014    Procedure: TOTAL KNEE ARTHROPLASTY;  Surgeon: Johnny Bridge, MD;  Location: [place];  Service: Orthopedics;  Laterality: Left;    FAMILY HISTORY: Family History  Problem Relation Age of Onset  . Dementia Mother   . Cancer - Colon Mother   . Cancer - Ovarian Sister     SOCIAL HISTORY: Social History   Social History  . Marital Status: Married    Spouse Name: Pilar Plate   . Number of Children: N/A  . Years of Education: N/A   Occupational History  . Retired     Social History Main Topics  . Smoking status: Former Smoker -- 1.00 packs/day for 22 years    Types: Cigarettes    Quit date: 12/20/1971  . Smokeless tobacco: Never Used  . Alcohol Use: Yes     Comment: occasional  . Drug Use: No  . Sexual Activity: Not on file   Other Topics Concern  . Not on file   Social History Narrative   Patient lives at home with husband Pilar Plate.      PHYSICAL EXAM  Filed Vitals:   10/22/15 1515  BP: 122/60  Pulse: 78  Resp: 20  Height: 5\' 4"  (1.626 m)  Weight: 150 lb (68.04 kg)   Body mass index is 25.73 kg/(m^2).  Generalized: Well developed, in no acute distress  Head: normocephalic and atraumatic. Oropharynx benign  Neck: Supple, no carotid bruits  Musculoskeletal: No deformity   Neurological examination   MMSE - Mini Mental State Exam 10/22/2015 09/02/2014  Orientation to time 0 1  Orientation to Place 3 2  Registration 3 3  Attention/ Calculation 3 5  Recall 1 0  Language- name 2 objects 2 2  Language- repeat 0 1  Language- follow 3 step command 2 3  Language- read & follow direction 1 1  Write a sentence 1 1  Copy design 1 1  Total score 17 20     Mentation: Alert , Mini-Mental Status exam  10-21-14, 21 out of 30. AFT 8. Unable to draw a clock.   Follows all commands speech and language fluent.   Cranial nerve II-XII: Pupils were  equal round reactive to light .visual field were full on confrontational test. Facial sensation and strength were normal. hearing was intact to finger rubbing bilaterally. Uvula tongue midline. head turning and shoulder shrug were normal and symmetric. Tongue protrusion into cheek strength was normal. Motor: normal bulk and tone, full strength in the BUE, BLE, fine finger movements normal, no pronator drift. No focal weakness Sensory: normal and symmetric to light touch, pinprick, and  Vibration, proprioception  Coordination: finger-nose-finger,Reflexes: 1+ upper lower and  symmetric,  plantar responses were flexor bilaterally.  Gait and Station: Rising up from seated position with assistance, wide based stance,  small steppage, ambulated 35 feet with her walker-  no difficulty with turns, short of breath with exertion   DIAGNOSTIC DATA (LABS, IMAGING, TESTING) - I reviewed patient records, labs, notes, testing and imaging myself where available.  Lab Results  Component Value Date   WBC 9.8 10/29/2014   HGB 9.0* 10/29/2014   HCT 28.1* 10/29/2014   MCV 90.4 10/29/2014   PLT 203 10/29/2014      Component Value Date/Time   NA 136 10/28/2014 0521   K 4.6 10/28/2014 0521   CL 101 10/28/2014 0521   CO2 26 10/28/2014 0521   GLUCOSE 112* 10/28/2014 0521   BUN 15 10/28/2014 0521   CREATININE 0.89 10/28/2014 0521   CALCIUM 8.2* 10/28/2014 0521   PROT 7.1 04/08/2011 1840   ALBUMIN 3.4* 04/08/2011 1840   AST 16 04/08/2011 1840   ALT 10 04/08/2011 1840   ALKPHOS 103 04/08/2011 1840   BILITOT 0.6 04/08/2011 1840   GFRNONAA 55* 10/28/2014 0521   GFRAA 64* 10/28/2014 0521   ASSESSMENT AND PLAN  25 minute RV with more than 50% of the face to face time dedicated to coordination of dementia care. The patient can remain on her current medication.  The couple lives in a private home, [place]. No longer driving.  They will celebrate the 71st wedding anniversary next week. Both originally from [place] and moved down here 33 years ago.  Alexis York's general physical and that was very good results for her metabolic panel, blood cell count and general physical exam. Dr. Philip Aspen as her primary care physician.  81 y.o. year old female  has a past medical history of memory loss here to follow-up. She is currently on Namenda and Aricept with stable Mini-Mental status exam.  Continue Aricept at current dose will refill Continue Namenda at current dose will refill  May change to Namziric  Memory score MMSE 18/30 . Follow-up in 6 months next visit with  Dennie Bible, [facility],    [facility] [address] [place], [place] [address] [phone]

## 2015-12-16 DIAGNOSIS — H50012 Monocular esotropia, left eye: Secondary | ICD-10-CM | POA: Diagnosis not present

## 2015-12-16 DIAGNOSIS — H5021 Vertical strabismus, right eye: Secondary | ICD-10-CM | POA: Diagnosis not present

## 2016-01-26 DIAGNOSIS — G308 Other Alzheimer's disease: Secondary | ICD-10-CM | POA: Diagnosis not present

## 2016-01-26 DIAGNOSIS — R634 Abnormal weight loss: Secondary | ICD-10-CM | POA: Diagnosis not present

## 2016-01-26 DIAGNOSIS — C189 Malignant neoplasm of colon, unspecified: Secondary | ICD-10-CM | POA: Diagnosis not present

## 2016-01-26 DIAGNOSIS — Z6825 Body mass index (BMI) 25.0-25.9, adult: Secondary | ICD-10-CM | POA: Diagnosis not present

## 2016-01-26 DIAGNOSIS — R110 Nausea: Secondary | ICD-10-CM | POA: Diagnosis not present

## 2016-01-27 DIAGNOSIS — R110 Nausea: Secondary | ICD-10-CM | POA: Diagnosis not present

## 2016-01-27 DIAGNOSIS — R634 Abnormal weight loss: Secondary | ICD-10-CM | POA: Diagnosis not present

## 2016-02-08 ENCOUNTER — Telehealth: Payer: Self-pay | Admitting: Neurology

## 2016-02-08 NOTE — Telephone Encounter (Signed)
Pt has an appt on 02/29/2016.

## 2016-02-08 NOTE — Telephone Encounter (Signed)
Sister Meliton Rattan [phone] called to request appointment for patient, states patient's husband just died and daughter Lelon Frohlich would like to know the state of patient, daughter is medical POA. Advised, will need DPR or healthcare POA on file to be able to release medical information.

## 2016-02-23 DIAGNOSIS — I872 Venous insufficiency (chronic) (peripheral): Secondary | ICD-10-CM | POA: Diagnosis not present

## 2016-02-23 DIAGNOSIS — Z6825 Body mass index (BMI) 25.0-25.9, adult: Secondary | ICD-10-CM | POA: Diagnosis not present

## 2016-02-23 DIAGNOSIS — R634 Abnormal weight loss: Secondary | ICD-10-CM | POA: Diagnosis not present

## 2016-02-23 DIAGNOSIS — R0609 Other forms of dyspnea: Secondary | ICD-10-CM | POA: Diagnosis not present

## 2016-02-29 ENCOUNTER — Ambulatory Visit (INDEPENDENT_AMBULATORY_CARE_PROVIDER_SITE_OTHER): Payer: Medicare Other | Admitting: Neurology

## 2016-02-29 ENCOUNTER — Encounter: Payer: Self-pay | Admitting: Neurology

## 2016-02-29 VITALS — BP 100/68 | HR 86 | Resp 20

## 2016-02-29 DIAGNOSIS — F0280 Dementia in other diseases classified elsewhere without behavioral disturbance: Secondary | ICD-10-CM | POA: Diagnosis not present

## 2016-02-29 DIAGNOSIS — G301 Alzheimer's disease with late onset: Secondary | ICD-10-CM | POA: Diagnosis not present

## 2016-02-29 MED ORDER — DONEPEZIL HCL 10 MG PO TABS: 10.0000 mg | ORAL_TABLET | Freq: Every day | ORAL | Status: AC

## 2016-02-29 NOTE — Addendum Note (Signed)
Addended by: Larey Seat on: 02/29/2016 04:26 PM   Modules accepted: Orders

## 2016-02-29 NOTE — Progress Notes (Addendum)
[facility]  PATIENT: Alexis York DOB: [id number]   REASON FOR VISIT: Follow-up for dementia HISTORY FROM: Patient and daughter   HISTORY OF PRESENT ILLNESS:   Alexis York lost her husband Alexis York exactly one month today. He fell due to a heart attack , but was on the floor for hours before an ambulance was called. A neighbor noted that 2 newspapers were in the front yard and no garbage was out. A poker buddy was calling tor Alexis York  on the phone , but Alexis York wouldn't open the door when a neighbor rang the bell. That set a phone tree in motion and finally her daughter was alarmed and came to the house. . This happened on a January 18, 2023 and on January 19, 2023 he died.  Alexis York has now fallen several times, without any mechanical barrier. Her PCP discontinued the Aricept due to her lack of appetite.   Her daughter has moved in and she is being asked about " where  Is Alexis York /" daily, she seems to be in different epoches of her life. Short term memory is gone. She is not declined in comparison to last visit, but continuously over 3-4 years.   Now living in her marital home of many years she is no longer able to bath unassisted, dress unassisted, she needs supervision for medication intake, and she has shown that she is no longer able to appropriate respond to corners on phone or at the door. Her daughter has also noted her progressive gait instability.  I will initiate a Education officer, museum consultation with a goal to also enroll home health for a safety evaluation, possibly physical therapy at the home for gait stabilization, and it may be beneficial if she gets a new hearing aid.  The patient is not interested in the newest and smallest model  -  she would be happy with earphones which she also would not is easily misplace. That could be a help in communication.       MMSE - Mini Mental State Exam 02/29/2016 10/22/2015 09/02/2014  Orientation to time 2 0 1  Orientation to Place  2 3 2   Registration 3 3 3   Attention/ Calculation 2 3 5   Recall 0 1 0  Language- name 2 objects 2 2 2   Language- repeat 1 0 1  Language- follow 3 step command 3 2 3   Language- read & follow direction 1 1 1   Write a sentence 1 1 1   Copy design 0 1 1  Total score 17 17 20            10-22-2015 Alexis York, 82 year-old native [place] ( [place])  returns for follow-up. She was last seen 10-21-14, exactly a year ago.   She has a history of  Slow progressive memory loss. She is doing well with a long term memory.  She is for over 4 years on Aricept tolerating the medication without any side effects. She denies having any problems with getting her days and nights mixed up.  She has had no wandering behavior. She gets no regular exercise and complains with muscle pain and difficulty walking due to the deconditioning.  She has a history of osteoporosis and arthritis, she says she cannot ambulate further than 25 feet without getting short of breath due to deconditioning. She has frequent urination at night and has to get up several times, and complains with difficulty falling asleep at night sometimes, not often.  She is ambulating with a walker,  denies that she has had any falls, continues to do some cooking, no longer drives. Uses a walker, not longer driving. She still set the table. The kitchen likes to go shopping. She does not need any assistance with bathing or dressing.  She returns today with her husband who thinks her memory is about the same as when last seen.  She was tried on Aricept 53m but had significant GI distress and went back to 125m  She is also on Namenda, this was started when she was more argumentative and her memory score has declined.    REVIEW OF SYSTEMS: Full 14 system review of systems performed and notable only for those listed, all others are neg:   Short-term memory less for some amnestic events, repetitive conversations but long-term memory appears  intact. The patient is widowed since March 2017 , previously  independent living with her husband in a private home.  She is no longer driving but he drove- died of a heart attack, had multiple myeloma.   ALLERGIES: Allergies  Allergen Reactions  . Other Itching and Rash  . Codeine Nausea And Vomiting  . Quinolones Other (See Comments)    Eye irritation, pt unsure  . Benzalkonium Chloride Rash    Pt unsure?    HOME MEDICATIONS: Outpatient Prescriptions Prior to Visit  Medication Sig Dispense Refill  . aspirin 81 MG tablet Take 81 mg by mouth daily.    . calcium-vitamin D (OSCAL WITH D) 500-200 MG-UNIT per tablet Take 1 tablet by mouth 2 (two) times daily.     . celecoxib (CELEBREX) 200 MG capsule Take 200 mg by mouth 2 (two) times daily.    . rosuvastatin (CRESTOR) 20 MG tablet Take 20 mg by mouth daily.    . Vitamin D, Ergocalciferol, (DRISDOL) 50000 UNITS CAPS Take 50,000 Units by mouth every Sunday.     . donepezil (ARICEPT) 10 MG tablet Take 1 tablet (10 mg total) by mouth daily. (Patient not taking: Reported on 02/29/2016) 90 tablet 3  . memantine (NAMENDA XR) 28 MG CP24 24 hr capsule Take 1 capsule (28 mg total) by mouth daily. (Patient not taking: Reported on 02/29/2016) 90 capsule 3  . rivaroxaban (XARELTO) 10 MG TABS tablet Take 1 tablet (10 mg total) by mouth daily. (Patient not taking: Reported on 10/22/2015) 21 tablet 0   No facility-administered medications prior to visit.    PAST MEDICAL HISTORY: Past Medical History  Diagnosis Date  [facility]    [facility]  . Osteoporosis   . Hypercholesteremia   . Arthritis   . Vitamin D deficiency   . Memory impairment     short term memory loss  . GERD (gastroesophageal reflux disease)   . Primary osteoarthritis of left knee 10/27/2014    PAST SURGICAL HISTORY: Past Surgical History  Procedure Laterality Date  . Joint replacement  15 yr/10 yr ago    bilateral hip replacements  . Appendectomy       teenager  . Abdominal hysterectomy  1975  . Breast surgery  1975    mastectomy  . Mastectomy Left   . Tonsillectomy    . Total knee arthroplasty Left 10/27/2014    dr laMardelle Matte. Total knee arthroplasty Left 10/27/2014    Procedure: TOTAL KNEE ARTHROPLASTY;  Surgeon: JoJohnny BridgeMD;  [facility];  Laterality: Left;    FAMILY HISTORY: Family History  Problem Relation Age of Onset  . Dementia Mother   .  Cancer - Colon Mother   . Cancer - Ovarian Sister     SOCIAL HISTORY: Social History   Social History  . Marital Status: Married    Spouse Name: Alexis York   . Number of Children: N/A  . Years of Education: N/A   Occupational History  . Retired     Social History Main Topics  . Smoking status: Former Smoker -- 1.00 packs/day for 22 years    Types: Cigarettes    Quit date: 12/20/1971  . Smokeless tobacco: Never Used  . Alcohol Use: Yes     Comment: occasional  . Drug Use: No  . Sexual Activity: Not on file   Other Topics Concern  . Not on file   Social History Narrative   Patient lives at home with husband Alexis York.    Widowed, husband was 41. She is 89.   PHYSICAL EXAM  Filed Vitals:   02/29/16 1534  BP: 100/68  Pulse: 86  Resp: 20   There is no weight on file to calculate BMI.  Generalized: Well developed, in no acute distress  Head: normocephalic and atraumatic. Oropharynx benign  Neck: Supple, no carotid bruits  Musculoskeletal: No deformity   Neurological examination   MMSE - Mini Mental State Exam 02/29/2016 10/22/2015 09/02/2014  Orientation to time 2 0 1  Orientation to Place 2 3 2   Registration 3 3 3   Attention/ Calculation 2 3 5   Recall 0 1 0  Language- name 2 objects 2 2 2   Language- repeat 1 0 1  Language- follow 3 step command 3 2 3   Language- read & follow direction 1 1 1   Write a sentence 1 1 1   Copy design 0 1 1  Total score 17 17 20      Mentation: Alert , Mini-Mental Status exam  10-21-14, 21 out of  30. AFT 8. 02-29-2016  Was 75 -37.  Unable to draw a clock.   Follows all commands speech and language fluent.   Cranial nerve II-XII: Pupils were equal round reactive to light .visual field were full on confrontational test. Facial sensation and strength were normal. hearing was intact to finger rubbing bilaterally. Uvula tongue midline. head turning and shoulder shrug were normal and symmetric. Tongue protrusion into cheek strength was normal. Motor: normal bulk and tone, full strength in the BUE, BLE, fine finger movements normal, no pronator drift. No focal weakness Sensory: normal and symmetric to light touch, pinprick, and  Vibration, proprioception  Coordination: finger-nose-finger,Reflexes: 1+ upper lower and symmetric,  plantar responses were flexor bilaterally.  Gait and Station: Rising up from seated position with assistance, wide based stance,  small steppage, ambulated 35 feet with her walker- resumed the wheelchair.   no difficulty with turns, short of breath with exertion   DIAGNOSTIC DATA (LABS, IMAGING, TESTING) - I reviewed patient records, labs, notes, testing and imaging myself where available.  Lab Results  Component Value Date   WBC 9.8 10/29/2014   HGB 9.0* 10/29/2014   HCT 28.1* 10/29/2014   MCV 90.4 10/29/2014   PLT 203 10/29/2014      Component Value Date/Time   NA 136 10/28/2014 0521   K 4.6 10/28/2014 0521   CL 101 10/28/2014 0521   CO2 26 10/28/2014 0521   GLUCOSE 112* 10/28/2014 0521   BUN 15 10/28/2014 0521   CREATININE 0.89 10/28/2014 0521   CALCIUM 8.2* 10/28/2014 0521   PROT 7.1 04/08/2011 1840   ALBUMIN 3.4* 04/08/2011 1840   AST 16 04/08/2011 1840  ALT 10 04/08/2011 1840   ALKPHOS 103 04/08/2011 1840   BILITOT 0.6 04/08/2011 1840   GFRNONAA 55* 10/28/2014 0521   GFRAA 64* 10/28/2014 0521   ASSESSMENT AND PLAN  25 minute RV with more than 50% of the face to face time dedicated to coordination of dementia care. The patient can remain on her  current medication. Her daughter has moved in after the sudden death of her husband. She lives in her established  private home, [place].  No longer driving. They celebrated the 41st wedding anniversary 30-12 . Both were originally from [place] and moved down here 33 years ago. Alexis York's general physical : metabolic panel, blood cell count and general physical exam. Dr. Philip Aspen is her primary care physician.  82 y.o. year old female  has a past medical history of slow progressive dementia, here to follow-up after recently widowed. She is currently on Namenda and not longer on Aricept with stable Mini-Mental status exam.  Resume  Aricept at  10 mg  dose will refill Continue Namenda at current dose will refill Memory score MMSE 17/30 . Follow-up in 6 months next visit with NP.    Larey Seat, MD   [facility] [address], [place], [place] [address] [phone]

## 2016-02-29 NOTE — Patient Instructions (Addendum)
Alzheimer Disease Caregiver Guide Alzheimer disease is an illness that affects a person's brain. It causes a person to lose the ability to remember things and make good decisions. As the disease progresses, the person is unable to take care of himself or herself and needs more and more help to do simple tasks. Taking care of someone with Alzheimer disease can be very challenging and overwhelming.  MEMORY LOSS AND CONFUSION Memory loss and confusion is mild in the beginning stages of the disease. Both of these problems become more severe as the disease progresses. Eventually, the person will not recognize places or even close family members and friends.   Stay calm.  Respond with a short explanation. Long explanations can be overwhelming and confusing.  Avoid corrections that sound like scolding.  Try not to take it personally, even if the person forgets your name. BEHAVIOR CHANGES Behavior changes are part of the disease. The person may develop depression, anxiety, anger, hallucinations, or other behavior changes. These changes can come on suddenly and may be in response to pain, infection, changes in the environment (temperature, noise), overstimulation, or feeling lost or scared.   Try not to take behavior changes personally.  Remain calm and patient.  Do not argue or try to convince the person about a specific point. This will only make him or her more agitated.  Know that the behavior changes are part of the disease process and try to work through it. TIPS TO REDUCE FRUSTRATION  Schedule wisely by making appointments and doing daily tasks, like bathing and dressing, when the person is at his or her best.  Take your time. Simple tasks may take a lot longer, so be sure to allow for plenty of time.  Limit choices. Too many choices can be overwhelming and stressful for the person.  Involve the person in what you are doing.  Stick to a routine.  Avoid new or crowded situations, if  possible.  Use simple words, short sentences, and a calm voice. Only give one direction at a time.  Buy clothes and shoes that are easy to put on and take off.  Let people help if they offer. HOME SAFETY Keeping the home safe is very important to reduce the risk of falls and injuries.   Keep floors clear of clutter. Remove rugs, magazine racks, and floor lamps.  Keep hallways well lit.  Put a handrail and nonslip mat in the bathtub or shower.  Put childproof locks on cabinets with dangerous items, such as medicine, alcohol, guns, toxic cleaning items, sharp tools or utensils, matches, or lighters.  Place locks on doors where the person cannot easily see or reach them. This helps ensure that the person cannot wander out of the house and get lost.  Be prepared for emergencies. Keep a list of emergency phone numbers and addresses in a convenient area. PLANS FOR THE FUTURE  Do not put off talking about finances.  Talk about money management. People with Alzheimer disease have trouble managing their money as the disease gets worse.  Get help from professional advisors regarding financial and legal matters.  Do not put off talking about future care.  Choose a power of attorney. This is someone who can make decisions for the person with Alzheimer disease when he or she is no longer able to do so.  Talk about driving and when it is the right time to stop. The person's health care provider can help give advice on this matter.  Talk about  the person's living situation. If he or she lives alone, you need to make sure he or she is safe. Some people need extra help at home, and others need more care at a nursing home or care center. SUPPORT GROUPS Joining a support group can be very helpful for caregivers of people with Alzheimer disease. Some advantages to being part of a support group include:   Getting strategies to manage stress.  Sharing experiences with others.  Receiving  emotional comfort and support.  Learning new caregiving skills as the disease progresses.  Knowing what community resources are available and taking advantage of them. SEEK MEDICAL CARE IF:  The person has a fever.  The person has a sudden change in behavior that does not improve with calming strategies.  The person is unable to manage in his or her current living situation.  The person threatens you or anyone else, including himself or herself.  You are no longer able to care for the person.   This information is not intended to replace advice given to you by your health care provider. Make sure you discuss any questions you have with your health care provider.   Document Released: 07/05/2004 Document Revised: 11/14/2014 Document Reviewed: 11/30/2011 Elsevier Interactive Patient Education 2016 [place] Disease Alzheimer disease is a mental disorder. It causes memory loss and loss of other mental functions, such as learning, thinking, problem solving, communicating, and completing tasks. The mental losses interfere with the ability to perform daily activities at work, at home, or in social situations. Alzheimer disease usually starts in a person's late 48s or early 77s but can start earlier in life (familial form). The mental changes caused by this disease are permanent and worsen over time. As the illness progresses, the ability to do even the simplest things is lost. Survival with Alzheimer disease ranges from several years to as long as 20 years. CAUSES Alzheimer disease is caused by abnormally high levels of a protein (beta-amyloid) in the brain. This protein forms very small deposits within and around the brain's nerve cells. These deposits prevent the nerve cells from working properly. Experts are not certain what causes the beta-amyloid deposits in this disease. RISK FACTORS The following major risk factors have been identified:  Increasing age.  Certain genetic  variations, such as Down syndrome (trisomy 21). SYMPTOMS In the early stages of Alzheimer disease, you are still able to perform daily activities but need greater effort, more time, or memory aids. Early symptoms include:  Mild memory loss of recent events, names, or phone numbers.  Loss of objects.  Minor loss of vocabulary.  Difficulty with complex tasks, such as paying bills or driving in unfamiliar locations. Other mental functions deteriorate as the disease worsens. These changes slowly go from mild to severe. Symptoms at this stage include:  Difficulty remembering. You may not be able to recall personal information such as your address and telephone number. You may become confused about the date, the season of the year, or your location.  Difficulty maintaining attention. You may forget what you wanted to say during conversations and repeat what you have already said.  Difficulty learning new information or tasks. You may not remember what you read or the name of a new friend you met.  Difficulty counting or doing math. You may have difficulty with complex math problems. You may make mistakes in paying bills or managing your checkbook.  Poor reasoning and judgment. You may make poor decisions or not dress right  for the weather.  Difficulty communicating. You may have regular difficulty remembering words, naming objects, expressing yourself clearly, or writing sentences that make sense.  Difficulty performing familiar daily activities. You may get lost driving in familiar locations or need help eating, bathing, dressing, grooming, or using the toilet. You may have difficulty maintaining bladder or bowel control.  Difficulty recognizing familiar faces. You may confuse family members or close friends with one another. You may not recognize a close relative or may mistake strangers for family. Alzheimer disease also may cause changes in personality and behavior. These changes include:    Loss of interest or motivation.  Social withdrawal.  Anxiety.  Difficulty sleeping.  Uncharacteristic anger or combativeness.  A false belief that someone is trying to harm you (paranoia).  Seeing things that are not real (hallucinations).  Agitation. Confusion and disruptive behavior are often worse at night and may be triggered by changes in the environment or acute medical issues. DIAGNOSIS  Alzheimer disease is diagnosed through an assessment by your health care provider. During this assessment, your health care provider will do the following:  Ask you and your family, friends, or caregivers questions about your symptoms, their frequency, their duration and progression, and the effect they are having on your life.  Ask questions about your personal and family medical history and use of alcohol or drugs, including prescription medicine.  Perform a physical exam and order blood tests and brain imaging exams. Your health care provider may refer you to a specialist for detailed evaluation of your mental functions (neuropsychological testing).  Many different brain disorders, medical conditions, and certain substances can cause symptoms that resemble Alzheimer disease symptoms. These must be ruled out before this disease can be diagnosed. If Alzheimer disease is diagnosed, it will be considered either "possible" or "probable" Alzheimer disease. "Possible" Alzheimer disease means that your symptoms are typical of the disease and no other disorder is causing them. "Probable" Alzheimer disease means that you also have a family history of the disease or genetic test results that support the diagnosis. Certain tests, mostly used in research studies, are highly specific for Alzheimer disease.  TREATMENT  There is currently no cure for this disease. The goals of treatment are to:  Slow down the progression of the disease.  Preserve mental function as long as possible.  Manage behavioral  symptoms.  Make life easier for the person with Alzheimer disease and his or her caregivers. The following treatment options are available:  Medicine. Certain medicines may help slow memory loss by changing the level of certain chemicals in the brain. Medicine may also help with behavioral symptoms.  Talk therapy. Talk therapy provides education, support, and memory aids for people with this disease. It is most effective in the early stages of the illness.  Caregiving. Caregivers may be family members, friends, or trained medical professionals. They help the person with Alzheimer disease with daily life activities. Caregiving may take place at home or at a nursing facility.  Family support groups. These provide education, emotional support, and information about community resources to family members who are taking care of the person with this disease.   This information is not intended to replace advice given to you by your health care provider. Make sure you discuss any questions you have with your health care provider.   Document Released: 07/05/2004 Document Revised: 11/14/2014 Document Reviewed: 03/01/2013 Elsevier Interactive Patient Education 2016 Elsevier Inc.  Amyloid Beta Protein Precursor Test WHY AM I HAVING THIS  TEST? This test may be used with patients who have increased dementia and confusion. It can be used to diagnose Alzheimer disease (AD) and different types of dementia. If symptoms of dementia are present in you or in someone you care for, your health care provider will do a thorough examination to try to determine the cause. This may include a variety of cognitive tests to assess memory. Scanning tests of the brain may also be done to look for abnormalities. Symptoms of dementia may include:  Memory loss.  Behavioral changes.  Decreased ability to perform daily life functions. WHAT KIND OF SAMPLE IS TAKEN? Cerebrospinal fluid (CSF) samples are collected using a  lumbar puncture procedure, which is also called a spinal tap procedure. This procedure is usually done in a hospital or clinic.  HOW DO I PREPARE FOR THE TEST? There is no preparation required for this test. WHAT ARE THE REFERENCE RANGES? Reference ranges are considered healthy ranges established after testing a large group of healthy people. Reference ranges may vary among different people, labs, and hospitals. It is your responsibility to obtain your test results. Ask the lab or department performing the test when and how you will get your results. The reference range for beta amyloid proteins is greater than 450 units/L. WHAT DO THE RESULTS MEAN? Decreased levels of beta amyloid proteins may indicate:  Alzheimer disease.  Other forms of dementia. Talk with your health care provider to discuss your results, treatment options, and if necessary, the need for more tests. Talk with your health care provider if you have any questions about your results.   This information is not intended to replace advice given to you by your health care provider. Make sure you discuss any questions you have with your health care provider.   Document Released: 11/26/2004 Document Revised: 11/14/2014 Document Reviewed: 04/16/2014 Elsevier Interactive Patient Education [employer].

## 2016-03-01 NOTE — Addendum Note (Signed)
Addended by: Larey Seat on: 03/01/2016 04:38 PM   Modules accepted: Orders

## 2016-03-01 NOTE — Addendum Note (Signed)
Addended by: Lester Natchez A on: 03/01/2016 07:28 AM   Modules accepted: Orders

## 2016-03-02 DIAGNOSIS — Z96652 Presence of left artificial knee joint: Secondary | ICD-10-CM | POA: Diagnosis not present

## 2016-03-03 ENCOUNTER — Telehealth: Payer: Self-pay | Admitting: Neurology

## 2016-03-03 NOTE — Telephone Encounter (Signed)
E what about bathing and dressing and medication supervision ? Marland Kitchen

## 2016-03-03 NOTE — Telephone Encounter (Signed)
I will forward this to Dr. Brett Fairy and Cyril Mourning so they are aware. Physical Therapy from [facility] is going to see patient Friday 03/04/2016.

## 2016-03-03 NOTE — Telephone Encounter (Signed)
FYI-Carrie with [facility] is calling regarding the patient.  She states the patient is not admitted to home health because no skilled nursing need. A returned call is not needed.

## 2016-03-04 DIAGNOSIS — Z853 Personal history of malignant neoplasm of breast: Secondary | ICD-10-CM | POA: Diagnosis not present

## 2016-03-04 DIAGNOSIS — Z7982 Long term (current) use of aspirin: Secondary | ICD-10-CM | POA: Diagnosis not present

## 2016-03-04 DIAGNOSIS — M1712 Unilateral primary osteoarthritis, left knee: Secondary | ICD-10-CM | POA: Diagnosis not present

## 2016-03-04 DIAGNOSIS — H9193 Unspecified hearing loss, bilateral: Secondary | ICD-10-CM | POA: Diagnosis not present

## 2016-03-04 DIAGNOSIS — Z9181 History of falling: Secondary | ICD-10-CM | POA: Diagnosis not present

## 2016-03-04 DIAGNOSIS — F0390 Unspecified dementia without behavioral disturbance: Secondary | ICD-10-CM | POA: Diagnosis not present

## 2016-03-04 DIAGNOSIS — K219 Gastro-esophageal reflux disease without esophagitis: Secondary | ICD-10-CM | POA: Diagnosis not present

## 2016-03-04 DIAGNOSIS — M810 Age-related osteoporosis without current pathological fracture: Secondary | ICD-10-CM | POA: Diagnosis not present

## 2016-03-04 DIAGNOSIS — R2689 Other abnormalities of gait and mobility: Secondary | ICD-10-CM | POA: Diagnosis not present

## 2016-03-04 DIAGNOSIS — R296 Repeated falls: Secondary | ICD-10-CM | POA: Diagnosis not present

## 2016-03-07 NOTE — Telephone Encounter (Signed)
Liji/[facility] [phone] called requesting VO for PT 1x wk 1 week, 2 x 2wk, 1 x 1wk

## 2016-03-07 NOTE — Telephone Encounter (Signed)
Received a VO from Dr. Brett Fairy to order PT for this pt. I spoke to Liji from [facility] and confirmed the PT order.  Hinton Dyer, can you please follow up with Dian Situ from [facility] and find out if we can get home health for this pt?

## 2016-03-09 DIAGNOSIS — H9193 Unspecified hearing loss, bilateral: Secondary | ICD-10-CM | POA: Diagnosis not present

## 2016-03-09 DIAGNOSIS — R296 Repeated falls: Secondary | ICD-10-CM | POA: Diagnosis not present

## 2016-03-09 DIAGNOSIS — M810 Age-related osteoporosis without current pathological fracture: Secondary | ICD-10-CM | POA: Diagnosis not present

## 2016-03-09 DIAGNOSIS — F0390 Unspecified dementia without behavioral disturbance: Secondary | ICD-10-CM | POA: Diagnosis not present

## 2016-03-09 DIAGNOSIS — M1712 Unilateral primary osteoarthritis, left knee: Secondary | ICD-10-CM | POA: Diagnosis not present

## 2016-03-09 DIAGNOSIS — R2689 Other abnormalities of gait and mobility: Secondary | ICD-10-CM | POA: Diagnosis not present

## 2016-03-10 DIAGNOSIS — R296 Repeated falls: Secondary | ICD-10-CM | POA: Diagnosis not present

## 2016-03-10 DIAGNOSIS — M810 Age-related osteoporosis without current pathological fracture: Secondary | ICD-10-CM | POA: Diagnosis not present

## 2016-03-10 DIAGNOSIS — R2689 Other abnormalities of gait and mobility: Secondary | ICD-10-CM | POA: Diagnosis not present

## 2016-03-10 DIAGNOSIS — M1712 Unilateral primary osteoarthritis, left knee: Secondary | ICD-10-CM | POA: Diagnosis not present

## 2016-03-10 DIAGNOSIS — H9193 Unspecified hearing loss, bilateral: Secondary | ICD-10-CM | POA: Diagnosis not present

## 2016-03-10 DIAGNOSIS — F0390 Unspecified dementia without behavioral disturbance: Secondary | ICD-10-CM | POA: Diagnosis not present

## 2016-03-14 DIAGNOSIS — M810 Age-related osteoporosis without current pathological fracture: Secondary | ICD-10-CM | POA: Diagnosis not present

## 2016-03-14 DIAGNOSIS — H9193 Unspecified hearing loss, bilateral: Secondary | ICD-10-CM | POA: Diagnosis not present

## 2016-03-14 DIAGNOSIS — R296 Repeated falls: Secondary | ICD-10-CM | POA: Diagnosis not present

## 2016-03-14 DIAGNOSIS — F0390 Unspecified dementia without behavioral disturbance: Secondary | ICD-10-CM | POA: Diagnosis not present

## 2016-03-14 DIAGNOSIS — M1712 Unilateral primary osteoarthritis, left knee: Secondary | ICD-10-CM | POA: Diagnosis not present

## 2016-03-14 DIAGNOSIS — R2689 Other abnormalities of gait and mobility: Secondary | ICD-10-CM | POA: Diagnosis not present

## 2016-03-17 DIAGNOSIS — R2689 Other abnormalities of gait and mobility: Secondary | ICD-10-CM | POA: Diagnosis not present

## 2016-03-17 DIAGNOSIS — M810 Age-related osteoporosis without current pathological fracture: Secondary | ICD-10-CM | POA: Diagnosis not present

## 2016-03-17 DIAGNOSIS — H9193 Unspecified hearing loss, bilateral: Secondary | ICD-10-CM | POA: Diagnosis not present

## 2016-03-17 DIAGNOSIS — F0390 Unspecified dementia without behavioral disturbance: Secondary | ICD-10-CM | POA: Diagnosis not present

## 2016-03-17 DIAGNOSIS — M1712 Unilateral primary osteoarthritis, left knee: Secondary | ICD-10-CM | POA: Diagnosis not present

## 2016-03-17 DIAGNOSIS — R296 Repeated falls: Secondary | ICD-10-CM | POA: Diagnosis not present

## 2016-03-21 DIAGNOSIS — R2689 Other abnormalities of gait and mobility: Secondary | ICD-10-CM | POA: Diagnosis not present

## 2016-03-21 DIAGNOSIS — R296 Repeated falls: Secondary | ICD-10-CM | POA: Diagnosis not present

## 2016-03-21 DIAGNOSIS — H9193 Unspecified hearing loss, bilateral: Secondary | ICD-10-CM | POA: Diagnosis not present

## 2016-03-21 DIAGNOSIS — F0390 Unspecified dementia without behavioral disturbance: Secondary | ICD-10-CM | POA: Diagnosis not present

## 2016-03-21 DIAGNOSIS — M1712 Unilateral primary osteoarthritis, left knee: Secondary | ICD-10-CM | POA: Diagnosis not present

## 2016-03-21 DIAGNOSIS — M810 Age-related osteoporosis without current pathological fracture: Secondary | ICD-10-CM | POA: Diagnosis not present

## 2016-03-24 DIAGNOSIS — M810 Age-related osteoporosis without current pathological fracture: Secondary | ICD-10-CM | POA: Diagnosis not present

## 2016-03-24 DIAGNOSIS — R296 Repeated falls: Secondary | ICD-10-CM | POA: Diagnosis not present

## 2016-03-24 DIAGNOSIS — F0390 Unspecified dementia without behavioral disturbance: Secondary | ICD-10-CM | POA: Diagnosis not present

## 2016-03-24 DIAGNOSIS — H9193 Unspecified hearing loss, bilateral: Secondary | ICD-10-CM | POA: Diagnosis not present

## 2016-03-24 DIAGNOSIS — R2689 Other abnormalities of gait and mobility: Secondary | ICD-10-CM | POA: Diagnosis not present

## 2016-03-24 DIAGNOSIS — M1712 Unilateral primary osteoarthritis, left knee: Secondary | ICD-10-CM | POA: Diagnosis not present

## 2016-03-28 ENCOUNTER — Encounter (HOSPITAL_COMMUNITY): Payer: Self-pay | Admitting: Emergency Medicine

## 2016-03-28 ENCOUNTER — Inpatient Hospital Stay (HOSPITAL_COMMUNITY)
Admission: EM | Admit: 2016-03-28 | Discharge: 2016-04-04 | DRG: 418 | Disposition: A | Discharge status: Skilled Nursing Facility | Payer: Medicare Other | Attending: Internal Medicine | Admitting: Internal Medicine

## 2016-03-28 DIAGNOSIS — K219 Gastro-esophageal reflux disease without esophagitis: Secondary | ICD-10-CM | POA: Diagnosis present

## 2016-03-28 DIAGNOSIS — K8000 Calculus of gallbladder with acute cholecystitis without obstruction: Secondary | ICD-10-CM | POA: Diagnosis not present

## 2016-03-28 DIAGNOSIS — I248 Other forms of acute ischemic heart disease: Secondary | ICD-10-CM | POA: Diagnosis present

## 2016-03-28 DIAGNOSIS — E785 Hyperlipidemia, unspecified: Secondary | ICD-10-CM | POA: Diagnosis not present

## 2016-03-28 DIAGNOSIS — K810 Acute cholecystitis: Secondary | ICD-10-CM | POA: Diagnosis present

## 2016-03-28 DIAGNOSIS — Z79899 Other long term (current) drug therapy: Secondary | ICD-10-CM

## 2016-03-28 DIAGNOSIS — K819 Cholecystitis, unspecified: Secondary | ICD-10-CM | POA: Diagnosis not present

## 2016-03-28 DIAGNOSIS — Z87891 Personal history of nicotine dependence: Secondary | ICD-10-CM

## 2016-03-28 DIAGNOSIS — Z96652 Presence of left artificial knee joint: Secondary | ICD-10-CM | POA: Diagnosis present

## 2016-03-28 DIAGNOSIS — E871 Hypo-osmolality and hyponatremia: Secondary | ICD-10-CM | POA: Diagnosis not present

## 2016-03-28 DIAGNOSIS — K5900 Constipation, unspecified: Secondary | ICD-10-CM | POA: Diagnosis present

## 2016-03-28 DIAGNOSIS — R1084 Generalized abdominal pain: Secondary | ICD-10-CM | POA: Diagnosis not present

## 2016-03-28 DIAGNOSIS — Z853 Personal history of malignant neoplasm of breast: Secondary | ICD-10-CM

## 2016-03-28 DIAGNOSIS — R109 Unspecified abdominal pain: Secondary | ICD-10-CM | POA: Diagnosis not present

## 2016-03-28 DIAGNOSIS — Z7982 Long term (current) use of aspirin: Secondary | ICD-10-CM

## 2016-03-28 DIAGNOSIS — Z96643 Presence of artificial hip joint, bilateral: Secondary | ICD-10-CM | POA: Diagnosis present

## 2016-03-28 DIAGNOSIS — F0390 Unspecified dementia without behavioral disturbance: Secondary | ICD-10-CM | POA: Diagnosis present

## 2016-03-28 DIAGNOSIS — R5381 Other malaise: Secondary | ICD-10-CM | POA: Diagnosis present

## 2016-03-28 DIAGNOSIS — M545 Low back pain: Secondary | ICD-10-CM | POA: Diagnosis not present

## 2016-03-28 DIAGNOSIS — E876 Hypokalemia: Secondary | ICD-10-CM | POA: Diagnosis present

## 2016-03-28 DIAGNOSIS — Z66 Do not resuscitate: Secondary | ICD-10-CM | POA: Diagnosis not present

## 2016-03-28 DIAGNOSIS — R778 Other specified abnormalities of plasma proteins: Secondary | ICD-10-CM | POA: Diagnosis present

## 2016-03-28 DIAGNOSIS — R7989 Other specified abnormal findings of blood chemistry: Secondary | ICD-10-CM

## 2016-03-28 LAB — COMPREHENSIVE METABOLIC PANEL
ALT: 17 U/L (ref 14–54)
AST: 22 U/L (ref 15–41)
Albumin: 3 g/dL — ABNORMAL LOW (ref 3.5–5.0)
Alkaline Phosphatase: 75 U/L (ref 38–126)
Anion gap: 8 (ref 5–15)
BILIRUBIN TOTAL: 1.4 mg/dL — AB (ref 0.3–1.2)
BUN: 17 mg/dL (ref 6–20)
CHLORIDE: 102 mmol/L (ref 101–111)
CO2: 23 mmol/L (ref 22–32)
CREATININE: 0.92 mg/dL (ref 0.44–1.00)
Calcium: 9 mg/dL (ref 8.9–10.3)
GFR calc Af Amer: >60 mL/min (ref >60)
GFR, EST NON AFRICAN AMERICAN: 53 mL/min — AB (ref >60)
Glucose, Bld: 112 mg/dL — ABNORMAL HIGH (ref 65–99)
Potassium: 3.7 mmol/L (ref 3.5–5.1)
Sodium: 133 mmol/L — ABNORMAL LOW (ref 135–145)
Total Protein: 6.6 g/dL (ref 6.5–8.1)

## 2016-03-28 LAB — CBC
HCT: 43.1 % (ref 36.0–46.0)
Hemoglobin: 13.9 g/dL (ref 12.0–15.0)
MCH: 29.6 pg (ref 26.0–34.0)
MCHC: 32.3 g/dL (ref 30.0–36.0)
MCV: 91.9 fL (ref 78.0–100.0)
PLATELETS: 243 10*3/uL (ref 150–400)
RBC: 4.69 MIL/uL (ref 3.87–5.11)
RDW: 13.9 % (ref 11.5–15.5)
WBC: 15.4 10*3/uL — AB (ref 4.0–10.5)

## 2016-03-28 LAB — TROPONIN I: TROPONIN I: 0.04 ng/mL — AB (ref <0.031)

## 2016-03-28 LAB — LIPASE, BLOOD: LIPASE: 23 U/L (ref 11–51)

## 2016-03-28 MED ORDER — SODIUM CHLORIDE 0.9 % IV SOLN
Freq: Once | INTRAVENOUS | Status: AC
  Administered 2016-03-29: via INTRAVENOUS

## 2016-03-28 NOTE — Care Management Note (Signed)
Case Management Note  Patient Details  Name: Alexis York MRN: [id number] Date of Birth: 08-29-1922  Subjective/Objective:   Patient presented to [facility] ED with c/o back pain patient reports recurrent falls                 Action/Plan:  CM met with patient and Daughter([facility]) concerning recommendations for [facility] services patient and daughter agreeable with transitional care plan. Offer choice Patient and daughter selected [facility]. CM awaiting orders to fax in to [facility]. Will follow up in the am  Expected Discharge Date:     03/28/2016             Expected Discharge Plan:   Home Health   In-House Referral:     Discharge planning Services     Post Acute Care Choice:    Choice offered to:    patient and daughter DME Arranged:    DME Agency:     HH Arranged:    HHRN, PT/OT/HHA [place]:     [facility]  Status of Service:     Medicare Important Message Given:    Date Medicare IM Given:    Medicare IM give by:    Date Additional Medicare IM Given:    Additional Medicare Important Message give by:     If discussed at [place] of Stay Meetings, dates discussed:    Additional CommentsLaurena Slimmer, RN 03/28/2016, 11:22 PM

## 2016-03-28 NOTE — ED Notes (Signed)
PT ASKING FOR WAIT TIMES AGAIN

## 2016-03-28 NOTE — ED Notes (Signed)
THE PTS FAMILY AND THE PT HAVE BEEN TO THE DESK NUMERUS TIMES ASKING HOW LONG THE WAIT   A NUMBER OF PTS ARE STILL IN FRONT OF THIS PT

## 2016-03-28 NOTE — ED Notes (Signed)
Per pts daughter pt has been complaining of upper back pain and abd pain since Friday. Pt has dementia but is at baseline per daughter.

## 2016-03-29 ENCOUNTER — Emergency Department (HOSPITAL_COMMUNITY): Payer: Medicare Other

## 2016-03-29 ENCOUNTER — Encounter (HOSPITAL_COMMUNITY): Payer: Self-pay | Admitting: Radiology

## 2016-03-29 DIAGNOSIS — E785 Hyperlipidemia, unspecified: Secondary | ICD-10-CM | POA: Diagnosis present

## 2016-03-29 DIAGNOSIS — Z87891 Personal history of nicotine dependence: Secondary | ICD-10-CM | POA: Diagnosis not present

## 2016-03-29 DIAGNOSIS — E871 Hypo-osmolality and hyponatremia: Secondary | ICD-10-CM | POA: Diagnosis present

## 2016-03-29 DIAGNOSIS — E876 Hypokalemia: Secondary | ICD-10-CM | POA: Diagnosis present

## 2016-03-29 DIAGNOSIS — F068 Other specified mental disorders due to known physiological condition: Secondary | ICD-10-CM | POA: Diagnosis not present

## 2016-03-29 DIAGNOSIS — R5381 Other malaise: Secondary | ICD-10-CM | POA: Diagnosis present

## 2016-03-29 DIAGNOSIS — R1011 Right upper quadrant pain: Secondary | ICD-10-CM | POA: Diagnosis not present

## 2016-03-29 DIAGNOSIS — R7989 Other specified abnormal findings of blood chemistry: Secondary | ICD-10-CM

## 2016-03-29 DIAGNOSIS — K810 Acute cholecystitis: Secondary | ICD-10-CM | POA: Diagnosis not present

## 2016-03-29 DIAGNOSIS — Z853 Personal history of malignant neoplasm of breast: Secondary | ICD-10-CM | POA: Diagnosis not present

## 2016-03-29 DIAGNOSIS — M1990 Unspecified osteoarthritis, unspecified site: Secondary | ICD-10-CM | POA: Diagnosis not present

## 2016-03-29 DIAGNOSIS — R278 Other lack of coordination: Secondary | ICD-10-CM | POA: Diagnosis not present

## 2016-03-29 DIAGNOSIS — R778 Other specified abnormalities of plasma proteins: Secondary | ICD-10-CM | POA: Diagnosis present

## 2016-03-29 DIAGNOSIS — Z96643 Presence of artificial hip joint, bilateral: Secondary | ICD-10-CM | POA: Diagnosis present

## 2016-03-29 DIAGNOSIS — F0390 Unspecified dementia without behavioral disturbance: Secondary | ICD-10-CM | POA: Diagnosis not present

## 2016-03-29 DIAGNOSIS — R1310 Dysphagia, unspecified: Secondary | ICD-10-CM | POA: Diagnosis not present

## 2016-03-29 DIAGNOSIS — Z96652 Presence of left artificial knee joint: Secondary | ICD-10-CM | POA: Diagnosis present

## 2016-03-29 DIAGNOSIS — K8000 Calculus of gallbladder with acute cholecystitis without obstruction: Secondary | ICD-10-CM | POA: Diagnosis not present

## 2016-03-29 DIAGNOSIS — M6281 Muscle weakness (generalized): Secondary | ICD-10-CM | POA: Diagnosis not present

## 2016-03-29 DIAGNOSIS — K219 Gastro-esophageal reflux disease without esophagitis: Secondary | ICD-10-CM | POA: Diagnosis present

## 2016-03-29 DIAGNOSIS — Z7982 Long term (current) use of aspirin: Secondary | ICD-10-CM | POA: Diagnosis not present

## 2016-03-29 DIAGNOSIS — R269 Unspecified abnormalities of gait and mobility: Secondary | ICD-10-CM | POA: Diagnosis not present

## 2016-03-29 DIAGNOSIS — M179 Osteoarthritis of knee, unspecified: Secondary | ICD-10-CM | POA: Diagnosis not present

## 2016-03-29 DIAGNOSIS — R109 Unspecified abdominal pain: Secondary | ICD-10-CM | POA: Diagnosis not present

## 2016-03-29 DIAGNOSIS — R2689 Other abnormalities of gait and mobility: Secondary | ICD-10-CM | POA: Diagnosis not present

## 2016-03-29 DIAGNOSIS — K5900 Constipation, unspecified: Secondary | ICD-10-CM | POA: Diagnosis present

## 2016-03-29 DIAGNOSIS — K819 Cholecystitis, unspecified: Secondary | ICD-10-CM | POA: Diagnosis not present

## 2016-03-29 DIAGNOSIS — Z66 Do not resuscitate: Secondary | ICD-10-CM | POA: Diagnosis present

## 2016-03-29 DIAGNOSIS — Z79899 Other long term (current) drug therapy: Secondary | ICD-10-CM | POA: Diagnosis not present

## 2016-03-29 DIAGNOSIS — I248 Other forms of acute ischemic heart disease: Secondary | ICD-10-CM | POA: Diagnosis not present

## 2016-03-29 LAB — URINALYSIS, ROUTINE W REFLEX MICROSCOPIC
Bilirubin Urine: NEGATIVE
Glucose, UA: NEGATIVE mg/dL
Hgb urine dipstick: NEGATIVE
KETONES UR: 15 mg/dL — AB
Leukocytes, UA: NEGATIVE
Nitrite: NEGATIVE
PROTEIN: NEGATIVE mg/dL
Specific Gravity, Urine: 1.025 (ref 1.005–1.030)
pH: 6.5 (ref 5.0–8.0)

## 2016-03-29 LAB — CBC
HEMATOCRIT: 37.6 % (ref 36.0–46.0)
Hemoglobin: 12.1 g/dL (ref 12.0–15.0)
MCH: 30.2 pg (ref 26.0–34.0)
MCHC: 32.2 g/dL (ref 30.0–36.0)
MCV: 93.8 fL (ref 78.0–100.0)
PLATELETS: 200 10*3/uL (ref 150–400)
RBC: 4.01 MIL/uL (ref 3.87–5.11)
RDW: 14 % (ref 11.5–15.5)
WBC: 10.5 10*3/uL (ref 4.0–10.5)

## 2016-03-29 LAB — COMPREHENSIVE METABOLIC PANEL
ALBUMIN: 2.5 g/dL — AB (ref 3.5–5.0)
ALK PHOS: 63 U/L (ref 38–126)
ALT: 13 U/L — ABNORMAL LOW (ref 14–54)
ANION GAP: 8 (ref 5–15)
AST: 15 U/L (ref 15–41)
BILIRUBIN TOTAL: 1.2 mg/dL (ref 0.3–1.2)
BUN: 15 mg/dL (ref 6–20)
CALCIUM: 8.1 mg/dL — AB (ref 8.9–10.3)
CO2: 26 mmol/L (ref 22–32)
Chloride: 101 mmol/L (ref 101–111)
Creatinine, Ser: 0.71 mg/dL (ref 0.44–1.00)
GFR calc non Af Amer: >60 mL/min (ref >60)
GLUCOSE: 85 mg/dL (ref 65–99)
Potassium: 3.4 mmol/L — ABNORMAL LOW (ref 3.5–5.1)
Sodium: 135 mmol/L (ref 135–145)
Total Protein: 5.7 g/dL — ABNORMAL LOW (ref 6.5–8.1)

## 2016-03-29 LAB — OSMOLALITY: OSMOLALITY: 282 mosm/kg (ref 275–295)

## 2016-03-29 LAB — TROPONIN I

## 2016-03-29 MED ORDER — SODIUM CHLORIDE 0.9 % IV SOLN
INTRAVENOUS | Status: DC
  Administered 2016-03-29 – 2016-03-31 (×6): via INTRAVENOUS

## 2016-03-29 MED ORDER — HALOPERIDOL LACTATE 5 MG/ML IJ SOLN
2.0000 mg | Freq: Four times a day (QID) | INTRAMUSCULAR | Status: DC | PRN
  Administered 2016-03-29 – 2016-04-01 (×4): 2 mg via INTRAVENOUS
  Filled 2016-03-29 (×5): qty 1

## 2016-03-29 MED ORDER — ONDANSETRON HCL 4 MG/2ML IJ SOLN
4.0000 mg | Freq: Four times a day (QID) | INTRAMUSCULAR | Status: DC | PRN
  Administered 2016-03-31: 4 mg via INTRAVENOUS

## 2016-03-29 MED ORDER — HYDROMORPHONE HCL 1 MG/ML IJ SOLN
0.5000 mg | INTRAMUSCULAR | Status: DC | PRN
  Administered 2016-03-31 – 2016-04-01 (×3): 0.5 mg via INTRAVENOUS
  Filled 2016-03-29 (×3): qty 1

## 2016-03-29 MED ORDER — DONEPEZIL HCL 10 MG PO TABS
10.0000 mg | ORAL_TABLET | Freq: Every day | ORAL | Status: DC
  Administered 2016-03-29 – 2016-04-03 (×6): 10 mg via ORAL
  Filled 2016-03-29 (×6): qty 1

## 2016-03-29 MED ORDER — LORAZEPAM 2 MG/ML IJ SOLN: 1.0000 mg | Freq: Once | INTRAMUSCULAR | Status: DC

## 2016-03-29 MED ORDER — ASPIRIN 81 MG PO CHEW
81.0000 mg | CHEWABLE_TABLET | Freq: Every day | ORAL | Status: DC
  Administered 2016-03-29 – 2016-04-04 (×7): 81 mg via ORAL
  Filled 2016-03-29 (×7): qty 1

## 2016-03-29 MED ORDER — IOPAMIDOL (ISOVUE-300) INJECTION 61%
INTRAVENOUS | Status: AC
  Administered 2016-03-29: 100 mL
  Filled 2016-03-29: qty 100

## 2016-03-29 MED ORDER — DEXTROSE 5 % IV SOLN
2.0000 g | INTRAVENOUS | Status: DC
  Administered 2016-03-29 – 2016-03-31 (×3): 2 g via INTRAVENOUS
  Filled 2016-03-29 (×4): qty 2

## 2016-03-29 MED ORDER — MEMANTINE HCL ER 28 MG PO CP24
28.0000 mg | ORAL_CAPSULE | Freq: Every day | ORAL | Status: DC
  Administered 2016-03-29 – 2016-04-04 (×7): 28 mg via ORAL
  Filled 2016-03-29 (×8): qty 1

## 2016-03-29 MED ORDER — CELECOXIB 200 MG PO CAPS
200.0000 mg | ORAL_CAPSULE | Freq: Two times a day (BID) | ORAL | Status: DC
  Administered 2016-03-29 – 2016-04-04 (×13): 200 mg via ORAL
  Filled 2016-03-29 (×14): qty 1

## 2016-03-29 MED ORDER — PANTOPRAZOLE SODIUM 40 MG PO TBEC
40.0000 mg | DELAYED_RELEASE_TABLET | Freq: Every day | ORAL | Status: DC
Start: 2016-03-29 — End: 2016-04-04
  Administered 2016-03-29 – 2016-04-04 (×7): 40 mg via ORAL
  Filled 2016-03-29 (×7): qty 1

## 2016-03-29 MED ORDER — CEFTRIAXONE SODIUM 1 G IJ SOLR
1.0000 g | Freq: Once | INTRAMUSCULAR | Status: AC
  Administered 2016-03-29: 1 g via INTRAVENOUS
  Filled 2016-03-29: qty 10

## 2016-03-29 MED ORDER — DIVALPROEX SODIUM 125 MG PO CSDR
125.0000 mg | DELAYED_RELEASE_CAPSULE | Freq: Two times a day (BID) | ORAL | Status: DC
  Administered 2016-03-29 – 2016-04-04 (×13): 125 mg via ORAL
  Filled 2016-03-29 (×14): qty 1

## 2016-03-29 MED ORDER — ROSUVASTATIN CALCIUM 20 MG PO TABS
20.0000 mg | ORAL_TABLET | Freq: Every day | ORAL | Status: DC
  Administered 2016-03-29 – 2016-04-03 (×6): 20 mg via ORAL
  Filled 2016-03-29 (×6): qty 1

## 2016-03-29 MED ORDER — SENNOSIDES-DOCUSATE SODIUM 8.6-50 MG PO TABS
1.0000 | ORAL_TABLET | Freq: Every evening | ORAL | Status: DC | PRN
  Administered 2016-04-01: 1 via ORAL
  Filled 2016-03-29: qty 1

## 2016-03-29 MED ORDER — ONDANSETRON HCL 4 MG PO TABS
4.0000 mg | ORAL_TABLET | Freq: Four times a day (QID) | ORAL | Status: DC | PRN
  Administered 2016-04-04: 4 mg via ORAL
  Filled 2016-03-29: qty 1

## 2016-03-29 MED ORDER — ENOXAPARIN SODIUM 40 MG/0.4ML ~~LOC~~ SOLN
40.0000 mg | SUBCUTANEOUS | Status: DC
  Administered 2016-03-30 – 2016-04-03 (×4): 40 mg via SUBCUTANEOUS
  Filled 2016-03-29 (×6): qty 0.4

## 2016-03-29 NOTE — ED Provider Notes (Signed)
CSN: [id number]     Arrival date & time 03/28/16  1336 History   First MD Initiated Contact with Patient 03/28/16 2152     Chief Complaint  Patient presents with  . Back Pain  . Abdominal Pain   PT IS HERE WITH ABD PAIN AND CHEST PAIN.  PT IS ALSO HERE WITH INCREASING DIFFICULTY WITH AMBULATION.  THE PT LIVES WITH HER DAUGHTER AND REQUESTS CARE MANAGEMENT TO EVAL FOR HOME HEALTH.  THE PT HAS NO PAIN NOW.  (Consider location/radiation/quality/duration/timing/severity/associated sxs/prior Treatment) Patient is a 82 y.o. female presenting with back pain and abdominal pain. The history is provided by the patient. The history is limited by a language barrier.  Back Pain Location:  Generalized Quality:  Aching Radiates to:  Does not radiate Pain severity:  Mild Pain is:  Same all the time Onset quality:  Gradual Timing:  Constant Chronicity:  Chronic Associated symptoms: abdominal pain   Abdominal Pain   Past Medical History  Diagnosis Date  . Cancer [facility]) 1973    left mastectomy  . Osteoporosis   . Hypercholesteremia   . Arthritis   . Vitamin D deficiency   . Memory impairment     short term memory loss  . GERD (gastroesophageal reflux disease)   . Primary osteoarthritis of left knee 10/27/2014   Past Surgical History  Procedure Laterality Date  . Joint replacement  15 yr/10 yr ago    bilateral hip replacements  . Appendectomy      teenager  . Abdominal hysterectomy  1975  . Breast surgery  1975    mastectomy  . Mastectomy Left   . Tonsillectomy    . Total knee arthroplasty Left 10/27/2014    dr Mardelle Matte  . Total knee arthroplasty Left 10/27/2014    Procedure: TOTAL KNEE ARTHROPLASTY;  Surgeon: Johnny Bridge, MD;  Location: [place];  Service: Orthopedics;  Laterality: Left;   Family History  Problem Relation Age of Onset  . Dementia Mother   . Cancer - Colon Mother   . Cancer - Ovarian Sister    Social History  Substance Use Topics  . Smoking status: Former  Smoker -- 1.00 packs/day for 22 years    Types: Cigarettes    Quit date: 12/20/1971  . Smokeless tobacco: Never Used  . Alcohol Use: Yes     Comment: occasional   OB History    No data available     Review of Systems  Gastrointestinal: Positive for abdominal pain.  Musculoskeletal: Positive for back pain.  All other systems reviewed and are negative.     Allergies  Other; Codeine; Quinolones; and Benzalkonium chloride  Home Medications   Prior to Admission medications   Medication Sig Start Date End Date Taking? Authorizing Provider  aspirin 81 MG tablet Take 81 mg by mouth daily.   Yes Historical Provider, MD  calcium-vitamin D (OSCAL WITH D) 500-200 MG-UNIT per tablet Take 1 tablet by mouth 2 (two) times daily.    Yes Historical Provider, MD  celecoxib (CELEBREX) 200 MG capsule Take 200 mg by mouth 2 (two) times daily.   Yes Historical Provider, MD  divalproex (DEPAKOTE SPRINKLE) 125 MG capsule Take 125 mg by mouth 2 (two) times daily.   Yes Historical Provider, MD  donepezil (ARICEPT) 10 MG tablet Take 1 tablet (10 mg total) by mouth daily. 02/29/16  Yes Carmen Dohmeier, MD  omeprazole (PRILOSEC) 20 MG capsule Take 20 mg by mouth daily.   Yes Historical Provider, MD  rosuvastatin (CRESTOR) 20 MG tablet Take 20 mg by mouth daily.   Yes Historical Provider, MD  Vitamin D, Ergocalciferol, (DRISDOL) 50000 UNITS CAPS Take 50,000 Units by mouth every Sunday.    Yes Historical Provider, MD  memantine (NAMENDA XR) 28 MG CP24 24 hr capsule Take 1 capsule (28 mg total) by mouth daily. Patient not taking: Reported on 02/29/2016 10/22/15   Larey Seat, MD  rivaroxaban (XARELTO) 10 MG TABS tablet Take 1 tablet (10 mg total) by mouth daily. Patient not taking: Reported on 10/22/2015 10/27/14   Marchia Bond, MD   BP 127/74 mmHg  Pulse 80  Temp(Src) 97.8 F (36.6 C) (Oral)  Resp 20  SpO2 97% Physical Exam  Constitutional: She is oriented to person, place, and time. She appears  well-developed and well-nourished.  HENT:  Head: Normocephalic and atraumatic.  Right Ear: External ear normal.  Left Ear: External ear normal.  Nose: Nose normal.  Mouth/Throat: Oropharynx is clear and moist.  Eyes: Conjunctivae and EOM are normal. Pupils are equal, round, and reactive to light.  Neck: Normal range of motion. Neck supple.  Cardiovascular: Normal rate, regular rhythm, normal heart sounds and intact distal pulses.   Pulmonary/Chest: Effort normal and breath sounds normal.  Abdominal: Soft. Bowel sounds are normal. There is tenderness in the suprapubic area.  Musculoskeletal: Normal range of motion.  Neurological: She is alert and oriented to person, place, and time.  Skin: Skin is warm and dry.  Psychiatric: She has a normal mood and affect. Her behavior is normal. Judgment and thought content normal.  Nursing note and vitals reviewed.   ED Course  Procedures (including critical care time) Labs Review Labs Reviewed  COMPREHENSIVE METABOLIC PANEL - Abnormal; Notable for the following:    Sodium 133 (*)    Glucose, Bld 112 (*)    Albumin 3.0 (*)    Total Bilirubin 1.4 (*)    GFR calc non Af Amer 53 (*)    All other components within normal limits  CBC - Abnormal; Notable for the following:    WBC 15.4 (*)    All other components within normal limits  TROPONIN I - Abnormal; Notable for the following:    Troponin I 0.04 (*)    All other components within normal limits  LIPASE, BLOOD  URINALYSIS, ROUTINE W REFLEX MICROSCOPIC (NOT AT [facility])    Imaging Review No results found. I have personally reviewed and evaluated these images and lab results as part of my medical decision-making.   EKG Interpretation   Date/Time:  Tuesday Mar 29 2016 00:02:35 EDT Ventricular Rate:  80 PR Interval:  148 QRS Duration: 73 QT Interval:  385 QTC Calculation: 444 R Axis:   63 Text Interpretation:  Sinus rhythm Anteroseptal infarct, age indeterminate  Confirmed by [facility]  MD, Vonzell Lindblad ([id number]) on 03/29/2016 12:08:51 AM      MDM  CARE MANAGEMENT SPOKE WITH PT AND I WILL [place] PT.   I WILL OBTAIN A CT CHEST/ABD/PELVIS FOR EVAL OF CHEST AND ABD PAIN.  I SUSPECT THEY WILL BE NORMAL, BUT THEY ARE PENDING AT THE TIME OF SHIFT CHANGE.  UNFORTUNATELY, THERE HAS BEEN A SIGNIFICANT DELAY IN OBTAINING URINE AND THAT IS ALSO PENDING.  PT SIGNED OUT TO DR. Loma Sousa HORTON PENDING THE RESULTS OF CT SCANS AND UA. Final diagnoses:  Multifactorial gait disorder       Isla Pence, MD 03/31/16 1236

## 2016-03-29 NOTE — Care Management Note (Signed)
Case Management Note  Patient Details  Name: TIFFINEE EDENS MRN: [id number] Date of Birth: 24-Jan-1922  Subjective/Objective:          CM following for progression and d/c planning.           Action/Plan: 03/29/2016 Noted ED CM worked with family on 03/28/2016 ,  Pt and daughter selected [facility], however pt was admitted to hospital . Will await new orders, [facility] notified of pt admission and will follow. Will update HH needs prior to d/c.   Expected Discharge Date:                  Expected Discharge Plan:  [place]  In-House Referral:  NA  Discharge planning Services  CM Consult  Post Acute Care Choice:  NA Choice offered to:  Patient  DME Arranged:  N/A DME Agency:  NA  HH Arranged:  RN, PT, OT, Nurse's Aide [place] Agency:  [facility]  Status of Service:  In process, will continue to follow  Medicare Important Message Given:    Date Medicare IM Given:    Medicare IM give by:    Date Additional Medicare IM Given:    Additional Medicare Important Message give by:     If discussed at [place] of Stay Meetings, dates discussed:    Additional Comments:  Adron Bene, RN 03/29/2016, 2:50 PM

## 2016-03-29 NOTE — Consult Note (Signed)
Reason for Consult:abdominal  pain Referring Physician: Dr Jearld Adjutant Alexis York is an 82 y.o. female.  HPI: asked to see for 3 day hx of RUQ pain  Nausea.   CT shows gallstones and acute cholecystitis.  Pt states pain location  RUQ constant for 3 days  And sharpe.   Past Medical History  Diagnosis Date  . Cancer [facility]) 1973    left mastectomy  . Osteoporosis   . Hypercholesteremia   . Arthritis   . Vitamin D deficiency   . Memory impairment     short term memory loss  . GERD (gastroesophageal reflux disease)   . Primary osteoarthritis of left knee 10/27/2014    Past Surgical History  Procedure Laterality Date  . Joint replacement  15 yr/10 yr ago    bilateral hip replacements  . Appendectomy      teenager  . Abdominal hysterectomy  1975  . Breast surgery  1975    mastectomy  . Mastectomy Left   . Tonsillectomy    . Total knee arthroplasty Left 10/27/2014    dr Mardelle Matte  . Total knee arthroplasty Left 10/27/2014    Procedure: TOTAL KNEE ARTHROPLASTY;  Surgeon: Johnny Bridge, MD;  Location: [place];  Service: Orthopedics;  Laterality: Left;    Family History  Problem Relation Age of Onset  . Dementia Mother   . Cancer - Colon Mother   . Cancer - Ovarian Sister     Social History:  reports that Alexis York quit smoking about 44 years ago. Alexis York smoking use included Cigarettes. Alexis York has a 22 pack-year smoking history. Alexis York has never used smokeless tobacco. Alexis York reports that Alexis York drinks alcohol. Alexis York reports that Alexis York does not use illicit drugs.  Allergies:  Allergies  Allergen Reactions  . Other Itching and Rash  . Codeine Nausea And Vomiting  . Quinolones Other (See Comments)    Eye irritation, pt unsure  . Benzalkonium Chloride Rash    Pt unsure?    Medications: I have reviewed the patient's current medications.  Results for orders placed or performed during the hospital encounter of 03/28/16 (from the past 48 hour(s))  Lipase, blood     Status: None   Collection Time:  03/28/16  2:46 PM  Result Value Ref Range   Lipase 23 11 - 51 U/L  Comprehensive metabolic panel     Status: Abnormal   Collection Time: 03/28/16  2:46 PM  Result Value Ref Range   Sodium 133 (L) 135 - 145 mmol/L   Potassium 3.7 3.5 - 5.1 mmol/L   Chloride 102 101 - 111 mmol/L   CO2 23 22 - 32 mmol/L   Glucose, Bld 112 (H) 65 - 99 mg/dL   BUN 17 6 - 20 mg/dL   Creatinine, Ser 0.92 0.44 - 1.00 mg/dL   Calcium 9.0 8.9 - 10.3 mg/dL   Total Protein 6.6 6.5 - 8.1 g/dL   Albumin 3.0 (L) 3.5 - 5.0 g/dL   AST 22 15 - 41 U/L   ALT 17 14 - 54 U/L   Alkaline Phosphatase 75 38 - 126 U/L   Total Bilirubin 1.4 (H) 0.3 - 1.2 mg/dL   GFR calc non Af Amer 53 (L) >60 mL/min   GFR calc Af Amer >60 >60 mL/min    Comment: (NOTE) The eGFR has been calculated using the CKD EPI equation. This calculation has not been validated in all clinical situations. eGFR's persistently <60 mL/min signify possible Chronic Kidney Disease.  Anion gap 8 5 - 15  CBC     Status: Abnormal   Collection Time: 03/28/16  2:46 PM  Result Value Ref Range   WBC 15.4 (H) 4.0 - 10.5 K/uL   RBC 4.69 3.87 - 5.11 MIL/uL   Hemoglobin 13.9 12.0 - 15.0 g/dL   HCT 43.1 36.0 - 46.0 %   MCV 91.9 78.0 - 100.0 fL   MCH 29.6 26.0 - 34.0 pg   MCHC 32.3 30.0 - 36.0 g/dL   RDW 13.9 11.5 - 15.5 %   Platelets 243 150 - 400 K/uL  Troponin I     Status: Abnormal   Collection Time: 03/28/16 10:42 PM  Result Value Ref Range   Troponin I 0.04 (H) <0.031 ng/mL    Comment:        PERSISTENTLY INCREASED TROPONIN VALUES IN THE RANGE OF 0.04-0.49 ng/mL CAN BE SEEN IN:       -UNSTABLE ANGINA       -CONGESTIVE HEART FAILURE       -MYOCARDITIS       -CHEST TRAUMA       -ARRYHTHMIAS       -LATE PRESENTING MYOCARDIAL INFARCTION       -COPD   CLINICAL FOLLOW-UP RECOMMENDED.   Urinalysis, Routine w reflex microscopic     Status: Abnormal   Collection Time: 03/29/16  1:21 AM  Result Value Ref Range   Color, Urine YELLOW YELLOW    APPearance HAZY (A) CLEAR   Specific Gravity, Urine 1.025 1.005 - 1.030   pH 6.5 5.0 - 8.0   Glucose, UA NEGATIVE NEGATIVE mg/dL   Hgb urine dipstick NEGATIVE NEGATIVE   Bilirubin Urine NEGATIVE NEGATIVE   Ketones, ur 15 (A) NEGATIVE mg/dL   Protein, ur NEGATIVE NEGATIVE mg/dL   Nitrite NEGATIVE NEGATIVE   Leukocytes, UA NEGATIVE NEGATIVE    Comment: MICROSCOPIC NOT DONE ON URINES WITH NEGATIVE PROTEIN, BLOOD, LEUKOCYTES, NITRITE, OR GLUCOSE <1000 mg/dL.    Ct Chest W Contrast  03/29/2016  CLINICAL DATA:  82 year old female with upper back and abdominal pain for 3 days. EXAM: CT CHEST, ABDOMEN, AND PELVIS WITH CONTRAST TECHNIQUE: Multidetector CT imaging of the chest, abdomen and pelvis was performed following the standard protocol during bolus administration of intravenous contrast. CONTRAST:  1 ISOVUE-300 IOPAMIDOL (ISOVUE-300) INJECTION 61% COMPARISON:  None. FINDINGS: CT CHEST FINDINGS Mediastinum/Lymph Nodes: Tortuous atherosclerotic thoracic aorta without aneurysm or dissection. Dense coronary artery calcifications or stents. Heart size is normal. No pericardial effusion. No adenopathy. Lungs/Pleura: No pulmonary mass, infiltrate, or effusion. Tiny 3 mm subpleural nodule left upper lobe series 3, image 55. Scattered atelectasis in both lower lobes and right middle lobe. Musculoskeletal: Multiple compression deformities in the thoracic spine. T10 and T12 compression deformities with vertebral augmentation. T7 and T8 compression deformities without augmentation. Acuity of the T7 and T8 fracture is uncertain. Exaggerated thoracic kyphosis and multilevel degenerative change. CT ABDOMEN PELVIS FINDINGS Hepatobiliary: Distended gallbladder with gallbladder wall thickening and pericholecystic edema. Calcified gallstone in the gallbladder neck. Common bile duct measures 10 mm, likely normal for age. No focal hepatic lesion. Pancreas: No mass, inflammatory changes, or other significant abnormality.  Spleen: Within normal limits in size and appearance. Adrenals/Urinary Tract: No masses identified. No evidence of hydronephrosis. Multiple bilateral parapelvic cysts in both kidneys. Cortical cyst in the upper left kidney. Stomach/Bowel: No evidence of obstruction, inflammatory process, or abnormal fluid collections. Moderate stool burden. Scattered distal colonic diverticular without diverticulitis. Vascular/Lymphatic: No pathologically enlarged lymph nodes. No evidence  of abdominal aortic aneurysm. Dense atherosclerosis of the abdominal aorta and its branches, no aneurysm. Reproductive: Uterus is surgically absent. No adnexal mass. Streak artifact obscures detailed evaluation. Other: None. Musculoskeletal: Multilevel degenerative change throughout the lumbar spine, no compression deformity or acute abnormality. An bilateral hip arthroplasties. IMPRESSION: 1. CT findings consistent with acute cholecystitis. No biliary dilatation. 2. Multiple compression deformities throughout the thoracic spine, chronic at T10 and T12 with vertebral augmentation. T7 and T8 compression fractures of uncertain acuity. 3. Diffuse atherosclerosis including coronary artery calcifications. 4. Tiny 3 mm subpleural nodule in the left upper lobe, this is likely infectious or inflammatory. No follow-up needed if patient is low-risk. Non-contrast chest CT can be considered in 12 months if patient is high-risk. This recommendation follows the consensus statement: Guidelines for Management of Incidental Pulmonary Nodules Detected on CT Images:From the Fleischner Society 2017; published online before print (10.1148/[id number]). Electronically Signed   By: Jeb Levering M.D.   On: 03/29/2016 02:56   Ct Abdomen Pelvis W Contrast  03/29/2016  CLINICAL DATA:  82 year old female with upper back and abdominal pain for 3 days. EXAM: CT CHEST, ABDOMEN, AND PELVIS WITH CONTRAST TECHNIQUE: Multidetector CT imaging of the chest, abdomen and  pelvis was performed following the standard protocol during bolus administration of intravenous contrast. CONTRAST:  1 ISOVUE-300 IOPAMIDOL (ISOVUE-300) INJECTION 61% COMPARISON:  None. FINDINGS: CT CHEST FINDINGS Mediastinum/Lymph Nodes: Tortuous atherosclerotic thoracic aorta without aneurysm or dissection. Dense coronary artery calcifications or stents. Heart size is normal. No pericardial effusion. No adenopathy. Lungs/Pleura: No pulmonary mass, infiltrate, or effusion. Tiny 3 mm subpleural nodule left upper lobe series 3, image 55. Scattered atelectasis in both lower lobes and right middle lobe. Musculoskeletal: Multiple compression deformities in the thoracic spine. T10 and T12 compression deformities with vertebral augmentation. T7 and T8 compression deformities without augmentation. Acuity of the T7 and T8 fracture is uncertain. Exaggerated thoracic kyphosis and multilevel degenerative change. CT ABDOMEN PELVIS FINDINGS Hepatobiliary: Distended gallbladder with gallbladder wall thickening and pericholecystic edema. Calcified gallstone in the gallbladder neck. Common bile duct measures 10 mm, likely normal for age. No focal hepatic lesion. Pancreas: No mass, inflammatory changes, or other significant abnormality. Spleen: Within normal limits in size and appearance. Adrenals/Urinary Tract: No masses identified. No evidence of hydronephrosis. Multiple bilateral parapelvic cysts in both kidneys. Cortical cyst in the upper left kidney. Stomach/Bowel: No evidence of obstruction, inflammatory process, or abnormal fluid collections. Moderate stool burden. Scattered distal colonic diverticular without diverticulitis. Vascular/Lymphatic: No pathologically enlarged lymph nodes. No evidence of abdominal aortic aneurysm. Dense atherosclerosis of the abdominal aorta and its branches, no aneurysm. Reproductive: Uterus is surgically absent. No adnexal mass. Streak artifact obscures detailed evaluation. Other: None.  Musculoskeletal: Multilevel degenerative change throughout the lumbar spine, no compression deformity or acute abnormality. An bilateral hip arthroplasties. IMPRESSION: 1. CT findings consistent with acute cholecystitis. No biliary dilatation. 2. Multiple compression deformities throughout the thoracic spine, chronic at T10 and T12 with vertebral augmentation. T7 and T8 compression fractures of uncertain acuity. 3. Diffuse atherosclerosis including coronary artery calcifications. 4. Tiny 3 mm subpleural nodule in the left upper lobe, this is likely infectious or inflammatory. No follow-up needed if patient is low-risk. Non-contrast chest CT can be considered in 12 months if patient is high-risk. This recommendation follows the consensus statement: Guidelines for Management of Incidental Pulmonary Nodules Detected on CT Images:From the Fleischner Society 2017; published online before print (10.1148/[id number]). Electronically Signed   By: Jeb Levering M.D.   On: 03/29/2016 02:56  Review of Systems  Constitutional: Positive for malaise/fatigue.  Respiratory: Negative.   Cardiovascular: Negative.   Gastrointestinal: Positive for nausea and abdominal pain.  Musculoskeletal: Positive for joint pain.  Psychiatric/Behavioral: Negative.    Blood pressure 121/49, pulse 66, temperature 98.2 F (36.8 C), temperature source Oral, resp. rate 20, height _0  (1.651 m), weight 68.266 kg (150 lb 8 oz), SpO2 98 %. Physical Exam  Constitutional: Alexis York appears well-developed.  HENT:  Head: Normocephalic.  Eyes: Pupils are equal, round, and reactive to light. No scleral icterus.  Neck: Normal range of motion.  Cardiovascular: Normal rate.   Respiratory: Effort normal.  GI: There is tenderness in the right upper quadrant. There is positive Murphy's sign.  Musculoskeletal: Normal range of motion.  Neurological: Alexis York is alert.  Skin: Skin is warm and dry.    Assessment/Plan: Acute  cholecystitis  Admit to medicine start antibiotics  Dr Ninfa Linden to see  Discussed cholecystectomy with Alexis York but not sure Alexis York will agree  Cholecystostomy tube another option  Elevated bilirubin    Rozalyn Osland A. 03/29/2016, 6:41 AM

## 2016-03-29 NOTE — Plan of Care (Signed)
Problem: Safety: Goal: Ability to remain free from injury will improve Outcome: Progressing Patient very impulsive and frequently attempts to get out of bed. Family have been a great support in reminding her to stay in bed, keeping her calm, etc.  Problem: Health Behavior/Discharge Planning: Goal: Ability to manage health-related needs will improve Outcome: Progressing Patient from home with family. Daughter is Healthcare POA.  Problem: Skin Integrity: Goal: Risk for impaired skin integrity will decrease Outcome: Completed/Met Date Met:  03/29/16 No skin breakdown at this time. Prophylactic foam dressings to buttocks/sacrum and bilateral heels.  Problem: Tissue Perfusion: Goal: Risk factors for ineffective tissue perfusion will decrease Outcome: Not Progressing Patient refused SQ Lovenox today.  Problem: Nutrition: Goal: Adequate nutrition will be maintained Outcome: Not Progressing Patient with poor appetite, likely secondary to acute cholecystitis.   Problem: Bowel/Gastric: Goal: Will not experience complications related to bowel motility Outcome: Progressing Large BM today.

## 2016-03-29 NOTE — H&P (Signed)
History and Physical  Patient Name: Alexis York     [id number]    DOB: May 08, 1922    DOA: 03/28/2016 PCP: Donnajean Lopes, MD   Patient coming from: Home  Chief Complaint: Abdominal pain  HPI: Alexis York is a 82 y.o. female with a past medical history significant for dementia who presents with abdominal pain for several days.  All history collected from the patient's daughter, as the patient is a poor historian. She notes that over the last 4 or 5 days, the patient has had increasing right upper quadrant abdominal pain, decreased appetite, a few episodes of emesis, and increasing weakness.  Today, and these were worse, so the patient got her mother to the ER.  The pain seems constant, dull or aching in character, is not present at the moment.  ED course: -Afebrile, mildly tachycardic, respiratory rate normal, and saturating well on ambient air, BP low-normal -Na 133, K 3.7, Cr 0.9 (baseline), WBC 15.4K, Hgb 13.9, troponin 0.04, lipase normal, urinalysis clear, T bili slightly elevated -CT of the abdomen and pelvis with contrast showed acute cholecystitis -The case was discussed with general surgery, who recommended conservative management, IV antibiotics, fluids, and TRH was asked to evaluate for admission     Review of Systems:  Pt denies any fever, chills, worsening with food.  All other systems negative except as just noted or noted in the history of present illness.    Past Medical History  Diagnosis Date  . Cancer [facility]) 1973    left mastectomy  . Osteoporosis   . Hypercholesteremia   . Arthritis   . Vitamin D deficiency   . Memory impairment     short term memory loss  . GERD (gastroesophageal reflux disease)   . Primary osteoarthritis of left knee 10/27/2014    Past Surgical History  Procedure Laterality Date  . Joint replacement  15 yr/10 yr ago    bilateral hip replacements  . Appendectomy      teenager  . Abdominal hysterectomy  1975  . Breast  surgery  1975    mastectomy  . Mastectomy Left   . Tonsillectomy    . Total knee arthroplasty Left 10/27/2014    dr Mardelle Matte  . Total knee arthroplasty Left 10/27/2014    Procedure: TOTAL KNEE ARTHROPLASTY;  Surgeon: Johnny Bridge, MD;  Location: [place];  Service: Orthopedics;  Laterality: Left;    Social History: Patient lives with her daughter.  The patient walks usually with a walker, requires a wheelchair this week because of this illness. She has severe dementia. Her husband died very suddenly just a few months ago.  She is from the [place], she smoked very remotely.    Allergies  Allergen Reactions  . Other Itching and Rash  . Codeine Nausea And Vomiting  . Quinolones Other (See Comments)    Eye irritation, pt unsure  . Benzalkonium Chloride Rash    Pt unsure?    Family history: family history includes Cancer - Colon in her mother; Cancer - Ovarian in her sister; Dementia in her mother.  Prior to Admission medications   Medication Sig Start Date End Date Taking? Authorizing Provider  aspirin 81 MG tablet Take 81 mg by mouth daily.   Yes Historical Provider, MD  calcium-vitamin D (OSCAL WITH D) 500-200 MG-UNIT per tablet Take 1 tablet by mouth 2 (two) times daily.    Yes Historical Provider, MD  celecoxib (CELEBREX) 200 MG capsule Take 200 mg by mouth 2 (  two) times daily.   Yes Historical Provider, MD  divalproex (DEPAKOTE SPRINKLE) 125 MG capsule Take 125 mg by mouth 2 (two) times daily.   Yes Historical Provider, MD  donepezil (ARICEPT) 10 MG tablet Take 1 tablet (10 mg total) by mouth daily. 02/29/16  Yes Carmen Dohmeier, MD  omeprazole (PRILOSEC) 20 MG capsule Take 20 mg by mouth daily.   Yes Historical Provider, MD  rosuvastatin (CRESTOR) 20 MG tablet Take 20 mg by mouth daily.   Yes Historical Provider, MD  Vitamin D, Ergocalciferol, (DRISDOL) 50000 UNITS CAPS Take 50,000 Units by mouth every Sunday.    Yes Historical Provider, MD  memantine (NAMENDA XR) 28 MG CP24 24 hr  capsule Take 1 capsule (28 mg total) by mouth daily. Patient not taking: Reported on 02/29/2016 10/22/15   Larey Seat, MD       Physical Exam: BP 123/50 mmHg  Pulse 68  Temp(Src) 97.8 F (36.6 C) (Oral)  Resp 21  SpO2 97% General appearance: Well-developed, elderly adult female, alert and in no acute distress.   Eyes: Anicteric, conjunctiva pink, lids and lashes normal.     ENT: Mild nasal deformity, no discharge, or epistaxis.  OP moist without lesions.   Skin: Warm and dry.  No jaundice.  No suspicious rashes or lesions. Cardiac: RRR, nl S1-S2, no murmurs appreciated.  Capillary refill is brisk.  No LE edema.  Radial and DP pulses 2+ and symmetric. Respiratory: Normal respiratory rate and rhythm.  CTAB without rales or wheezes. Abdomen: Abdomen soft without rigidity.  No identifiable TTP, no Murphy's no rebound, no guarding. No ascites, distension.   MSK: No deformities or effusions. Neuro: Dementia is moderate to severe.  Sensorium intact and responding to questions, attention normal.  Speech is fluent.  Moves all extremities equally and with normal coordination.    Psych: Behavior appropriate.  Affect pleasant.  No evidence of aural or visual hallucinations or delusions.       Labs on Admission:  I have personally reviewed following labs and imaging studies: CBC:  Recent Labs Lab 03/28/16 1446  WBC 15.4*  HGB 13.9  HCT 43.1  MCV 91.9  PLT 0000000   Basic Metabolic Panel:  Recent Labs Lab 03/28/16 1446  NA 133*  K 3.7  CL 102  CO2 23  GLUCOSE 112*  BUN 17  CREATININE 0.92  CALCIUM 9.0   GFR: CrCl cannot be calculated (Unknown ideal weight.). Liver Function Tests:  Recent Labs Lab 03/28/16 1446  AST 22  ALT 17  ALKPHOS 75  BILITOT 1.4*  PROT 6.6  ALBUMIN 3.0*    Recent Labs Lab 03/28/16 1446  LIPASE 23   No results for input(s): AMMONIA in the last 168 hours. Coagulation Profile: No results for input(s): INR, PROTIME in the last 168  hours. Cardiac Enzymes:  Recent Labs Lab 03/28/16 2242  TROPONINI 0.04*   BNP (last 3 results) No results for input(s): PROBNP in the last 8760 hours. HbA1C: No results for input(s): HGBA1C in the last 72 hours. CBG: No results for input(s): GLUCAP in the last 168 hours. Lipid Profile: No results for input(s): CHOL, HDL, LDLCALC, TRIG, CHOLHDL, LDLDIRECT in the last 72 hours. Thyroid Function Tests: No results for input(s): TSH, T4TOTAL, FREET4, T3FREE, THYROIDAB in the last 72 hours. Anemia Panel: No results for input(s): VITAMINB12, FOLATE, FERRITIN, TIBC, IRON, RETICCTPCT in the last 72 hours. Sepsis Labs: @LABRCNTIP (procalcitonin:4,lacticidven:4) )No results found for this or any previous visit (from the past 240 hour(s)).  Radiological Exams on Admission: Personally reviewed: Ct Chest W Contrast  03/29/2016  CLINICAL DATA:  82 year old female with upper back and abdominal pain for 3 days. EXAM: CT CHEST, ABDOMEN, AND PELVIS WITH CONTRAST TECHNIQUE: Multidetector CT imaging of the chest, abdomen and pelvis was performed following the standard protocol during bolus administration of intravenous contrast. CONTRAST:  1 ISOVUE-300 IOPAMIDOL (ISOVUE-300) INJECTION 61% COMPARISON:  None. FINDINGS: CT CHEST FINDINGS Mediastinum/Lymph Nodes: Tortuous atherosclerotic thoracic aorta without aneurysm or dissection. Dense coronary artery calcifications or stents. Heart size is normal. No pericardial effusion. No adenopathy. Lungs/Pleura: No pulmonary mass, infiltrate, or effusion. Tiny 3 mm subpleural nodule left upper lobe series 3, image 55. Scattered atelectasis in both lower lobes and right middle lobe. Musculoskeletal: Multiple compression deformities in the thoracic spine. T10 and T12 compression deformities with vertebral augmentation. T7 and T8 compression deformities without augmentation. Acuity of the T7 and T8 fracture is uncertain. Exaggerated thoracic kyphosis and multilevel  degenerative change. CT ABDOMEN PELVIS FINDINGS Hepatobiliary: Distended gallbladder with gallbladder wall thickening and pericholecystic edema. Calcified gallstone in the gallbladder neck. Common bile duct measures 10 mm, likely normal for age. No focal hepatic lesion. Pancreas: No mass, inflammatory changes, or other significant abnormality. Spleen: Within normal limits in size and appearance. Adrenals/Urinary Tract: No masses identified. No evidence of hydronephrosis. Multiple bilateral parapelvic cysts in both kidneys. Cortical cyst in the upper left kidney. Stomach/Bowel: No evidence of obstruction, inflammatory process, or abnormal fluid collections. Moderate stool burden. Scattered distal colonic diverticular without diverticulitis. Vascular/Lymphatic: No pathologically enlarged lymph nodes. No evidence of abdominal aortic aneurysm. Dense atherosclerosis of the abdominal aorta and its branches, no aneurysm. Reproductive: Uterus is surgically absent. No adnexal mass. Streak artifact obscures detailed evaluation. Other: None. Musculoskeletal: Multilevel degenerative change throughout the lumbar spine, no compression deformity or acute abnormality. An bilateral hip arthroplasties. IMPRESSION: 1. CT findings consistent with acute cholecystitis. No biliary dilatation. 2. Multiple compression deformities throughout the thoracic spine, chronic at T10 and T12 with vertebral augmentation. T7 and T8 compression fractures of uncertain acuity. 3. Diffuse atherosclerosis including coronary artery calcifications. 4. Tiny 3 mm subpleural nodule in the left upper lobe, this is likely infectious or inflammatory. No follow-up needed if patient is low-risk. Non-contrast chest CT can be considered in 12 months if patient is high-risk. This recommendation follows the consensus statement: Guidelines for Management of Incidental Pulmonary Nodules Detected on CT Images:From the Fleischner Society 2017; published online before print  (10.1148/radiol.SG:5268862). Electronically Signed   By: Jeb Levering M.D.   On: 03/29/2016 02:56   Ct Abdomen Pelvis W Contrast  03/29/2016  CLINICAL DATA:  82 year old female with upper back and abdominal pain for 3 days. EXAM: CT CHEST, ABDOMEN, AND PELVIS WITH CONTRAST TECHNIQUE: Multidetector CT imaging of the chest, abdomen and pelvis was performed following the standard protocol during bolus administration of intravenous contrast. CONTRAST:  1 ISOVUE-300 IOPAMIDOL (ISOVUE-300) INJECTION 61% COMPARISON:  None. FINDINGS: CT CHEST FINDINGS Mediastinum/Lymph Nodes: Tortuous atherosclerotic thoracic aorta without aneurysm or dissection. Dense coronary artery calcifications or stents. Heart size is normal. No pericardial effusion. No adenopathy. Lungs/Pleura: No pulmonary mass, infiltrate, or effusion. Tiny 3 mm subpleural nodule left upper lobe series 3, image 55. Scattered atelectasis in both lower lobes and right middle lobe. Musculoskeletal: Multiple compression deformities in the thoracic spine. T10 and T12 compression deformities with vertebral augmentation. T7 and T8 compression deformities without augmentation. Acuity of the T7 and T8 fracture is uncertain. Exaggerated thoracic kyphosis and multilevel degenerative change. CT ABDOMEN  PELVIS FINDINGS Hepatobiliary: Distended gallbladder with gallbladder wall thickening and pericholecystic edema. Calcified gallstone in the gallbladder neck. Common bile duct measures 10 mm, likely normal for age. No focal hepatic lesion. Pancreas: No mass, inflammatory changes, or other significant abnormality. Spleen: Within normal limits in size and appearance. Adrenals/Urinary Tract: No masses identified. No evidence of hydronephrosis. Multiple bilateral parapelvic cysts in both kidneys. Cortical cyst in the upper left kidney. Stomach/Bowel: No evidence of obstruction, inflammatory process, or abnormal fluid collections. Moderate stool burden. Scattered distal colonic  diverticular without diverticulitis. Vascular/Lymphatic: No pathologically enlarged lymph nodes. No evidence of abdominal aortic aneurysm. Dense atherosclerosis of the abdominal aorta and its branches, no aneurysm. Reproductive: Uterus is surgically absent. No adnexal mass. Streak artifact obscures detailed evaluation. Other: None. Musculoskeletal: Multilevel degenerative change throughout the lumbar spine, no compression deformity or acute abnormality. An bilateral hip arthroplasties. IMPRESSION: 1. CT findings consistent with acute cholecystitis. No biliary dilatation. 2. Multiple compression deformities throughout the thoracic spine, chronic at T10 and T12 with vertebral augmentation. T7 and T8 compression fractures of uncertain acuity. 3. Diffuse atherosclerosis including coronary artery calcifications. 4. Tiny 3 mm subpleural nodule in the left upper lobe, this is likely infectious or inflammatory. No follow-up needed if patient is low-risk. Non-contrast chest CT can be considered in 12 months if patient is high-risk. This recommendation follows the consensus statement: Guidelines for Management of Incidental Pulmonary Nodules Detected on CT Images:From the Fleischner Society 2017; published online before print (10.1148/radiol.IJ:2314499). Electronically Signed   By: Jeb Levering M.D.   On: 03/29/2016 02:56    EKG: Independently reviewed. Rate 80, QTc 444, normal sinus rhythm with anterior TWI, no ST changes.    Assessment/Plan 1. Acute cholecystitis:  She has abdominal pain in the RUQ, leukocytosis and slightly elevated bilirubin, consistent with findings on CT of cholecystitis.   -Ceftriaxone 1 g daily -MIVF -Diet clears -Hydromorphone for pain (patient reportedly very sensitive to oxycodone and hydrocodone and codeine) -Consult to General Surgery, appreciate cares -Trend CMP   2. Dementia:  -Continue home Namenda and Aricept and Depakote  3. Hyponatremia:  Presumably hypovolemic or  normovolemic based on exam. -Check free water clearance and fluid restrict if negative  4. Elevated troponin:  Minimal elevation, clinical significance doubted. -Repeat to trend  5. Arthritis: -Continue celecoxib  6. Home medications: -Continue PPI, statin, aspirin      DVT prophylaxis: Lovenox  Code Status: DO NOT RESUSCITATE  Family Communication: Daughter at bedside  Disposition Plan: Anticipate IV antibiotics and serial abdominal exams.  It is expected at the time of admission that the patient will require >2 midnights hospital admission for her intraabdominal infection requiring IV antibiotics. Consults called: General Surgery Admission status: Inpatient, med surg   Medical decision making: Patient seen at 4:57 AM on 03/29/2016.  The patient was discussed with Dr. Dina Rich. What exists of the patient's chart was reviewed in depth.  Clinical condition: stable.        Edwin Dada [facility] Pager [phone]

## 2016-03-29 NOTE — ED Provider Notes (Signed)
Patient signed out pending CT scan of the abdomen. CT scan concerning for acute cholecystitis with pericholecystic fluid and gallbladder wall thickening. On reexamination, patient does have epigastric and right upper quadrant tenderness to palpation. No signs of peritonitis. Pain is not necessarily worse with food intake. LFTs are normal. She does have a white count of 15. No fevers. Discussed with Dr. Brantley Stage, general surgery. Will admit to medicine for antibiotics. Surgery to evaluate later in the morning. Rocephin ordered. Patient and the patient's daughter were updated.  Results for orders placed or performed during the hospital encounter of 03/28/16  Lipase, blood  Result Value Ref Range   Lipase 23 11 - 51 U/L  Comprehensive metabolic panel  Result Value Ref Range   Sodium 133 (L) 135 - 145 mmol/L   Potassium 3.7 3.5 - 5.1 mmol/L   Chloride 102 101 - 111 mmol/L   CO2 23 22 - 32 mmol/L   Glucose, Bld 112 (H) 65 - 99 mg/dL   BUN 17 6 - 20 mg/dL   Creatinine, Ser 0.92 0.44 - 1.00 mg/dL   Calcium 9.0 8.9 - 10.3 mg/dL   Total Protein 6.6 6.5 - 8.1 g/dL   Albumin 3.0 (L) 3.5 - 5.0 g/dL   AST 22 15 - 41 U/L   ALT 17 14 - 54 U/L   Alkaline Phosphatase 75 38 - 126 U/L   Total Bilirubin 1.4 (H) 0.3 - 1.2 mg/dL   GFR calc non Af Amer 53 (L) >60 mL/min   GFR calc Af Amer >60 >60 mL/min   Anion gap 8 5 - 15  CBC  Result Value Ref Range   WBC 15.4 (H) 4.0 - 10.5 K/uL   RBC 4.69 3.87 - 5.11 MIL/uL   Hemoglobin 13.9 12.0 - 15.0 g/dL   HCT 43.1 36.0 - 46.0 %   MCV 91.9 78.0 - 100.0 fL   MCH 29.6 26.0 - 34.0 pg   MCHC 32.3 30.0 - 36.0 g/dL   RDW 13.9 11.5 - 15.5 %   Platelets 243 150 - 400 K/uL  Urinalysis, Routine w reflex microscopic  Result Value Ref Range   Color, Urine YELLOW YELLOW   APPearance HAZY (A) CLEAR   Specific Gravity, Urine 1.025 1.005 - 1.030   pH 6.5 5.0 - 8.0   Glucose, UA NEGATIVE NEGATIVE mg/dL   Hgb urine dipstick NEGATIVE NEGATIVE   Bilirubin Urine NEGATIVE  NEGATIVE   Ketones, ur 15 (A) NEGATIVE mg/dL   Protein, ur NEGATIVE NEGATIVE mg/dL   Nitrite NEGATIVE NEGATIVE   Leukocytes, UA NEGATIVE NEGATIVE  Troponin I  Result Value Ref Range   Troponin I 0.04 (H) <0.031 ng/mL   Ct Chest W Contrast  03/29/2016  CLINICAL DATA:  82 year old female with upper back and abdominal pain for 3 days. EXAM: CT CHEST, ABDOMEN, AND PELVIS WITH CONTRAST TECHNIQUE: Multidetector CT imaging of the chest, abdomen and pelvis was performed following the standard protocol during bolus administration of intravenous contrast. CONTRAST:  1 ISOVUE-300 IOPAMIDOL (ISOVUE-300) INJECTION 61% COMPARISON:  None. FINDINGS: CT CHEST FINDINGS Mediastinum/Lymph Nodes: Tortuous atherosclerotic thoracic aorta without aneurysm or dissection. Dense coronary artery calcifications or stents. Heart size is normal. No pericardial effusion. No adenopathy. Lungs/Pleura: No pulmonary mass, infiltrate, or effusion. Tiny 3 mm subpleural nodule left upper lobe series 3, image 55. Scattered atelectasis in both lower lobes and right middle lobe. Musculoskeletal: Multiple compression deformities in the thoracic spine. T10 and T12 compression deformities with vertebral augmentation. T7 and T8 compression deformities  without augmentation. Acuity of the T7 and T8 fracture is uncertain. Exaggerated thoracic kyphosis and multilevel degenerative change. CT ABDOMEN PELVIS FINDINGS Hepatobiliary: Distended gallbladder with gallbladder wall thickening and pericholecystic edema. Calcified gallstone in the gallbladder neck. Common bile duct measures 10 mm, likely normal for age. No focal hepatic lesion. Pancreas: No mass, inflammatory changes, or other significant abnormality. Spleen: Within normal limits in size and appearance. Adrenals/Urinary Tract: No masses identified. No evidence of hydronephrosis. Multiple bilateral parapelvic cysts in both kidneys. Cortical cyst in the upper left kidney. Stomach/Bowel: No evidence of  obstruction, inflammatory process, or abnormal fluid collections. Moderate stool burden. Scattered distal colonic diverticular without diverticulitis. Vascular/Lymphatic: No pathologically enlarged lymph nodes. No evidence of abdominal aortic aneurysm. Dense atherosclerosis of the abdominal aorta and its branches, no aneurysm. Reproductive: Uterus is surgically absent. No adnexal mass. Streak artifact obscures detailed evaluation. Other: None. Musculoskeletal: Multilevel degenerative change throughout the lumbar spine, no compression deformity or acute abnormality. An bilateral hip arthroplasties. IMPRESSION: 1. CT findings consistent with acute cholecystitis. No biliary dilatation. 2. Multiple compression deformities throughout the thoracic spine, chronic at T10 and T12 with vertebral augmentation. T7 and T8 compression fractures of uncertain acuity. 3. Diffuse atherosclerosis including coronary artery calcifications. 4. Tiny 3 mm subpleural nodule in the left upper lobe, this is likely infectious or inflammatory. No follow-up needed if patient is low-risk. Non-contrast chest CT can be considered in 12 months if patient is high-risk. This recommendation follows the consensus statement: Guidelines for Management of Incidental Pulmonary Nodules Detected on CT Images:From the Fleischner Society 2017; published online before print (10.1148/radiol.SG:5268862). Electronically Signed   By: Jeb Levering M.D.   On: 03/29/2016 02:56   Ct Abdomen Pelvis W Contrast  03/29/2016  CLINICAL DATA:  82 year old female with upper back and abdominal pain for 3 days. EXAM: CT CHEST, ABDOMEN, AND PELVIS WITH CONTRAST TECHNIQUE: Multidetector CT imaging of the chest, abdomen and pelvis was performed following the standard protocol during bolus administration of intravenous contrast. CONTRAST:  1 ISOVUE-300 IOPAMIDOL (ISOVUE-300) INJECTION 61% COMPARISON:  None. FINDINGS: CT CHEST FINDINGS Mediastinum/Lymph Nodes: Tortuous  atherosclerotic thoracic aorta without aneurysm or dissection. Dense coronary artery calcifications or stents. Heart size is normal. No pericardial effusion. No adenopathy. Lungs/Pleura: No pulmonary mass, infiltrate, or effusion. Tiny 3 mm subpleural nodule left upper lobe series 3, image 55. Scattered atelectasis in both lower lobes and right middle lobe. Musculoskeletal: Multiple compression deformities in the thoracic spine. T10 and T12 compression deformities with vertebral augmentation. T7 and T8 compression deformities without augmentation. Acuity of the T7 and T8 fracture is uncertain. Exaggerated thoracic kyphosis and multilevel degenerative change. CT ABDOMEN PELVIS FINDINGS Hepatobiliary: Distended gallbladder with gallbladder wall thickening and pericholecystic edema. Calcified gallstone in the gallbladder neck. Common bile duct measures 10 mm, likely normal for age. No focal hepatic lesion. Pancreas: No mass, inflammatory changes, or other significant abnormality. Spleen: Within normal limits in size and appearance. Adrenals/Urinary Tract: No masses identified. No evidence of hydronephrosis. Multiple bilateral parapelvic cysts in both kidneys. Cortical cyst in the upper left kidney. Stomach/Bowel: No evidence of obstruction, inflammatory process, or abnormal fluid collections. Moderate stool burden. Scattered distal colonic diverticular without diverticulitis. Vascular/Lymphatic: No pathologically enlarged lymph nodes. No evidence of abdominal aortic aneurysm. Dense atherosclerosis of the abdominal aorta and its branches, no aneurysm. Reproductive: Uterus is surgically absent. No adnexal mass. Streak artifact obscures detailed evaluation. Other: None. Musculoskeletal: Multilevel degenerative change throughout the lumbar spine, no compression deformity or acute abnormality. An bilateral hip  arthroplasties. IMPRESSION: 1. CT findings consistent with acute cholecystitis. No biliary dilatation. 2. Multiple  compression deformities throughout the thoracic spine, chronic at T10 and T12 with vertebral augmentation. T7 and T8 compression fractures of uncertain acuity. 3. Diffuse atherosclerosis including coronary artery calcifications. 4. Tiny 3 mm subpleural nodule in the left upper lobe, this is likely infectious or inflammatory. No follow-up needed if patient is low-risk. Non-contrast chest CT can be considered in 12 months if patient is high-risk. This recommendation follows the consensus statement: Guidelines for Management of Incidental Pulmonary Nodules Detected on CT Images:From the Fleischner Society 2017; published online before print (10.1148/radiol.SG:5268862). Electronically Signed   By: Jeb Levering M.D.   On: 03/29/2016 02:56      Merryl Hacker, MD 03/29/16 [phone]

## 2016-03-29 NOTE — Progress Notes (Signed)
[address] [place], [place] [address]    Phone: [phone] FAX: [phone]     Subjective: Wbc normalized.  Ate about 25% of clears this AM. Still sore. No n/v. TB down  Objective:  Vital signs:  Filed Vitals:   03/29/16 0345 03/29/16 0400 03/29/16 0545 03/29/16 0758  BP: 142/50 123/50 121/49 110/47  Pulse: 85 68 66 64  Temp:   98.2 F (36.8 C) 98 F (36.7 C)  TempSrc:   Oral Oral  Resp: 19 21 20 18   Height:   5' 5"  (1.651 m)   Weight:   68.266 kg (150 lb 8 oz)   SpO2: 95% 97% 98% 97%    Last BM Date: 03/29/16  Intake/Output   Yesterday:    This shift:  Total I/O In: 240 [P.O.:240] Out: 300 [Urine:300]   Physical Exam: General: Pt alert to self.   Abdomen: Soft.  Nondistended. ttp ruq.  No evidence of peritonitis.  No incarcerated hernias.    Problem List:   Principal Problem:   Cholecystitis Active Problems:   Senile dementia, uncomplicated   Hyponatremia   Elevated troponin   Acute cholecystitis    Results:   Labs: Results for orders placed or performed during the hospital encounter of 03/28/16 (from the past 48 hour(s))  Lipase, blood     Status: None   Collection Time: 03/28/16  2:46 PM  Result Value Ref Range   Lipase 23 11 - 51 U/L  Comprehensive metabolic panel     Status: Abnormal   Collection Time: 03/28/16  2:46 PM  Result Value Ref Range   Sodium 133 (L) 135 - 145 mmol/L   Potassium 3.7 3.5 - 5.1 mmol/L   Chloride 102 101 - 111 mmol/L   CO2 23 22 - 32 mmol/L   Glucose, Bld 112 (H) 65 - 99 mg/dL   BUN 17 6 - 20 mg/dL   Creatinine, Ser 0.92 0.44 - 1.00 mg/dL   Calcium 9.0 8.9 - 10.3 mg/dL   Total Protein 6.6 6.5 - 8.1 g/dL   Albumin 3.0 (L) 3.5 - 5.0 g/dL   AST 22 15 - 41 U/L   ALT 17 14 - 54 U/L   Alkaline Phosphatase 75 38 - 126 U/L   Total Bilirubin 1.4 (H) 0.3 - 1.2 mg/dL   GFR calc non Af Amer 53 (L) >60 mL/min   GFR calc Af Amer >60 >60 mL/min     Comment: (NOTE) The eGFR has been calculated using the CKD EPI equation. This calculation has not been validated in all clinical situations. eGFR's persistently <60 mL/min signify possible Chronic Kidney Disease.    Anion gap 8 5 - 15  CBC     Status: Abnormal   Collection Time: 03/28/16  2:46 PM  Result Value Ref Range   WBC 15.4 (H) 4.0 - 10.5 K/uL   RBC 4.69 3.87 - 5.11 MIL/uL   Hemoglobin 13.9 12.0 - 15.0 g/dL   HCT 43.1 36.0 - 46.0 %   MCV 91.9 78.0 - 100.0 fL   MCH 29.6 26.0 - 34.0 pg   MCHC 32.3 30.0 - 36.0 g/dL   RDW 13.9 11.5 - 15.5 %   Platelets 243 150 - 400 K/uL  Troponin I     Status: Abnormal   Collection Time: 03/28/16 10:42 PM  Result Value Ref Range   Troponin I 0.04 (H) <0.031 ng/mL  Comment:        PERSISTENTLY INCREASED TROPONIN VALUES IN THE RANGE OF 0.04-0.49 ng/mL CAN BE SEEN IN:       -UNSTABLE ANGINA       -CONGESTIVE HEART FAILURE       -MYOCARDITIS       -CHEST TRAUMA       -ARRYHTHMIAS       -LATE PRESENTING MYOCARDIAL INFARCTION       -COPD   CLINICAL FOLLOW-UP RECOMMENDED.   Urinalysis, Routine w reflex microscopic     Status: Abnormal   Collection Time: 03/29/16  1:21 AM  Result Value Ref Range   Color, Urine YELLOW YELLOW   APPearance HAZY (A) CLEAR   Specific Gravity, Urine 1.025 1.005 - 1.030   pH 6.5 5.0 - 8.0   Glucose, UA NEGATIVE NEGATIVE mg/dL   Hgb urine dipstick NEGATIVE NEGATIVE   Bilirubin Urine NEGATIVE NEGATIVE   Ketones, ur 15 (A) NEGATIVE mg/dL   Protein, ur NEGATIVE NEGATIVE mg/dL   Nitrite NEGATIVE NEGATIVE   Leukocytes, UA NEGATIVE NEGATIVE    Comment: MICROSCOPIC NOT DONE ON URINES WITH NEGATIVE PROTEIN, BLOOD, LEUKOCYTES, NITRITE, OR GLUCOSE <1000 mg/dL.  Osmolality     Status: None   Collection Time: 03/29/16  6:37 AM  Result Value Ref Range   Osmolality 282 275 - 295 mOsm/kg  Troponin I     Status: None   Collection Time: 03/29/16  6:37 AM  Result Value Ref Range   Troponin I <0.03 <0.031 ng/mL     Comment:        NO INDICATION OF MYOCARDIAL INJURY.   CBC     Status: None   Collection Time: 03/29/16  6:37 AM  Result Value Ref Range   WBC 10.5 4.0 - 10.5 K/uL   RBC 4.01 3.87 - 5.11 MIL/uL   Hemoglobin 12.1 12.0 - 15.0 g/dL   HCT 37.6 36.0 - 46.0 %   MCV 93.8 78.0 - 100.0 fL   MCH 30.2 26.0 - 34.0 pg   MCHC 32.2 30.0 - 36.0 g/dL   RDW 14.0 11.5 - 15.5 %   Platelets 200 150 - 400 K/uL  Comprehensive metabolic panel     Status: Abnormal   Collection Time: 03/29/16  6:37 AM  Result Value Ref Range   Sodium 135 135 - 145 mmol/L   Potassium 3.4 (L) 3.5 - 5.1 mmol/L   Chloride 101 101 - 111 mmol/L   CO2 26 22 - 32 mmol/L   Glucose, Bld 85 65 - 99 mg/dL   BUN 15 6 - 20 mg/dL   Creatinine, Ser 0.71 0.44 - 1.00 mg/dL   Calcium 8.1 (L) 8.9 - 10.3 mg/dL   Total Protein 5.7 (L) 6.5 - 8.1 g/dL   Albumin 2.5 (L) 3.5 - 5.0 g/dL   AST 15 15 - 41 U/L   ALT 13 (L) 14 - 54 U/L   Alkaline Phosphatase 63 38 - 126 U/L   Total Bilirubin 1.2 0.3 - 1.2 mg/dL   GFR calc non Af Amer >60 >60 mL/min   GFR calc Af Amer >60 >60 mL/min    Comment: (NOTE) The eGFR has been calculated using the CKD EPI equation. This calculation has not been validated in all clinical situations. eGFR's persistently <60 mL/min signify possible Chronic Kidney Disease.    Anion gap 8 5 - 15    Imaging / Studies: Ct Chest W Contrast  03/29/2016  CLINICAL DATA:  82 year old female with upper back and  abdominal pain for 3 days. EXAM: CT CHEST, ABDOMEN, AND PELVIS WITH CONTRAST TECHNIQUE: Multidetector CT imaging of the chest, abdomen and pelvis was performed following the standard protocol during bolus administration of intravenous contrast. CONTRAST:  1 ISOVUE-300 IOPAMIDOL (ISOVUE-300) INJECTION 61% COMPARISON:  None. FINDINGS: CT CHEST FINDINGS Mediastinum/Lymph Nodes: Tortuous atherosclerotic thoracic aorta without aneurysm or dissection. Dense coronary artery calcifications or stents. Heart size is normal. No  pericardial effusion. No adenopathy. Lungs/Pleura: No pulmonary mass, infiltrate, or effusion. Tiny 3 mm subpleural nodule left upper lobe series 3, image 55. Scattered atelectasis in both lower lobes and right middle lobe. Musculoskeletal: Multiple compression deformities in the thoracic spine. T10 and T12 compression deformities with vertebral augmentation. T7 and T8 compression deformities without augmentation. Acuity of the T7 and T8 fracture is uncertain. Exaggerated thoracic kyphosis and multilevel degenerative change. CT ABDOMEN PELVIS FINDINGS Hepatobiliary: Distended gallbladder with gallbladder wall thickening and pericholecystic edema. Calcified gallstone in the gallbladder neck. Common bile duct measures 10 mm, likely normal for age. No focal hepatic lesion. Pancreas: No mass, inflammatory changes, or other significant abnormality. Spleen: Within normal limits in size and appearance. Adrenals/Urinary Tract: No masses identified. No evidence of hydronephrosis. Multiple bilateral parapelvic cysts in both kidneys. Cortical cyst in the upper left kidney. Stomach/Bowel: No evidence of obstruction, inflammatory process, or abnormal fluid collections. Moderate stool burden. Scattered distal colonic diverticular without diverticulitis. Vascular/Lymphatic: No pathologically enlarged lymph nodes. No evidence of abdominal aortic aneurysm. Dense atherosclerosis of the abdominal aorta and its branches, no aneurysm. Reproductive: Uterus is surgically absent. No adnexal mass. Streak artifact obscures detailed evaluation. Other: None. Musculoskeletal: Multilevel degenerative change throughout the lumbar spine, no compression deformity or acute abnormality. An bilateral hip arthroplasties. IMPRESSION: 1. CT findings consistent with acute cholecystitis. No biliary dilatation. 2. Multiple compression deformities throughout the thoracic spine, chronic at T10 and T12 with vertebral augmentation. T7 and T8 compression  fractures of uncertain acuity. 3. Diffuse atherosclerosis including coronary artery calcifications. 4. Tiny 3 mm subpleural nodule in the left upper lobe, this is likely infectious or inflammatory. No follow-up needed if patient is low-risk. Non-contrast chest CT can be considered in 12 months if patient is high-risk. This recommendation follows the consensus statement: Guidelines for Management of Incidental Pulmonary Nodules Detected on CT Images:From the Fleischner Society 2017; published online before print (10.1148/[id number]). Electronically Signed   By: Jeb Levering M.D.   On: 03/29/2016 02:56   Ct Abdomen Pelvis W Contrast  03/29/2016  CLINICAL DATA:  82 year old female with upper back and abdominal pain for 3 days. EXAM: CT CHEST, ABDOMEN, AND PELVIS WITH CONTRAST TECHNIQUE: Multidetector CT imaging of the chest, abdomen and pelvis was performed following the standard protocol during bolus administration of intravenous contrast. CONTRAST:  1 ISOVUE-300 IOPAMIDOL (ISOVUE-300) INJECTION 61% COMPARISON:  None. FINDINGS: CT CHEST FINDINGS Mediastinum/Lymph Nodes: Tortuous atherosclerotic thoracic aorta without aneurysm or dissection. Dense coronary artery calcifications or stents. Heart size is normal. No pericardial effusion. No adenopathy. Lungs/Pleura: No pulmonary mass, infiltrate, or effusion. Tiny 3 mm subpleural nodule left upper lobe series 3, image 55. Scattered atelectasis in both lower lobes and right middle lobe. Musculoskeletal: Multiple compression deformities in the thoracic spine. T10 and T12 compression deformities with vertebral augmentation. T7 and T8 compression deformities without augmentation. Acuity of the T7 and T8 fracture is uncertain. Exaggerated thoracic kyphosis and multilevel degenerative change. CT ABDOMEN PELVIS FINDINGS Hepatobiliary: Distended gallbladder with gallbladder wall thickening and pericholecystic edema. Calcified gallstone in the gallbladder neck.  Common bile duct  measures 10 mm, likely normal for age. No focal hepatic lesion. Pancreas: No mass, inflammatory changes, or other significant abnormality. Spleen: Within normal limits in size and appearance. Adrenals/Urinary Tract: No masses identified. No evidence of hydronephrosis. Multiple bilateral parapelvic cysts in both kidneys. Cortical cyst in the upper left kidney. Stomach/Bowel: No evidence of obstruction, inflammatory process, or abnormal fluid collections. Moderate stool burden. Scattered distal colonic diverticular without diverticulitis. Vascular/Lymphatic: No pathologically enlarged lymph nodes. No evidence of abdominal aortic aneurysm. Dense atherosclerosis of the abdominal aorta and its branches, no aneurysm. Reproductive: Uterus is surgically absent. No adnexal mass. Streak artifact obscures detailed evaluation. Other: None. Musculoskeletal: Multilevel degenerative change throughout the lumbar spine, no compression deformity or acute abnormality. An bilateral hip arthroplasties. IMPRESSION: 1. CT findings consistent with acute cholecystitis. No biliary dilatation. 2. Multiple compression deformities throughout the thoracic spine, chronic at T10 and T12 with vertebral augmentation. T7 and T8 compression fractures of uncertain acuity. 3. Diffuse atherosclerosis including coronary artery calcifications. 4. Tiny 3 mm subpleural nodule in the left upper lobe, this is likely infectious or inflammatory. No follow-up needed if patient is low-risk. Non-contrast chest CT can be considered in 12 months if patient is high-risk. This recommendation follows the consensus statement: Guidelines for Management of Incidental Pulmonary Nodules Detected on CT Images:From the Fleischner Society 2017; published online before print (10.1148/[id number]). Electronically Signed   By: Jeb Levering M.D.   On: 03/29/2016 02:56    Medications / Allergies:  Scheduled Meds: . aspirin  81 mg Oral Daily  .  cefTRIAXone (ROCEPHIN)  IV  2 g Intravenous Q24H  . celecoxib  200 mg Oral BID  . divalproex  125 mg Oral BID  . donepezil  10 mg Oral QHS  . enoxaparin (LOVENOX) injection  40 mg Subcutaneous Q24H  . memantine  28 mg Oral Daily  . pantoprazole  40 mg Oral Daily  . rosuvastatin  20 mg Oral q1800   Continuous Infusions: . sodium chloride 125 mL/hr at 03/29/16 0809   PRN Meds:.haloperidol lactate, HYDROmorphone (DILAUDID) injection, ondansetron **OR** ondansetron (ZOFRAN) IV, senna-docusate  Antibiotics: Anti-infectives    Start     Dose/Rate Route Frequency Ordered Stop   03/29/16 1600  cefTRIAXone (ROCEPHIN) 2 g in dextrose 5 % 50 mL IVPB     2 g 100 mL/hr over 30 Minutes Intravenous Every 24 hours 03/29/16 0624     03/29/16 0415  cefTRIAXone (ROCEPHIN) 1 g in dextrose 5 % 50 mL IVPB     1 g 100 mL/hr over 30 Minutes Intravenous  Once 03/29/16 0403 03/29/16 0514        Assessment/Plan Acute cholecystitis-pt seen early this AM.  Spoke with her daughter who is her POA.  Would like to try antibiotics.  Discussed cholecystostomy tube, but thinks her mother would pull it out.  For now, continue with antibiotics and follow clinically.    Erby Pian, [facility] Surgery Pager [phone]) For consults and floor pages call [phone]([id number]:30P)  03/29/2016 2:05 PM

## 2016-03-29 NOTE — ED Notes (Signed)
Admitting MD at the bedside.  

## 2016-03-29 NOTE — Progress Notes (Signed)
PROGRESS NOTE                                                                                                                                                                                                             Patient Demographics:    Alexis York, is a 82 y.o. female, DOB - [birth year], [id number]  Admit date - 03/28/2016   Admitting Physician Edwin Dada, MD  Outpatient Primary MD for the patient is Donnajean Lopes, MD  LOS - 0  Outpatient Specialists:none  Chief Complaint  Patient presents with  . Back Pain  . Abdominal Pain       Brief Narrative   82 y.o. female with a past medical history significant for dementia who presents with abdominal pain for several days admitted with acute cholecystitis.   Subjective:   Patient agitated. Responded to haldol. Very confused   Assessment  & Plan :    Principal Problem:   Acute Cholecystitis IV fluids. empiric rocephin. Family undecided about cholecystectomy. CCS discussed options of cholecystostomy tube but daughter feels she may pull it out. Continue abx and fluids.  daughter wants to d/w pts PCP about surgery before deciding.  Active Problems: 2. Dementia:  -Continue Namenda,  Aricept and Depakote. Prn haldol for agitation.   4. Elevated troponin:  Minimal elevation, asymptmoatic.   5. Arthritis: -Continue celecoxib  6. Home medications: -Continue PPI, statin, aspirin    Code Status : DNR  Family Communication  : daughter at bedside  Disposition Plan  : home   Barriers For Discharge :  Acute symptoms. Pending decision for surgery vs conservative management  Consults  : CCS  Procedures  : CT abd  DVT Prophylaxis  :  Lovenox -   Lab Results  Component Value Date   PLT 200 03/29/2016    Antibiotics  :    Anti-infectives    Start     Dose/Rate Route Frequency Ordered Stop   03/29/16 1600  cefTRIAXone (ROCEPHIN)  2 g in dextrose 5 % 50 mL IVPB     2 g 100 mL/hr over 30 Minutes Intravenous Every 24 hours 03/29/16 0624     03/29/16 0415  cefTRIAXone (ROCEPHIN) 1 g in dextrose 5 % 50 mL IVPB     1 g 100 mL/hr over 30 Minutes Intravenous  Once 03/29/16 0403 03/29/16  [id number]        Objective:   Filed Vitals:   03/29/16 0345 03/29/16 0400 03/29/16 0545 03/29/16 0758  BP: 142/50 123/50 121/49 110/47  Pulse: 85 68 66 64  Temp:   98.2 F (36.8 C) 98 F (36.7 C)  TempSrc:   Oral Oral  Resp: 19 21 20 18   Height:   5\' 5"  (1.651 m)   Weight:   68.266 kg (150 lb 8 oz)   SpO2: 95% 97% 98% 97%    Wt Readings from Last 3 Encounters:  03/29/16 68.266 kg (150 lb 8 oz)  10/22/15 68.04 kg (150 lb)  04/17/15 65.59 kg (144 lb 9.6 oz)     Intake/Output Summary (Last 24 hours) at 03/29/16 1555 Last data filed at 03/29/16 1450  Gross per 24 hour  Intake    360 ml  Output    300 ml  Net     60 ml     Physical Exam  Gen: not in distress, confused HEENT:  no icterus, moist mucosa, supple neck Chest: clear b/l, no added sounds CVS: N S1&S2, no murmurs, rubs or gallop GI: soft,  ND, BS+, RUQ tenderness Musculoskeletal: warm, no edema CNS: AAOX0    Data Review:    CBC  Recent Labs Lab 03/28/16 1446 03/29/16 0637  WBC 15.4* 10.5  HGB 13.9 12.1  HCT 43.1 37.6  PLT 243 200  MCV 91.9 93.8  MCH 29.6 30.2  MCHC 32.3 32.2  RDW 13.9 14.0    Chemistries   Recent Labs Lab 03/28/16 1446 03/29/16 0637  NA 133* 135  K 3.7 3.4*  CL 102 101  CO2 23 26  GLUCOSE 112* 85  BUN 17 15  CREATININE 0.92 0.71  CALCIUM 9.0 8.1*  AST 22 15  ALT 17 13*  ALKPHOS 75 63  BILITOT 1.4* 1.2   ------------------------------------------------------------------------------------------------------------------ No results for input(s): CHOL, HDL, LDLCALC, TRIG, CHOLHDL, LDLDIRECT in the last 72 hours.  No results found for:  HGBA1C ------------------------------------------------------------------------------------------------------------------ No results for input(s): TSH, T4TOTAL, T3FREE, THYROIDAB in the last 72 hours.  Invalid input(s): FREET3 ------------------------------------------------------------------------------------------------------------------ No results for input(s): VITAMINB12, FOLATE, FERRITIN, TIBC, IRON, RETICCTPCT in the last 72 hours.  Coagulation profile No results for input(s): INR, PROTIME in the last 168 hours.  No results for input(s): DDIMER in the last 72 hours.  Cardiac Enzymes  Recent Labs Lab 03/28/16 2242 03/29/16 0637  TROPONINI 0.04* <0.03   ------------------------------------------------------------------------------------------------------------------ No results found for: BNP  Inpatient Medications  Scheduled Meds: . aspirin  81 mg Oral Daily  . cefTRIAXone (ROCEPHIN)  IV  2 g Intravenous Q24H  . celecoxib  200 mg Oral BID  . divalproex  125 mg Oral BID  . donepezil  10 mg Oral QHS  . enoxaparin (LOVENOX) injection  40 mg Subcutaneous Q24H  . memantine  28 mg Oral Daily  . pantoprazole  40 mg Oral Daily  . rosuvastatin  20 mg Oral q1800   Continuous Infusions: . sodium chloride 125 mL/hr at 03/29/16 0809   PRN Meds:.haloperidol lactate, HYDROmorphone (DILAUDID) injection, ondansetron **OR** ondansetron (ZOFRAN) IV, senna-docusate  Micro Results No results found for this or any previous visit (from the past 240 hour(s)).  Radiology Reports Ct Chest W Contrast  03/29/2016  CLINICAL DATA:  82 year old female with upper back and abdominal pain for 3 days. EXAM: CT CHEST, ABDOMEN, AND PELVIS WITH CONTRAST TECHNIQUE: Multidetector CT imaging of the chest, abdomen and pelvis was performed following the standard protocol during bolus  administration of intravenous contrast. CONTRAST:  1 ISOVUE-300 IOPAMIDOL (ISOVUE-300) INJECTION 61% COMPARISON:  None.  FINDINGS: CT CHEST FINDINGS Mediastinum/Lymph Nodes: Tortuous atherosclerotic thoracic aorta without aneurysm or dissection. Dense coronary artery calcifications or stents. Heart size is normal. No pericardial effusion. No adenopathy. Lungs/Pleura: No pulmonary mass, infiltrate, or effusion. Tiny 3 mm subpleural nodule left upper lobe series 3, image 55. Scattered atelectasis in both lower lobes and right middle lobe. Musculoskeletal: Multiple compression deformities in the thoracic spine. T10 and T12 compression deformities with vertebral augmentation. T7 and T8 compression deformities without augmentation. Acuity of the T7 and T8 fracture is uncertain. Exaggerated thoracic kyphosis and multilevel degenerative change. CT ABDOMEN PELVIS FINDINGS Hepatobiliary: Distended gallbladder with gallbladder wall thickening and pericholecystic edema. Calcified gallstone in the gallbladder neck. Common bile duct measures 10 mm, likely normal for age. No focal hepatic lesion. Pancreas: No mass, inflammatory changes, or other significant abnormality. Spleen: Within normal limits in size and appearance. Adrenals/Urinary Tract: No masses identified. No evidence of hydronephrosis. Multiple bilateral parapelvic cysts in both kidneys. Cortical cyst in the upper left kidney. Stomach/Bowel: No evidence of obstruction, inflammatory process, or abnormal fluid collections. Moderate stool burden. Scattered distal colonic diverticular without diverticulitis. Vascular/Lymphatic: No pathologically enlarged lymph nodes. No evidence of abdominal aortic aneurysm. Dense atherosclerosis of the abdominal aorta and its branches, no aneurysm. Reproductive: Uterus is surgically absent. No adnexal mass. Streak artifact obscures detailed evaluation. Other: None. Musculoskeletal: Multilevel degenerative change throughout the lumbar spine, no compression deformity or acute abnormality. An bilateral hip arthroplasties. IMPRESSION: 1. CT findings consistent  with acute cholecystitis. No biliary dilatation. 2. Multiple compression deformities throughout the thoracic spine, chronic at T10 and T12 with vertebral augmentation. T7 and T8 compression fractures of uncertain acuity. 3. Diffuse atherosclerosis including coronary artery calcifications. 4. Tiny 3 mm subpleural nodule in the left upper lobe, this is likely infectious or inflammatory. No follow-up needed if patient is low-risk. Non-contrast chest CT can be considered in 12 months if patient is high-risk. This recommendation follows the consensus statement: Guidelines for Management of Incidental Pulmonary Nodules Detected on CT Images:From the Fleischner Society 2017; published online before print (10.1148/radiol.IJ:2314499). Electronically Signed   By: Jeb Levering M.D.   On: 03/29/2016 02:56   Ct Abdomen Pelvis W Contrast  03/29/2016  CLINICAL DATA:  82 year old female with upper back and abdominal pain for 3 days. EXAM: CT CHEST, ABDOMEN, AND PELVIS WITH CONTRAST TECHNIQUE: Multidetector CT imaging of the chest, abdomen and pelvis was performed following the standard protocol during bolus administration of intravenous contrast. CONTRAST:  1 ISOVUE-300 IOPAMIDOL (ISOVUE-300) INJECTION 61% COMPARISON:  None. FINDINGS: CT CHEST FINDINGS Mediastinum/Lymph Nodes: Tortuous atherosclerotic thoracic aorta without aneurysm or dissection. Dense coronary artery calcifications or stents. Heart size is normal. No pericardial effusion. No adenopathy. Lungs/Pleura: No pulmonary mass, infiltrate, or effusion. Tiny 3 mm subpleural nodule left upper lobe series 3, image 55. Scattered atelectasis in both lower lobes and right middle lobe. Musculoskeletal: Multiple compression deformities in the thoracic spine. T10 and T12 compression deformities with vertebral augmentation. T7 and T8 compression deformities without augmentation. Acuity of the T7 and T8 fracture is uncertain. Exaggerated thoracic kyphosis and multilevel  degenerative change. CT ABDOMEN PELVIS FINDINGS Hepatobiliary: Distended gallbladder with gallbladder wall thickening and pericholecystic edema. Calcified gallstone in the gallbladder neck. Common bile duct measures 10 mm, likely normal for age. No focal hepatic lesion. Pancreas: No mass, inflammatory changes, or other significant abnormality. Spleen: Within normal limits in size and appearance. Adrenals/Urinary Tract: No  masses identified. No evidence of hydronephrosis. Multiple bilateral parapelvic cysts in both kidneys. Cortical cyst in the upper left kidney. Stomach/Bowel: No evidence of obstruction, inflammatory process, or abnormal fluid collections. Moderate stool burden. Scattered distal colonic diverticular without diverticulitis. Vascular/Lymphatic: No pathologically enlarged lymph nodes. No evidence of abdominal aortic aneurysm. Dense atherosclerosis of the abdominal aorta and its branches, no aneurysm. Reproductive: Uterus is surgically absent. No adnexal mass. Streak artifact obscures detailed evaluation. Other: None. Musculoskeletal: Multilevel degenerative change throughout the lumbar spine, no compression deformity or acute abnormality. An bilateral hip arthroplasties. IMPRESSION: 1. CT findings consistent with acute cholecystitis. No biliary dilatation. 2. Multiple compression deformities throughout the thoracic spine, chronic at T10 and T12 with vertebral augmentation. T7 and T8 compression fractures of uncertain acuity. 3. Diffuse atherosclerosis including coronary artery calcifications. 4. Tiny 3 mm subpleural nodule in the left upper lobe, this is likely infectious or inflammatory. No follow-up needed if patient is low-risk. Non-contrast chest CT can be considered in 12 months if patient is high-risk. This recommendation follows the consensus statement: Guidelines for Management of Incidental Pulmonary Nodules Detected on CT Images:From the Fleischner Society 2017; published online before print  (10.1148/radiol.[id number]). Electronically Signed   By: Jeb Levering M.D.   On: 03/29/2016 02:56    Time Spent in minutes  15   Louellen Molder M.D on 03/29/2016 at 3:55 PM  Between 7am to 7pm - Pager - [phone]  After 7pm go to [email or web address] - password [facility]  [facility] -  Office  [phone]

## 2016-03-30 DIAGNOSIS — F0390 Unspecified dementia without behavioral disturbance: Secondary | ICD-10-CM

## 2016-03-30 DIAGNOSIS — K810 Acute cholecystitis: Secondary | ICD-10-CM

## 2016-03-30 DIAGNOSIS — E871 Hypo-osmolality and hyponatremia: Secondary | ICD-10-CM

## 2016-03-30 DIAGNOSIS — R7989 Other specified abnormal findings of blood chemistry: Secondary | ICD-10-CM

## 2016-03-30 MED ORDER — LORAZEPAM 2 MG/ML IJ SOLN
1.0000 mg | Freq: Once | INTRAMUSCULAR | Status: AC
Start: 2016-03-30 — End: 2016-03-30
  Administered 2016-03-30: 1 mg via INTRAVENOUS
  Filled 2016-03-30: qty 1

## 2016-03-30 NOTE — Progress Notes (Addendum)
Saw pt and daughter again at bedside per their request. Daughter apparently spoke with pt's PCP and now has changed her mind regarding cholecystectomy.  Daughter now requesting cholecystectomy if deemed feasible by general surgery.  Patient is a low to moderate risk cardiac risk for surgery.  No further interventions presently will alter that risk.  There is no hx of MI/CAD, stroke, DM CKD.  General surgery notified of daughter's change of mind.  Check BMP and CBC in am.  EKG no concerning ischemic changes.  DTat

## 2016-03-30 NOTE — Progress Notes (Signed)
Patient ID: Alexis York, female   DOB: 09/06/1922, 82 y.o.   MRN: [id number]     [facility]      [address] [place], [place] [address]    Phone: [phone] FAX: [phone]     Subjective: No n/v. Tolerating clears.  No complains of pain. Afebrile.   Objective:  Vital signs:  Filed Vitals:   03/29/16 1704 03/29/16 1942 03/29/16 2121 03/30/16 0604  BP: 123/101 117/34  121/43  Pulse: 63 57  56  Temp: 98.2 F (36.8 C) 98.3 F (36.8 C)  97.4 F (36.3 C)  TempSrc: Oral Oral  Oral  Resp: 17 17  17   Height:      Weight:   71.1 kg (156 lb 12 oz)   SpO2: 96% 96%  98%    Last BM Date: 03/29/16  Intake/Output   Yesterday:  05/23 0701 - 05/24 0700 In: 3381.3 [P.O.:600; I.V.:2731.3; IV Piggyback:50] Out: 300 [Urine:300] This shift:    I/O last 3 completed shifts: In: 3381.3 [P.O.:600; I.V.:2731.3; IV Piggyback:50] Out: 300 [Urine:300]    Physical Exam: General: Pt alert to self.   Abdomen: Soft. Nondistended. ttp ruq, less when compared to yesterday. No evidence of peritonitis. No incarcerated hernias.    Problem List:   Principal Problem:   Cholecystitis Active Problems:   Senile dementia, uncomplicated   Hyponatremia   Elevated troponin   Acute cholecystitis    Results:   Labs: Results for orders placed or performed during the hospital encounter of 03/28/16 (from the past 48 hour(s))  Lipase, blood     Status: None   Collection Time: 03/28/16  2:46 PM  Result Value Ref Range   Lipase 23 11 - 51 U/L  Comprehensive metabolic panel     Status: Abnormal   Collection Time: 03/28/16  2:46 PM  Result Value Ref Range   Sodium 133 (L) 135 - 145 mmol/L   Potassium 3.7 3.5 - 5.1 mmol/L   Chloride 102 101 - 111 mmol/L   CO2 23 22 - 32 mmol/L   Glucose, Bld 112 (H) 65 - 99 mg/dL   BUN 17 6 - 20 mg/dL   Creatinine, Ser 0.92 0.44 - 1.00 mg/dL   Calcium 9.0 8.9 - 10.3 mg/dL   Total Protein 6.6 6.5 - 8.1  g/dL   Albumin 3.0 (L) 3.5 - 5.0 g/dL   AST 22 15 - 41 U/L   ALT 17 14 - 54 U/L   Alkaline Phosphatase 75 38 - 126 U/L   Total Bilirubin 1.4 (H) 0.3 - 1.2 mg/dL   GFR calc non Af Amer 53 (L) >60 mL/min   GFR calc Af Amer >60 >60 mL/min    Comment: (NOTE) The eGFR has been calculated using the CKD EPI equation. This calculation has not been validated in all clinical situations. eGFR's persistently <60 mL/min signify possible Chronic Kidney Disease.    Anion gap 8 5 - 15  CBC     Status: Abnormal   Collection Time: 03/28/16  2:46 PM  Result Value Ref Range   WBC 15.4 (H) 4.0 - 10.5 K/uL   RBC 4.69 3.87 - 5.11 MIL/uL   Hemoglobin 13.9 12.0 - 15.0 g/dL   HCT 43.1 36.0 - 46.0 %   MCV 91.9 78.0 - 100.0 fL   MCH 29.6 26.0 - 34.0 pg   MCHC 32.3 30.0 - 36.0 g/dL   RDW 13.9 11.5 - 15.5 %   Platelets 243  150 - 400 K/uL  Troponin I     Status: Abnormal   Collection Time: 03/28/16 10:42 PM  Result Value Ref Range   Troponin I 0.04 (H) <0.031 ng/mL    Comment:        PERSISTENTLY INCREASED TROPONIN VALUES IN THE RANGE OF 0.04-0.49 ng/mL CAN BE SEEN IN:       -UNSTABLE ANGINA       -CONGESTIVE HEART FAILURE       -MYOCARDITIS       -CHEST TRAUMA       -ARRYHTHMIAS       -LATE PRESENTING MYOCARDIAL INFARCTION       -COPD   CLINICAL FOLLOW-UP RECOMMENDED.   Urinalysis, Routine w reflex microscopic     Status: Abnormal   Collection Time: 03/29/16  1:21 AM  Result Value Ref Range   Color, Urine YELLOW YELLOW   APPearance HAZY (A) CLEAR   Specific Gravity, Urine 1.025 1.005 - 1.030   pH 6.5 5.0 - 8.0   Glucose, UA NEGATIVE NEGATIVE mg/dL   Hgb urine dipstick NEGATIVE NEGATIVE   Bilirubin Urine NEGATIVE NEGATIVE   Ketones, ur 15 (A) NEGATIVE mg/dL   Protein, ur NEGATIVE NEGATIVE mg/dL   Nitrite NEGATIVE NEGATIVE   Leukocytes, UA NEGATIVE NEGATIVE    Comment: MICROSCOPIC NOT DONE ON URINES WITH NEGATIVE PROTEIN, BLOOD, LEUKOCYTES, NITRITE, OR GLUCOSE <1000 mg/dL.  Osmolality      Status: None   Collection Time: 03/29/16  6:37 AM  Result Value Ref Range   Osmolality 282 275 - 295 mOsm/kg  Troponin I     Status: None   Collection Time: 03/29/16  6:37 AM  Result Value Ref Range   Troponin I <0.03 <0.031 ng/mL    Comment:        NO INDICATION OF MYOCARDIAL INJURY.   CBC     Status: None   Collection Time: 03/29/16  6:37 AM  Result Value Ref Range   WBC 10.5 4.0 - 10.5 K/uL   RBC 4.01 3.87 - 5.11 MIL/uL   Hemoglobin 12.1 12.0 - 15.0 g/dL   HCT 37.6 36.0 - 46.0 %   MCV 93.8 78.0 - 100.0 fL   MCH 30.2 26.0 - 34.0 pg   MCHC 32.2 30.0 - 36.0 g/dL   RDW 14.0 11.5 - 15.5 %   Platelets 200 150 - 400 K/uL  Comprehensive metabolic panel     Status: Abnormal   Collection Time: 03/29/16  6:37 AM  Result Value Ref Range   Sodium 135 135 - 145 mmol/L   Potassium 3.4 (L) 3.5 - 5.1 mmol/L   Chloride 101 101 - 111 mmol/L   CO2 26 22 - 32 mmol/L   Glucose, Bld 85 65 - 99 mg/dL   BUN 15 6 - 20 mg/dL   Creatinine, Ser 0.71 0.44 - 1.00 mg/dL   Calcium 8.1 (L) 8.9 - 10.3 mg/dL   Total Protein 5.7 (L) 6.5 - 8.1 g/dL   Albumin 2.5 (L) 3.5 - 5.0 g/dL   AST 15 15 - 41 U/L   ALT 13 (L) 14 - 54 U/L   Alkaline Phosphatase 63 38 - 126 U/L   Total Bilirubin 1.2 0.3 - 1.2 mg/dL   GFR calc non Af Amer >60 >60 mL/min   GFR calc Af Amer >60 >60 mL/min    Comment: (NOTE) The eGFR has been calculated using the CKD EPI equation. This calculation has not been validated in all clinical situations. eGFR's persistently <60 mL/min  signify possible Chronic Kidney Disease.    Anion gap 8 5 - 15    Imaging / Studies: Ct Chest W Contrast  03/29/2016  CLINICAL DATA:  82 year old female with upper back and abdominal pain for 3 days. EXAM: CT CHEST, ABDOMEN, AND PELVIS WITH CONTRAST TECHNIQUE: Multidetector CT imaging of the chest, abdomen and pelvis was performed following the standard protocol during bolus administration of intravenous contrast. CONTRAST:  1 ISOVUE-300 IOPAMIDOL  (ISOVUE-300) INJECTION 61% COMPARISON:  None. FINDINGS: CT CHEST FINDINGS Mediastinum/Lymph Nodes: Tortuous atherosclerotic thoracic aorta without aneurysm or dissection. Dense coronary artery calcifications or stents. Heart size is normal. No pericardial effusion. No adenopathy. Lungs/Pleura: No pulmonary mass, infiltrate, or effusion. Tiny 3 mm subpleural nodule left upper lobe series 3, image 55. Scattered atelectasis in both lower lobes and right middle lobe. Musculoskeletal: Multiple compression deformities in the thoracic spine. T10 and T12 compression deformities with vertebral augmentation. T7 and T8 compression deformities without augmentation. Acuity of the T7 and T8 fracture is uncertain. Exaggerated thoracic kyphosis and multilevel degenerative change. CT ABDOMEN PELVIS FINDINGS Hepatobiliary: Distended gallbladder with gallbladder wall thickening and pericholecystic edema. Calcified gallstone in the gallbladder neck. Common bile duct measures 10 mm, likely normal for age. No focal hepatic lesion. Pancreas: No mass, inflammatory changes, or other significant abnormality. Spleen: Within normal limits in size and appearance. Adrenals/Urinary Tract: No masses identified. No evidence of hydronephrosis. Multiple bilateral parapelvic cysts in both kidneys. Cortical cyst in the upper left kidney. Stomach/Bowel: No evidence of obstruction, inflammatory process, or abnormal fluid collections. Moderate stool burden. Scattered distal colonic diverticular without diverticulitis. Vascular/Lymphatic: No pathologically enlarged lymph nodes. No evidence of abdominal aortic aneurysm. Dense atherosclerosis of the abdominal aorta and its branches, no aneurysm. Reproductive: Uterus is surgically absent. No adnexal mass. Streak artifact obscures detailed evaluation. Other: None. Musculoskeletal: Multilevel degenerative change throughout the lumbar spine, no compression deformity or acute abnormality. An bilateral hip  arthroplasties. IMPRESSION: 1. CT findings consistent with acute cholecystitis. No biliary dilatation. 2. Multiple compression deformities throughout the thoracic spine, chronic at T10 and T12 with vertebral augmentation. T7 and T8 compression fractures of uncertain acuity. 3. Diffuse atherosclerosis including coronary artery calcifications. 4. Tiny 3 mm subpleural nodule in the left upper lobe, this is likely infectious or inflammatory. No follow-up needed if patient is low-risk. Non-contrast chest CT can be considered in 12 months if patient is high-risk. This recommendation follows the consensus statement: Guidelines for Management of Incidental Pulmonary Nodules Detected on CT Images:From the Fleischner Society 2017; published online before print (10.1148/[id number]). Electronically Signed   By: Jeb Levering M.D.   On: 03/29/2016 02:56   Ct Abdomen Pelvis W Contrast  03/29/2016  CLINICAL DATA:  82 year old female with upper back and abdominal pain for 3 days. EXAM: CT CHEST, ABDOMEN, AND PELVIS WITH CONTRAST TECHNIQUE: Multidetector CT imaging of the chest, abdomen and pelvis was performed following the standard protocol during bolus administration of intravenous contrast. CONTRAST:  1 ISOVUE-300 IOPAMIDOL (ISOVUE-300) INJECTION 61% COMPARISON:  None. FINDINGS: CT CHEST FINDINGS Mediastinum/Lymph Nodes: Tortuous atherosclerotic thoracic aorta without aneurysm or dissection. Dense coronary artery calcifications or stents. Heart size is normal. No pericardial effusion. No adenopathy. Lungs/Pleura: No pulmonary mass, infiltrate, or effusion. Tiny 3 mm subpleural nodule left upper lobe series 3, image 55. Scattered atelectasis in both lower lobes and right middle lobe. Musculoskeletal: Multiple compression deformities in the thoracic spine. T10 and T12 compression deformities with vertebral augmentation. T7 and T8 compression deformities without augmentation. Acuity of the T7  and T8 fracture is  uncertain. Exaggerated thoracic kyphosis and multilevel degenerative change. CT ABDOMEN PELVIS FINDINGS Hepatobiliary: Distended gallbladder with gallbladder wall thickening and pericholecystic edema. Calcified gallstone in the gallbladder neck. Common bile duct measures 10 mm, likely normal for age. No focal hepatic lesion. Pancreas: No mass, inflammatory changes, or other significant abnormality. Spleen: Within normal limits in size and appearance. Adrenals/Urinary Tract: No masses identified. No evidence of hydronephrosis. Multiple bilateral parapelvic cysts in both kidneys. Cortical cyst in the upper left kidney. Stomach/Bowel: No evidence of obstruction, inflammatory process, or abnormal fluid collections. Moderate stool burden. Scattered distal colonic diverticular without diverticulitis. Vascular/Lymphatic: No pathologically enlarged lymph nodes. No evidence of abdominal aortic aneurysm. Dense atherosclerosis of the abdominal aorta and its branches, no aneurysm. Reproductive: Uterus is surgically absent. No adnexal mass. Streak artifact obscures detailed evaluation. Other: None. Musculoskeletal: Multilevel degenerative change throughout the lumbar spine, no compression deformity or acute abnormality. An bilateral hip arthroplasties. IMPRESSION: 1. CT findings consistent with acute cholecystitis. No biliary dilatation. 2. Multiple compression deformities throughout the thoracic spine, chronic at T10 and T12 with vertebral augmentation. T7 and T8 compression fractures of uncertain acuity. 3. Diffuse atherosclerosis including coronary artery calcifications. 4. Tiny 3 mm subpleural nodule in the left upper lobe, this is likely infectious or inflammatory. No follow-up needed if patient is low-risk. Non-contrast chest CT can be considered in 12 months if patient is high-risk. This recommendation follows the consensus statement: Guidelines for Management of Incidental Pulmonary Nodules Detected on CT Images:From the  Fleischner Society 2017; published online before print (10.1148/[id number]). Electronically Signed   By: Jeb Levering M.D.   On: 03/29/2016 02:56    Medications / Allergies:  Scheduled Meds: . aspirin  81 mg Oral Daily  . cefTRIAXone (ROCEPHIN)  IV  2 g Intravenous Q24H  . celecoxib  200 mg Oral BID  . divalproex  125 mg Oral BID  . donepezil  10 mg Oral QHS  . enoxaparin (LOVENOX) injection  40 mg Subcutaneous Q24H  . memantine  28 mg Oral Daily  . pantoprazole  40 mg Oral Daily  . rosuvastatin  20 mg Oral q1800   Continuous Infusions: . sodium chloride 125 mL/hr at 03/30/16 0904   PRN Meds:.haloperidol lactate, HYDROmorphone (DILAUDID) injection, ondansetron **OR** ondansetron (ZOFRAN) IV, senna-docusate  Antibiotics: Anti-infectives    Start     Dose/Rate Route Frequency Ordered Stop   03/29/16 1600  cefTRIAXone (ROCEPHIN) 2 g in dextrose 5 % 50 mL IVPB     2 g 100 mL/hr over 30 Minutes Intravenous Every 24 hours 03/29/16 0624     03/29/16 0415  cefTRIAXone (ROCEPHIN) 1 g in dextrose 5 % 50 mL IVPB     1 g 100 mL/hr over 30 Minutes Intravenous  Once 03/29/16 0403 03/29/16 0514        Assessment/Plan Acute cholecystitis-less tender on exam, afebrile.  Will advance to full liquid diet.  AM labs.  Should the patient fail to improve or acutely worsen, next step would be to place a cholecystostomy tube.  Daughter wishes to forego a cholecystectomy.  ID-rocephin VTE prophylaxis-SCD/lovenox Dispo-continue inpatient   Erby Pian, [facility], [place] [facility] Pager [phone]) For consults and floor pages call [phone]([id number])  03/30/2016 10:34 AM

## 2016-03-30 NOTE — Progress Notes (Signed)
PROGRESS NOTE  Alexis York [id number] DOB: 1922/09/29 DOA: 03/28/2016 PCP: Donnajean Lopes, MD  Brief History:  82 year old female with history of dementia, hyperlipidemia, GERD presented with abdominal pain 4 days with decreased oral intake and associated vomiting. CT of the abdomen and pelvis upon presentation revealed distended gallbladder with wall thickening and pericholecystic edema.  General surgery was consulted to assist with management. Patient's family was hesitant to pursue surgery. The patient was treated medically with antibiotics with clinical improvement.  Assessment/Plan: Acute Cholecystitis -IV fluids. empiric rocephin.   -CCS discussed options of cholecystostomy tube but daughter feels she may pull it out.  -03/30/2016--daughter wants to continue medical therapy unless the patient worsens clinically -Advanced to full liquids -Plan to discharge home with oral antibiotics  Dementia:  -Continue Namenda, Aricept and Depakote. Prn haldol for agitation.  Hyponatremia -Secondary to volume depletion -Improved  Elevated troponin:  Minimal elevation, asymptmoatic. -secondary to demand ischemia  Arthritis: -Continue celecoxib  Home medications: -Continue PPI, statin, aspirin    Disposition Plan:   Home 5/25 or 5/26 if stable Family Communication:   Daughter updated at beside  Consultants:  CCS  Code Status:  DNR   Subjective: Patient denies fevers, chills, headache, chest pain, dyspnea, nausea, vomiting, diarrhea, abdominal pain   Objective: Filed Vitals:   03/29/16 1942 03/29/16 2121 03/30/16 0604 03/30/16 1000  BP: 117/34  121/43 128/51  Pulse: 57  56 65  Temp: 98.3 F (36.8 C)  97.4 F (36.3 C) 97.9 F (36.6 C)  TempSrc: Oral  Oral Oral  Resp: 17  17 18   Height:      Weight:  71.1 kg (156 lb 12 oz)    SpO2: 96%  98% 98%    Intake/Output Summary (Last 24 hours) at 03/30/16 1424 Last data filed at 03/30/16 1400  Gross  per 24 hour  Intake   3530 ml  Output      0 ml  Net   3530 ml   Weight change: 2.834 kg (6 lb 4 oz) Exam:   General:  Pt is alert, follows commands appropriately, not in acute distress  HEENT: No icterus, No thrush, No neck mass, [place]/AT  Cardiovascular: RRR, S1/S2, no rubs, no gallops  Respiratory: CTA bilaterally, no wheezing, no crackles, no rhonchi  Abdomen: Soft/+BS, RUQ pain without rebound non distended, no guarding  Extremities: No edema, No lymphangitis, No petechiae, No rashes, no synovitis   Data Reviewed: I have personally reviewed following labs and imaging studies Basic Metabolic Panel:  Recent Labs Lab 03/28/16 1446 03/29/16 0637  NA 133* 135  K 3.7 3.4*  CL 102 101  CO2 23 26  GLUCOSE 112* 85  BUN 17 15  CREATININE 0.92 0.71  CALCIUM 9.0 8.1*   Liver Function Tests:  Recent Labs Lab 03/28/16 1446 03/29/16 0637  AST 22 15  ALT 17 13*  ALKPHOS 75 63  BILITOT 1.4* 1.2  PROT 6.6 5.7*  ALBUMIN 3.0* 2.5*    Recent Labs Lab 03/28/16 1446  LIPASE 23   No results for input(s): AMMONIA in the last 168 hours. Coagulation Profile: No results for input(s): INR, PROTIME in the last 168 hours. CBC:  Recent Labs Lab 03/28/16 1446 03/29/16 0637  WBC 15.4* 10.5  HGB 13.9 12.1  HCT 43.1 37.6  MCV 91.9 93.8  PLT 243 200   Cardiac Enzymes:  Recent Labs Lab 03/28/16 2242 03/29/16 0637  TROPONINI 0.04* <0.03   BNP:  Invalid input(s): POCBNP CBG: No results for input(s): GLUCAP in the last 168 hours. HbA1C: No results for input(s): HGBA1C in the last 72 hours. Urine analysis:    Component Value Date/Time   COLORURINE YELLOW 03/29/2016 0121   APPEARANCEUR HAZY* 03/29/2016 0121   LABSPEC 1.025 03/29/2016 0121   PHURINE 6.5 03/29/2016 0121   GLUCOSEU NEGATIVE 03/29/2016 0121   HGBUR NEGATIVE 03/29/2016 0121   BILIRUBINUR NEGATIVE 03/29/2016 0121   KETONESUR 15* 03/29/2016 0121   PROTEINUR NEGATIVE 03/29/2016 0121   UROBILINOGEN 0.2  10/28/2014 2342   NITRITE NEGATIVE 03/29/2016 0121   LEUKOCYTESUR NEGATIVE 03/29/2016 0121   Sepsis Labs: @LABRCNTIP (procalcitonin:4,lacticidven:4) )No results found for this or any previous visit (from the past 240 hour(s)).   Scheduled Meds: . aspirin  81 mg Oral Daily  . cefTRIAXone (ROCEPHIN)  IV  2 g Intravenous Q24H  . celecoxib  200 mg Oral BID  . divalproex  125 mg Oral BID  . donepezil  10 mg Oral QHS  . enoxaparin (LOVENOX) injection  40 mg Subcutaneous Q24H  . memantine  28 mg Oral Daily  . pantoprazole  40 mg Oral Daily  . rosuvastatin  20 mg Oral q1800   Continuous Infusions: . sodium chloride 125 mL/hr at 03/30/16 [id number]    Procedures/Studies: Ct Chest W Contrast  03/29/2016  CLINICAL DATA:  82 year old female with upper back and abdominal pain for 3 days. EXAM: CT CHEST, ABDOMEN, AND PELVIS WITH CONTRAST TECHNIQUE: Multidetector CT imaging of the chest, abdomen and pelvis was performed following the standard protocol during bolus administration of intravenous contrast. CONTRAST:  1 ISOVUE-300 IOPAMIDOL (ISOVUE-300) INJECTION 61% COMPARISON:  None. FINDINGS: CT CHEST FINDINGS Mediastinum/Lymph Nodes: Tortuous atherosclerotic thoracic aorta without aneurysm or dissection. Dense coronary artery calcifications or stents. Heart size is normal. No pericardial effusion. No adenopathy. Lungs/Pleura: No pulmonary mass, infiltrate, or effusion. Tiny 3 mm subpleural nodule left upper lobe series 3, image 55. Scattered atelectasis in both lower lobes and right middle lobe. Musculoskeletal: Multiple compression deformities in the thoracic spine. T10 and T12 compression deformities with vertebral augmentation. T7 and T8 compression deformities without augmentation. Acuity of the T7 and T8 fracture is uncertain. Exaggerated thoracic kyphosis and multilevel degenerative change. CT ABDOMEN PELVIS FINDINGS Hepatobiliary: Distended gallbladder with gallbladder wall thickening and pericholecystic  edema. Calcified gallstone in the gallbladder neck. Common bile duct measures 10 mm, likely normal for age. No focal hepatic lesion. Pancreas: No mass, inflammatory changes, or other significant abnormality. Spleen: Within normal limits in size and appearance. Adrenals/Urinary Tract: No masses identified. No evidence of hydronephrosis. Multiple bilateral parapelvic cysts in both kidneys. Cortical cyst in the upper left kidney. Stomach/Bowel: No evidence of obstruction, inflammatory process, or abnormal fluid collections. Moderate stool burden. Scattered distal colonic diverticular without diverticulitis. Vascular/Lymphatic: No pathologically enlarged lymph nodes. No evidence of abdominal aortic aneurysm. Dense atherosclerosis of the abdominal aorta and its branches, no aneurysm. Reproductive: Uterus is surgically absent. No adnexal mass. Streak artifact obscures detailed evaluation. Other: None. Musculoskeletal: Multilevel degenerative change throughout the lumbar spine, no compression deformity or acute abnormality. An bilateral hip arthroplasties. IMPRESSION: 1. CT findings consistent with acute cholecystitis. No biliary dilatation. 2. Multiple compression deformities throughout the thoracic spine, chronic at T10 and T12 with vertebral augmentation. T7 and T8 compression fractures of uncertain acuity. 3. Diffuse atherosclerosis including coronary artery calcifications. 4. Tiny 3 mm subpleural nodule in the left upper lobe, this is likely infectious or inflammatory. No follow-up needed if patient is low-risk. Non-contrast chest CT  can be considered in 12 months if patient is high-risk. This recommendation follows the consensus statement: Guidelines for Management of Incidental Pulmonary Nodules Detected on CT Images:From the Fleischner Society 2017; published online before print (10.1148/radiol.IJ:2314499). Electronically Signed   By: Jeb Levering M.D.   On: 03/29/2016 02:56   Ct Abdomen Pelvis W  Contrast  03/29/2016  CLINICAL DATA:  82 year old female with upper back and abdominal pain for 3 days. EXAM: CT CHEST, ABDOMEN, AND PELVIS WITH CONTRAST TECHNIQUE: Multidetector CT imaging of the chest, abdomen and pelvis was performed following the standard protocol during bolus administration of intravenous contrast. CONTRAST:  1 ISOVUE-300 IOPAMIDOL (ISOVUE-300) INJECTION 61% COMPARISON:  None. FINDINGS: CT CHEST FINDINGS Mediastinum/Lymph Nodes: Tortuous atherosclerotic thoracic aorta without aneurysm or dissection. Dense coronary artery calcifications or stents. Heart size is normal. No pericardial effusion. No adenopathy. Lungs/Pleura: No pulmonary mass, infiltrate, or effusion. Tiny 3 mm subpleural nodule left upper lobe series 3, image 55. Scattered atelectasis in both lower lobes and right middle lobe. Musculoskeletal: Multiple compression deformities in the thoracic spine. T10 and T12 compression deformities with vertebral augmentation. T7 and T8 compression deformities without augmentation. Acuity of the T7 and T8 fracture is uncertain. Exaggerated thoracic kyphosis and multilevel degenerative change. CT ABDOMEN PELVIS FINDINGS Hepatobiliary: Distended gallbladder with gallbladder wall thickening and pericholecystic edema. Calcified gallstone in the gallbladder neck. Common bile duct measures 10 mm, likely normal for age. No focal hepatic lesion. Pancreas: No mass, inflammatory changes, or other significant abnormality. Spleen: Within normal limits in size and appearance. Adrenals/Urinary Tract: No masses identified. No evidence of hydronephrosis. Multiple bilateral parapelvic cysts in both kidneys. Cortical cyst in the upper left kidney. Stomach/Bowel: No evidence of obstruction, inflammatory process, or abnormal fluid collections. Moderate stool burden. Scattered distal colonic diverticular without diverticulitis. Vascular/Lymphatic: No pathologically enlarged lymph nodes. No evidence of abdominal  aortic aneurysm. Dense atherosclerosis of the abdominal aorta and its branches, no aneurysm. Reproductive: Uterus is surgically absent. No adnexal mass. Streak artifact obscures detailed evaluation. Other: None. Musculoskeletal: Multilevel degenerative change throughout the lumbar spine, no compression deformity or acute abnormality. An bilateral hip arthroplasties. IMPRESSION: 1. CT findings consistent with acute cholecystitis. No biliary dilatation. 2. Multiple compression deformities throughout the thoracic spine, chronic at T10 and T12 with vertebral augmentation. T7 and T8 compression fractures of uncertain acuity. 3. Diffuse atherosclerosis including coronary artery calcifications. 4. Tiny 3 mm subpleural nodule in the left upper lobe, this is likely infectious or inflammatory. No follow-up needed if patient is low-risk. Non-contrast chest CT can be considered in 12 months if patient is high-risk. This recommendation follows the consensus statement: Guidelines for Management of Incidental Pulmonary Nodules Detected on CT Images:From the Fleischner Society 2017; published online before print (10.1148/radiol.IJ:2314499). Electronically Signed   By: Jeb Levering M.D.   On: 03/29/2016 02:56    Zimal Weisensel, DO  [facility] Pager [phone]  If 7PM-7AM, please contact night-coverage [email or web address] Password [email or web address] 03/30/2016, 2:24 PM   LOS: 1 day

## 2016-03-31 ENCOUNTER — Inpatient Hospital Stay (HOSPITAL_COMMUNITY): Payer: Medicare Other | Admitting: Certified Registered"

## 2016-03-31 ENCOUNTER — Encounter (HOSPITAL_COMMUNITY)
Admission: EM | Disposition: A | Discharge status: Skilled Nursing Facility | Payer: Self-pay | Source: Home / Self Care | Attending: Internal Medicine

## 2016-03-31 HISTORY — PX: CHOLECYSTECTOMY: SHX55

## 2016-03-31 LAB — COMPREHENSIVE METABOLIC PANEL
ALT: 10 U/L — AB (ref 14–54)
AST: 13 U/L — ABNORMAL LOW (ref 15–41)
Albumin: 2 g/dL — ABNORMAL LOW (ref 3.5–5.0)
Alkaline Phosphatase: 59 U/L (ref 38–126)
Anion gap: 4 — ABNORMAL LOW (ref 5–15)
BUN: <5 mg/dL — ABNORMAL LOW (ref 6–20)
CHLORIDE: 114 mmol/L — AB (ref 101–111)
CO2: 23 mmol/L (ref 22–32)
CREATININE: 0.67 mg/dL (ref 0.44–1.00)
Calcium: 7.5 mg/dL — ABNORMAL LOW (ref 8.9–10.3)
GFR calc non Af Amer: >60 mL/min (ref >60)
Glucose, Bld: 87 mg/dL (ref 65–99)
Potassium: 3.1 mmol/L — ABNORMAL LOW (ref 3.5–5.1)
SODIUM: 141 mmol/L (ref 135–145)
Total Bilirubin: 0.5 mg/dL (ref 0.3–1.2)
Total Protein: 4.7 g/dL — ABNORMAL LOW (ref 6.5–8.1)

## 2016-03-31 LAB — CBC
HCT: 33.5 % — ABNORMAL LOW (ref 36.0–46.0)
Hemoglobin: 10.4 g/dL — ABNORMAL LOW (ref 12.0–15.0)
MCH: 29.5 pg (ref 26.0–34.0)
MCHC: 31 g/dL (ref 30.0–36.0)
MCV: 95.2 fL (ref 78.0–100.0)
PLATELETS: 185 10*3/uL (ref 150–400)
RBC: 3.52 MIL/uL — AB (ref 3.87–5.11)
RDW: 14 % (ref 11.5–15.5)
WBC: 7.5 10*3/uL (ref 4.0–10.5)

## 2016-03-31 LAB — SURGICAL PCR SCREEN
MRSA, PCR: NEGATIVE
STAPHYLOCOCCUS AUREUS: NEGATIVE

## 2016-03-31 SURGERY — LAPAROSCOPIC CHOLECYSTECTOMY
Anesthesia: General | Site: Abdomen

## 2016-03-31 MED ORDER — 0.9 % SODIUM CHLORIDE (POUR BTL) OPTIME
TOPICAL | Status: DC | PRN
  Administered 2016-03-31: 1000 mL

## 2016-03-31 MED ORDER — KCL IN DEXTROSE-NACL 20-5-0.9 MEQ/L-%-% IV SOLN
INTRAVENOUS | Status: DC
  Administered 2016-03-31 – 2016-04-02 (×4): via INTRAVENOUS
  Filled 2016-03-31 (×8): qty 1000

## 2016-03-31 MED ORDER — LACTATED RINGERS IV SOLN
INTRAVENOUS | Status: DC
  Administered 2016-03-31: 12:00:00 via INTRAVENOUS

## 2016-03-31 MED ORDER — ONDANSETRON HCL 4 MG/2ML IJ SOLN
INTRAMUSCULAR | Status: AC
  Filled 2016-03-31: qty 2

## 2016-03-31 MED ORDER — ROCURONIUM BROMIDE 50 MG/5ML IV SOLN
INTRAVENOUS | Status: AC
  Filled 2016-03-31: qty 1

## 2016-03-31 MED ORDER — POTASSIUM CHLORIDE CRYS ER 20 MEQ PO TBCR
40.0000 meq | EXTENDED_RELEASE_TABLET | Freq: Once | ORAL | Status: AC
  Administered 2016-03-31: 40 meq via ORAL
  Filled 2016-03-31: qty 2

## 2016-03-31 MED ORDER — LIDOCAINE HCL (CARDIAC) 20 MG/ML IV SOLN
INTRAVENOUS | Status: DC | PRN
  Administered 2016-03-31: 60 mg via INTRAVENOUS

## 2016-03-31 MED ORDER — HEMOSTATIC AGENTS (NO CHARGE) OPTIME
TOPICAL | Status: DC | PRN
  Administered 2016-03-31: 1 via TOPICAL

## 2016-03-31 MED ORDER — CEFAZOLIN SODIUM 1 G IJ SOLR
INTRAMUSCULAR | Status: DC | PRN
  Administered 2016-03-31: 2 g via INTRAMUSCULAR

## 2016-03-31 MED ORDER — ROCURONIUM BROMIDE 100 MG/10ML IV SOLN
INTRAVENOUS | Status: DC | PRN
  Administered 2016-03-31: 10 mg via INTRAVENOUS

## 2016-03-31 MED ORDER — BUPIVACAINE-EPINEPHRINE 0.25% -1:200000 IJ SOLN
INTRAMUSCULAR | Status: DC | PRN
  Administered 2016-03-31: 20 mL

## 2016-03-31 MED ORDER — ESMOLOL HCL 100 MG/10ML IV SOLN
INTRAVENOUS | Status: AC
  Filled 2016-03-31: qty 10

## 2016-03-31 MED ORDER — PROPOFOL 10 MG/ML IV BOLUS
INTRAVENOUS | Status: DC | PRN
  Administered 2016-03-31: 120 mg via INTRAVENOUS

## 2016-03-31 MED ORDER — LIDOCAINE 2% (20 MG/ML) 5 ML SYRINGE
INTRAMUSCULAR | Status: AC
  Filled 2016-03-31: qty 5

## 2016-03-31 MED ORDER — SUCCINYLCHOLINE CHLORIDE 20 MG/ML IJ SOLN
INTRAMUSCULAR | Status: DC | PRN
  Administered 2016-03-31: 100 mg via INTRAVENOUS

## 2016-03-31 MED ORDER — SODIUM CHLORIDE 0.9 % IR SOLN
Status: DC | PRN
  Administered 2016-03-31: 1000 mL

## 2016-03-31 MED ORDER — TRAMADOL HCL 50 MG PO TABS
50.0000 mg | ORAL_TABLET | Freq: Four times a day (QID) | ORAL | Status: DC | PRN
  Administered 2016-04-02 – 2016-04-03 (×2): 50 mg via ORAL
  Filled 2016-03-31 (×2): qty 1

## 2016-03-31 MED ORDER — SUGAMMADEX SODIUM 200 MG/2ML IV SOLN
INTRAVENOUS | Status: AC
  Filled 2016-03-31: qty 2

## 2016-03-31 MED ORDER — FENTANYL CITRATE (PF) 100 MCG/2ML IJ SOLN
INTRAMUSCULAR | Status: DC | PRN
  Administered 2016-03-31 (×4): 50 ug via INTRAVENOUS

## 2016-03-31 MED ORDER — SUGAMMADEX SODIUM 200 MG/2ML IV SOLN
INTRAVENOUS | Status: DC | PRN
  Administered 2016-03-31: 142.4 mg via INTRAVENOUS

## 2016-03-31 MED ORDER — FENTANYL CITRATE (PF) 250 MCG/5ML IJ SOLN
INTRAMUSCULAR | Status: AC
  Filled 2016-03-31: qty 5

## 2016-03-31 MED ORDER — PROPOFOL 10 MG/ML IV BOLUS
INTRAVENOUS | Status: AC
  Filled 2016-03-31: qty 20

## 2016-03-31 MED ORDER — ESMOLOL HCL 100 MG/10ML IV SOLN
INTRAVENOUS | Status: DC | PRN
  Administered 2016-03-31 (×2): 10 mg via INTRAVENOUS

## 2016-03-31 MED ORDER — EPHEDRINE SULFATE 50 MG/ML IJ SOLN
INTRAMUSCULAR | Status: DC | PRN
  Administered 2016-03-31: 15 mg via INTRAVENOUS
  Administered 2016-03-31: 10 mg via INTRAVENOUS

## 2016-03-31 MED ORDER — BUPIVACAINE-EPINEPHRINE (PF) 0.25% -1:200000 IJ SOLN
INTRAMUSCULAR | Status: AC
  Filled 2016-03-31: qty 30

## 2016-03-31 SURGICAL SUPPLY — 36 items
APPLIER CLIP 5 13 M/L LIGAMAX5 (MISCELLANEOUS) ×4
APR CLP MED LRG 5 ANG JAW (MISCELLANEOUS) ×2
BAG SPEC RTRVL LRG 6X4 10 (ENDOMECHANICALS) ×2
BLADE SURG CLIPPER 3M 9600 (MISCELLANEOUS) IMPLANT
CANISTER SUCTION 2500CC (MISCELLANEOUS) ×4 IMPLANT
CHLORAPREP W/TINT 26ML (MISCELLANEOUS) ×4 IMPLANT
CLIP APPLIE 5 13 M/L LIGAMAX5 (MISCELLANEOUS) ×2 IMPLANT
COVER MAYO STAND STRL (DRAPES) ×1 IMPLANT
COVER SURGICAL LIGHT HANDLE (MISCELLANEOUS) ×4 IMPLANT
DRAPE C-ARM 42X72 X-RAY (DRAPES) ×1 IMPLANT
ELECT REM PT RETURN 9FT ADLT (ELECTROSURGICAL) ×4
ELECTRODE REM PT RTRN 9FT ADLT (ELECTROSURGICAL) ×2 IMPLANT
GLOVE SURG SIGNA 7.5 PF LTX (GLOVE) ×4 IMPLANT
GOWN STRL REUS W/ TWL LRG LVL3 (GOWN DISPOSABLE) ×4 IMPLANT
GOWN STRL REUS W/ TWL XL LVL3 (GOWN DISPOSABLE) ×2 IMPLANT
GOWN STRL REUS W/TWL LRG LVL3 (GOWN DISPOSABLE) ×8
GOWN STRL REUS W/TWL XL LVL3 (GOWN DISPOSABLE) ×4
HEMOSTAT SNOW SURGICEL 2X4 (HEMOSTASIS) ×3 IMPLANT
KIT BASIN OR (CUSTOM PROCEDURE TRAY) ×4 IMPLANT
KIT ROOM TURNOVER OR (KITS) ×4 IMPLANT
LIQUID BAND (GAUZE/BANDAGES/DRESSINGS) ×4 IMPLANT
NS IRRIG 1000ML POUR BTL (IV SOLUTION) ×4 IMPLANT
PAD ARMBOARD 7.5X6 YLW CONV (MISCELLANEOUS) ×4 IMPLANT
POUCH SPECIMEN RETRIEVAL 10MM (ENDOMECHANICALS) ×4 IMPLANT
SCISSORS LAP 5X35 DISP (ENDOMECHANICALS) ×4 IMPLANT
SET CHOLANGIOGRAPH 5 50 .035 (SET/KITS/TRAYS/PACK) IMPLANT
SET IRRIG TUBING LAPAROSCOPIC (IRRIGATION / IRRIGATOR) ×4 IMPLANT
SLEEVE ENDOPATH XCEL 5M (ENDOMECHANICALS) ×8 IMPLANT
SPECIMEN JAR SMALL (MISCELLANEOUS) ×4 IMPLANT
SUT MON AB 4-0 PC3 18 (SUTURE) ×4 IMPLANT
TOWEL OR 17X24 6PK STRL BLUE (TOWEL DISPOSABLE) ×4 IMPLANT
TOWEL OR 17X26 10 PK STRL BLUE (TOWEL DISPOSABLE) ×1 IMPLANT
TRAY LAPAROSCOPIC MC (CUSTOM PROCEDURE TRAY) ×4 IMPLANT
TROCAR XCEL BLUNT TIP 100MML (ENDOMECHANICALS) ×4 IMPLANT
TROCAR XCEL NON-BLD 5MMX100MML (ENDOMECHANICALS) ×4 IMPLANT
TUBING INSUFFLATION (TUBING) ×4 IMPLANT

## 2016-03-31 NOTE — Progress Notes (Signed)
Patient ID: Alexis York, female   DOB: [birth year], 82 y.o.   MRN: [id number]     [place]      [address] [place], [place] [address]    Phone: [phone] FAX: [phone]     Subjective: Sitting up in a chair. Vss.  Afebrile. Pain is improved.  k 3.1.  Daughter at bedside.   Objective:  Vital signs:  Filed Vitals:   03/30/16 1000 03/30/16 1800 03/30/16 2043 03/31/16 0637  BP: 128/51 122/48 139/54 137/65  Pulse: 65 68 58 72  Temp: 97.9 F (36.6 C) 98 F (36.7 C) 98 F (36.7 C) 97.8 F (36.6 C)  TempSrc: Oral Oral Oral Oral  Resp: 18 18 22 18   Height:      Weight:   71.2 kg (156 lb 15.5 oz)   SpO2: 98% 98% 99% 98%    Last BM Date: 03/29/16  Intake/Output   Yesterday:  05/24 0701 - 05/25 0700 In: 0347 [P.O.:600; I.V.:3000; IV Piggyback:50] Out: 0  This shift:     Physical Exam: General: Pt alert to self.   Abdomen: Soft. Nondistended. Non tender.  No evidence of peritonitis. No incarcerated hernias.  Problem List:   Principal Problem:   Cholecystitis Active Problems:   Senile dementia, uncomplicated   Hyponatremia   Elevated troponin   Acute cholecystitis    Results:   Labs: Results for orders placed or performed during the hospital encounter of 03/28/16 (from the past 48 hour(s))  Surgical pcr screen     Status: None   Collection Time: 03/30/16  9:56 PM  Result Value Ref Range   MRSA, PCR NEGATIVE NEGATIVE   Staphylococcus aureus NEGATIVE NEGATIVE    Comment:        The Xpert SA Assay (FDA approved for NASAL specimens in patients over 75 years of age), is one component of a comprehensive surveillance program.  Test performance has been validated by [facility] for patients greater than or equal to 68 year old. It is not intended to diagnose infection nor to guide or monitor treatment.   CBC     Status: Abnormal   Collection Time: 03/31/16  6:16 AM  Result Value Ref Range   WBC 7.5 4.0 - 10.5 K/uL   RBC 3.52 (L) 3.87 - 5.11 MIL/uL   Hemoglobin 10.4 (L) 12.0 - 15.0 g/dL   HCT 33.5 (L) 36.0 - 46.0 %   MCV 95.2 78.0 - 100.0 fL   MCH 29.5 26.0 - 34.0 pg   MCHC 31.0 30.0 - 36.0 g/dL   RDW 14.0 11.5 - 15.5 %   Platelets 185 150 - 400 K/uL  Comprehensive metabolic panel     Status: Abnormal   Collection Time: 03/31/16  6:16 AM  Result Value Ref Range   Sodium 141 135 - 145 mmol/L   Potassium 3.1 (L) 3.5 - 5.1 mmol/L   Chloride 114 (H) 101 - 111 mmol/L   CO2 23 22 - 32 mmol/L   Glucose, Bld 87 65 - 99 mg/dL   BUN <5 (L) 6 - 20 mg/dL   Creatinine, Ser 0.67 0.44 - 1.00 mg/dL   Calcium 7.5 (L) 8.9 - 10.3 mg/dL   Total Protein 4.7 (L) 6.5 - 8.1 g/dL   Albumin 2.0 (L) 3.5 - 5.0 g/dL   AST 13 (L) 15 - 41 U/L   ALT 10 (L) 14 - 54 U/L   Alkaline Phosphatase 59 38 - 126 U/L  Total Bilirubin 0.5 0.3 - 1.2 mg/dL   GFR calc non Af Amer >60 >60 mL/min   GFR calc Af Amer >60 >60 mL/min    Comment: (NOTE) The eGFR has been calculated using the CKD EPI equation. This calculation has not been validated in all clinical situations. eGFR's persistently <60 mL/min signify possible Chronic Kidney Disease.    Anion gap 4 (L) 5 - 15    Imaging / Studies: No results found.  Medications / Allergies:  Scheduled Meds: . aspirin  81 mg Oral Daily  . cefTRIAXone (ROCEPHIN)  IV  2 g Intravenous Q24H  . celecoxib  200 mg Oral BID  . divalproex  125 mg Oral BID  . donepezil  10 mg Oral QHS  . enoxaparin (LOVENOX) injection  40 mg Subcutaneous Q24H  . memantine  28 mg Oral Daily  . pantoprazole  40 mg Oral Daily  . potassium chloride  40 mEq Oral Once  . rosuvastatin  20 mg Oral q1800   Continuous Infusions: . dextrose 5 % and 0.9 % NaCl with KCl 20 mEq/L     PRN Meds:.haloperidol lactate, HYDROmorphone (DILAUDID) injection, ondansetron **OR** ondansetron (ZOFRAN) IV, senna-docusate  Antibiotics: Anti-infectives    Start     Dose/Rate Route Frequency Ordered Stop    03/29/16 1600  cefTRIAXone (ROCEPHIN) 2 g in dextrose 5 % 50 mL IVPB     2 g 100 mL/hr over 30 Minutes Intravenous Every 24 hours 03/29/16 0624     03/29/16 0415  cefTRIAXone (ROCEPHIN) 1 g in dextrose 5 % 50 mL IVPB     1 g 100 mL/hr over 30 Minutes Intravenous  Once 03/29/16 0403 03/29/16 0514         Assessment/Plan Acute cholecystitis-daughter, POA, has agreed to surgery.  Will therefore proceed with a laparoscopic cholecystectomy with ioc. Surgical risks discussed including infection, bleeding, injury to surrounding structures, open surgery, heart attack, DVT/PE, respiratory compromise.  Verbalizes understanding and wishes to proceed. FEN-NPO.  Hypokalemia-give 40kcl PO since surgery is late afternoon and change IVF with kcl.  ID-rocephin VTE prophylaxis-SCD/lovenox Dispo-surgery later today    Erby Pian, [facility] Pager [phone]) For consults and floor pages call [phone](7A-4:30P)   03/31/2016 8:41 AM

## 2016-03-31 NOTE — Op Note (Signed)
NAMESAREM, SERGIO               ACCOUNT NO.:  [id number]  MEDICAL RECORD NO.:  [id number]  LOCATION:  [facility]                         FACILITY:  [facility]  PHYSICIAN:  Coralie Keens, M.D. DATE OF BIRTH:  Dec 10, 1921  DATE OF PROCEDURE:  03/31/2016 DATE OF DISCHARGE:                              OPERATIVE REPORT   PREOPERATIVE DIAGNOSIS:  Acute cholecystitis.  POSTOPERATIVE DIAGNOSIS:  Acute cholecystitis.  PROCEDURE:  Laparoscopic cholecystectomy.  SURGEON:  Coralie Keens, M.D.  ANESTHESIA:  General with 0.25% Marcaine.  ESTIMATED BLOOD LOSS:  Minimal.  FINDINGS:  The patient was found to have acutely inflamed distended gallbladder.  PROCEDURE IN DETAIL:  The patient was brought to the operating room, identified as Alexis York.  She was placed supine on the operating room table and  general anesthesia was induced.  Her abdomen was then prepped and draped in usual sterile fashion.  I made a small vertical incision above the umbilicus.  I then carried this down to fascia, which was then opened with a scalpel.  A hemostat was used to pass to the peritoneal cavity under direct vision.  I then had to bluntly free some omentum up from the abdominal wall.  I then placed a 0 Vicryl pursestring suture around the fascial opening.  The Hasson port was placed through the opening and insufflation of the abdomen was begun.  I placed a 5-mm port in the patient's epigastrium and 2 more in the right upper quadrant under direct vision.  The gallbladder was found to be acutely distended.  I had to aspirate bile from the gallbladder in order to facilitate grasping the gallbladder.  Once this was done, I was able to grasp and elevate it above the liver bed.  The cystic duct was dissected out and a critical window appeared to be achieved around it. It was clipped three times proximally, once distally, and transected. The cystic artery was likewise identified and clipped proximally, distally,  and transected as well.  The gallbladder was then slowly dissected free from the liver bed with electrocautery.  Once it was freed from the liver bed, it was placed in an endosac and removed through the incision at the umbilicus.  I appeared to achieve hemostasis in the liver bed with the cautery.  I placed a piece of surgical snow in the liver bed as well.  At this point, the abdomen was irrigated with normal saline.  Again, hemostasis appeared to be achieved.  All ports were removed under direct vision.  The abdomen was deflated.  All incisions were then anesthetized with Marcaine.  The 0 Vicryl at the umbilicus was tied in place closing the fascial defect.  All incisions were then closed with 4-0 Monocryl sutures.  Skin glue was then applied.  The patient tolerated the procedure well.  All the counts were correct at the end of the procedure.  The patient was then extubated in the operating room and taken in a stable condition to the recovery room.     Coralie Keens, M.D.     DB/MEDQ  D:  03/31/2016  T:  03/31/2016  Job:  [id number]

## 2016-03-31 NOTE — Care Management Note (Addendum)
Case Management Note  Patient Details  Name: DANYA PIERUCCI MRN: [id number] Date of Birth: 1922/08/03  Subjective/Objective:       Acute Cholecystitis             Action/Plan: Discharge Planning:  03/30/2106 1000 Pt was previously arranged with HH with [facility], [place] to make aware dtr has selected [facility]. Explained the could speak with dtr for reasons why they are wanting to change agencies. [facility] aware of and has accepted new referral. [facility] Liaison spoke to dtr in room about [facility]. Will continue to follow for dc needs.  03/31/2016 NCM spoke to dtr, Clance Boll # [phone]. States she did make [facility] aware that she did not want services with them for [facility]. States she has Rx for lift chair. Provided pt with information on medical supply stores that will help with getting chair. States pt has RW, shower chair, bedside commode and transport chair at home.   PCP - Expected Discharge Date:                  Expected Discharge Plan:  [place]  In-House Referral:  NA  Discharge planning Services  CM Consult  Post Acute Care Choice:  Home Health Choice offered to:  Adult Children  DME Arranged:  N/A DME Agency:  NA  HH Arranged:  RN, PT, OT, Nurse's Aide [place] Agency:  [facility]  Status of Service:  In process, will continue to follow  Medicare Important Message Given:    Date Medicare IM Given:    Medicare IM give by:    Date Additional Medicare IM Given:    Additional Medicare Important Message give by:     If discussed at [place] of Stay Meetings, dates discussed:    Additional Comments:  Erenest Rasher, RN 03/31/2016, 10:05 AM

## 2016-03-31 NOTE — Anesthesia Procedure Notes (Signed)
Procedure Name: Intubation Date/Time: 03/31/2016 12:27 PM Performed by: Lance Coon Pre-anesthesia Checklist: Patient identified, Timeout performed, Emergency Drugs available, Suction available and Patient being monitored Patient Re-evaluated:Patient Re-evaluated prior to inductionOxygen Delivery Method: Circle system utilized Preoxygenation: Pre-oxygenation with 100% oxygen Intubation Type: IV induction Ventilation: Mask ventilation without difficulty Laryngoscope Size: Miller and 2 Grade View: Grade II Tube type: Oral Tube size: 7.0 mm Number of attempts: 1 Airway Equipment and Method: Stylet Secured at: 21 cm Tube secured with: Tape Dental Injury: Teeth and Oropharynx as per pre-operative assessment

## 2016-03-31 NOTE — Care Management Important Message (Signed)
Important Message  Patient Details  Name: Alexis York MRN: [id number] Date of Birth: 02/16/1922   Medicare Important Message Given:  Yes    Loann Quill 03/31/2016, 12:28 PM

## 2016-03-31 NOTE — Op Note (Signed)
LAPAROSCOPIC CHOLECYSTECTOMY  Procedure Note  Alexis York [id number] - 03/31/2016   Pre-op Diagnosis: cholecystitis     Post-op Diagnosis: acute cholecystitis  Procedure(s): LAPAROSCOPIC CHOLECYSTECTOMY  Surgeon(s): Coralie Keens, MD  Anesthesia: General  Staff:  Circulator: Candie Mile, RN Scrub Person: Rolan Bucco Circulator Assistant: Rosanne Sack, RN  Estimated Blood Loss: Minimal               Specimens: sent to path          [facility]   Date: 03/31/2016  Time: 1:23 PM

## 2016-03-31 NOTE — Anesthesia Preprocedure Evaluation (Signed)
Anesthesia Evaluation  Patient identified by MRN, date of birth, ID band Patient awake    Reviewed: Allergy & Precautions, NPO status , Patient's Chart, lab work & pertinent test results  Airway Mallampati: II  TM Distance: >3 FB Neck ROM: Full    Dental   Pulmonary former smoker,    breath sounds clear to auscultation       Cardiovascular negative cardio ROS   Rhythm:Regular Rate:Normal     Neuro/Psych +Dementia    GI/Hepatic Neg liver ROS, GERD  ,  Endo/Other  negative endocrine ROS  Renal/GU negative Renal ROS     Musculoskeletal  (+) Arthritis ,   Abdominal   Peds  Hematology negative hematology ROS (+)   Anesthesia Other Findings   Reproductive/Obstetrics                             Lab Results  Component Value Date   WBC 7.5 03/31/2016   HGB 10.4* 03/31/2016   HCT 33.5* 03/31/2016   MCV 95.2 03/31/2016   PLT 185 03/31/2016   Lab Results  Component Value Date   CREATININE 0.67 03/31/2016   BUN <5* 03/31/2016   NA 141 03/31/2016   K 3.1* 03/31/2016   CL 114* 03/31/2016   CO2 23 03/31/2016    Anesthesia Physical Anesthesia Plan  ASA: III  Anesthesia Plan: General   Post-op Pain Management:    Induction: Intravenous  Airway Management Planned: Oral ETT  Additional Equipment:   Intra-op Plan:   Post-operative Plan: Extubation in OR  Informed Consent: I have reviewed the patients History and Physical, chart, labs and discussed the procedure including the risks, benefits and alternatives for the proposed anesthesia with the patient or authorized representative who has indicated his/her understanding and acceptance.   Dental advisory given  Plan Discussed with: CRNA  Anesthesia Plan Comments:         Anesthesia Quick Evaluation

## 2016-03-31 NOTE — Transfer of Care (Signed)
Immediate Anesthesia Transfer of Care Note  Patient: Alexis York  Procedure(s) Performed: Procedure(s): LAPAROSCOPIC CHOLECYSTECTOMY (N/A)  Patient Location: PACU  Anesthesia Type:General  Level of Consciousness: awake, alert , oriented and patient cooperative  Airway & Oxygen Therapy: Patient Spontanous Breathing and Patient connected to nasal cannula oxygen  Post-op Assessment: Report given to RN and Post -op Vital signs reviewed and stable  Post vital signs: Reviewed and stable  Last Vitals:  Filed Vitals:   03/31/16 0637 03/31/16 0952  BP: 137/65 140/48  Pulse: 72   Temp: 36.6 C 36.7 C  Resp: 18 18    Last Pain: There were no vitals filed for this visit.       Complications: No apparent anesthesia complications

## 2016-03-31 NOTE — Progress Notes (Signed)
PROGRESS NOTE  Alexis York [id number] DOB: [birth year] DOA: 03/28/2016 PCP: Donnajean Lopes, MD  Brief History:  82 year old female with history of dementia, hyperlipidemia, GERD presented with abdominal pain 4 days with decreased oral intake and associated vomiting. CT of the abdomen and pelvis upon presentation revealed distended gallbladder with wall thickening and pericholecystic edema. General surgery was consulted to assist with management. Patient's family was hesitant to pursue surgery. The patient was treated medically with antibiotics with clinical improvement. In the late afternoon on 03/30/2016, the patient's family agreed to laparoscopic cholecystectomy. On 03/31/2016, the patient underwent laparoscopic cholecystectomy.  Assessment/Plan: Acute Cholecystitis -IV fluids.  -[facility] discussed options of cholecystostomy tube but daughter feels she may pull it out.  -03/30/2016--daughter agreed to lap chole -03/31/16--lap chole--Dr. Ninfa Linden -continue ceftriaxone through today, then d/c  Dementia:  -Continue Namenda, Aricept and Depakote. Prn haldol for agitation.  Hyponatremia -Secondary to volume depletion -Improved with IVF  Elevated troponin:  Minimal elevation, asymptmoatic.  Hypokalemia -repleted  -check mag  Arthritis: -Continue celecoxib  Home medications: -Continue PPI, statin, aspirin  Disposition Plan: Home 5/26 or 5/27 if stable and cleared by surgery Family Communication: Daughter updated at beside 5/25  Consultants: [facility]  Code Status: DNR  Subjective: Prior to surgery today, the patient still had some intermittent right upper quadrant pain. She denied any headache, chest pain, shortness breath, fevers, chills, vomiting, diarrhea.  Objective: Filed Vitals:   03/30/16 1800 03/30/16 2043 03/31/16 0637 03/31/16 0952  BP: 122/48 139/54 137/65 140/48  Pulse: 68 58 72   Temp: 98 F (36.7 C) 98 F (36.7 C) 97.8 F (36.6 C) 98  F (36.7 C)  TempSrc: Oral Oral Oral Oral  Resp: 18 22 18 18   Height:      Weight:  71.2 kg (156 lb 15.5 oz)    SpO2: 98% 99% 98% 98%    Intake/Output Summary (Last 24 hours) at 03/31/16 1055 Last data filed at 03/31/16 0900  Gross per 24 hour  Intake   2910 ml  Output      0 ml  Net   2910 ml   Weight change: 0.1 kg (3.5 oz) Exam:   General:  Pt is alert, follows commands appropriately, not in acute distress  HEENT: No icterus, No thrush, No neck mass, Roberta/AT  Cardiovascular: RRR, S1/S2, no rubs, no gallops  Respiratory: CTA bilaterally, no wheezing, no crackles, no rhonchi  Abdomen: Soft/+BS, RUQ tender, non distended, no guarding  Extremities: No edema, No lymphangitis, No petechiae, No rashes, no synovitis   Data Reviewed: I have personally reviewed following labs and imaging studies Basic Metabolic Panel:  Recent Labs Lab 03/28/16 1446 03/29/16 0637 03/31/16 0616  NA 133* 135 141  K 3.7 3.4* 3.1*  CL 102 101 114*  CO2 23 26 23   GLUCOSE 112* 85 87  BUN 17 15 <5*  CREATININE 0.92 0.71 0.67  CALCIUM 9.0 8.1* 7.5*   Liver Function Tests:  Recent Labs Lab 03/28/16 1446 03/29/16 0637 03/31/16 0616  AST 22 15 13*  ALT 17 13* 10*  ALKPHOS 75 63 59  BILITOT 1.4* 1.2 0.5  PROT 6.6 5.7* 4.7*  ALBUMIN 3.0* 2.5* 2.0*    Recent Labs Lab 03/28/16 1446  LIPASE 23   No results for input(s): AMMONIA in the last 168 hours. Coagulation Profile: No results for input(s): INR, PROTIME in the last 168 hours. CBC:  Recent Labs Lab 03/28/16 1446 03/29/16 [id number] 03/31/16 [id number]  WBC 15.4* 10.5 7.5  HGB 13.9 12.1 10.4*  HCT 43.1 37.6 33.5*  MCV 91.9 93.8 95.2  PLT 243 200 185   Cardiac Enzymes:  Recent Labs Lab 03/28/16 2242 03/29/16 0637  TROPONINI 0.04* <0.03   BNP: Invalid input(s): POCBNP CBG: No results for input(s): GLUCAP in the last 168 hours. HbA1C: No results for input(s): HGBA1C in the last 72 hours. Urine analysis:    Component  Value Date/Time   COLORURINE YELLOW 03/29/2016 0121   APPEARANCEUR HAZY* 03/29/2016 0121   LABSPEC 1.025 03/29/2016 0121   PHURINE 6.5 03/29/2016 0121   GLUCOSEU NEGATIVE 03/29/2016 0121   HGBUR NEGATIVE 03/29/2016 0121   BILIRUBINUR NEGATIVE 03/29/2016 0121   KETONESUR 15* 03/29/2016 0121   PROTEINUR NEGATIVE 03/29/2016 0121   UROBILINOGEN 0.2 10/28/2014 2342   NITRITE NEGATIVE 03/29/2016 0121   LEUKOCYTESUR NEGATIVE 03/29/2016 0121   Sepsis Labs: @LABRCNTIP (procalcitonin:4,lacticidven:4) ) Recent Results (from the past 240 hour(s))  Surgical pcr screen     Status: None   Collection Time: 03/30/16  9:56 PM  Result Value Ref Range Status   MRSA, PCR NEGATIVE NEGATIVE Final   Staphylococcus aureus NEGATIVE NEGATIVE Final    Comment:        The Xpert SA Assay (FDA approved for NASAL specimens in patients over 90 years of age), is one component of a comprehensive surveillance program.  Test performance has been validated by [facility] for patients greater than or equal to 47 year old. It is not intended to diagnose infection nor to guide or monitor treatment.      Scheduled Meds: . aspirin  81 mg Oral Daily  . cefTRIAXone (ROCEPHIN)  IV  2 g Intravenous Q24H  . celecoxib  200 mg Oral BID  . divalproex  125 mg Oral BID  . donepezil  10 mg Oral QHS  . enoxaparin (LOVENOX) injection  40 mg Subcutaneous Q24H  . memantine  28 mg Oral Daily  . pantoprazole  40 mg Oral Daily  . rosuvastatin  20 mg Oral q1800   Continuous Infusions: . dextrose 5 % and 0.9 % NaCl with KCl 20 mEq/L 75 mL/hr at 03/31/16 1026    Procedures/Studies: Ct Chest W Contrast  03/29/2016  CLINICAL DATA:  82 year old female with upper back and abdominal pain for 3 days. EXAM: CT CHEST, ABDOMEN, AND PELVIS WITH CONTRAST TECHNIQUE: Multidetector CT imaging of the chest, abdomen and pelvis was performed following the standard protocol during bolus administration of intravenous contrast. CONTRAST:  1  ISOVUE-300 IOPAMIDOL (ISOVUE-300) INJECTION 61% COMPARISON:  None. FINDINGS: CT CHEST FINDINGS Mediastinum/Lymph Nodes: Tortuous atherosclerotic thoracic aorta without aneurysm or dissection. Dense coronary artery calcifications or stents. Heart size is normal. No pericardial effusion. No adenopathy. Lungs/Pleura: No pulmonary mass, infiltrate, or effusion. Tiny 3 mm subpleural nodule left upper lobe series 3, image 55. Scattered atelectasis in both lower lobes and right middle lobe. Musculoskeletal: Multiple compression deformities in the thoracic spine. T10 and T12 compression deformities with vertebral augmentation. T7 and T8 compression deformities without augmentation. Acuity of the T7 and T8 fracture is uncertain. Exaggerated thoracic kyphosis and multilevel degenerative change. CT ABDOMEN PELVIS FINDINGS Hepatobiliary: Distended gallbladder with gallbladder wall thickening and pericholecystic edema. Calcified gallstone in the gallbladder neck. Common bile duct measures 10 mm, likely normal for age. No focal hepatic lesion. Pancreas: No mass, inflammatory changes, or other significant abnormality. Spleen: Within normal limits in size and appearance. Adrenals/Urinary Tract: No masses identified. No evidence of hydronephrosis. Multiple bilateral parapelvic cysts in  both kidneys. Cortical cyst in the upper left kidney. Stomach/Bowel: No evidence of obstruction, inflammatory process, or abnormal fluid collections. Moderate stool burden. Scattered distal colonic diverticular without diverticulitis. Vascular/Lymphatic: No pathologically enlarged lymph nodes. No evidence of abdominal aortic aneurysm. Dense atherosclerosis of the abdominal aorta and its branches, no aneurysm. Reproductive: Uterus is surgically absent. No adnexal mass. Streak artifact obscures detailed evaluation. Other: None. Musculoskeletal: Multilevel degenerative change throughout the lumbar spine, no compression deformity or acute abnormality. An  bilateral hip arthroplasties. IMPRESSION: 1. CT findings consistent with acute cholecystitis. No biliary dilatation. 2. Multiple compression deformities throughout the thoracic spine, chronic at T10 and T12 with vertebral augmentation. T7 and T8 compression fractures of uncertain acuity. 3. Diffuse atherosclerosis including coronary artery calcifications. 4. Tiny 3 mm subpleural nodule in the left upper lobe, this is likely infectious or inflammatory. No follow-up needed if patient is low-risk. Non-contrast chest CT can be considered in 12 months if patient is high-risk. This recommendation follows the consensus statement: Guidelines for Management of Incidental Pulmonary Nodules Detected on CT Images:From the Fleischner Society 2017; published online before print (10.1148/radiol.IJ:[id number]). Electronically Signed   By: Jeb Levering M.D.   On: 03/29/2016 02:56   Ct Abdomen Pelvis W Contrast  03/29/2016  CLINICAL DATA:  82 year old female with upper back and abdominal pain for 3 days. EXAM: CT CHEST, ABDOMEN, AND PELVIS WITH CONTRAST TECHNIQUE: Multidetector CT imaging of the chest, abdomen and pelvis was performed following the standard protocol during bolus administration of intravenous contrast. CONTRAST:  1 ISOVUE-300 IOPAMIDOL (ISOVUE-300) INJECTION 61% COMPARISON:  None. FINDINGS: CT CHEST FINDINGS Mediastinum/Lymph Nodes: Tortuous atherosclerotic thoracic aorta without aneurysm or dissection. Dense coronary artery calcifications or stents. Heart size is normal. No pericardial effusion. No adenopathy. Lungs/Pleura: No pulmonary mass, infiltrate, or effusion. Tiny 3 mm subpleural nodule left upper lobe series 3, image 55. Scattered atelectasis in both lower lobes and right middle lobe. Musculoskeletal: Multiple compression deformities in the thoracic spine. T10 and T12 compression deformities with vertebral augmentation. T7 and T8 compression deformities without augmentation. Acuity of the T7 and T8  fracture is uncertain. Exaggerated thoracic kyphosis and multilevel degenerative change. CT ABDOMEN PELVIS FINDINGS Hepatobiliary: Distended gallbladder with gallbladder wall thickening and pericholecystic edema. Calcified gallstone in the gallbladder neck. Common bile duct measures 10 mm, likely normal for age. No focal hepatic lesion. Pancreas: No mass, inflammatory changes, or other significant abnormality. Spleen: Within normal limits in size and appearance. Adrenals/Urinary Tract: No masses identified. No evidence of hydronephrosis. Multiple bilateral parapelvic cysts in both kidneys. Cortical cyst in the upper left kidney. Stomach/Bowel: No evidence of obstruction, inflammatory process, or abnormal fluid collections. Moderate stool burden. Scattered distal colonic diverticular without diverticulitis. Vascular/Lymphatic: No pathologically enlarged lymph nodes. No evidence of abdominal aortic aneurysm. Dense atherosclerosis of the abdominal aorta and its branches, no aneurysm. Reproductive: Uterus is surgically absent. No adnexal mass. Streak artifact obscures detailed evaluation. Other: None. Musculoskeletal: Multilevel degenerative change throughout the lumbar spine, no compression deformity or acute abnormality. An bilateral hip arthroplasties. IMPRESSION: 1. CT findings consistent with acute cholecystitis. No biliary dilatation. 2. Multiple compression deformities throughout the thoracic spine, chronic at T10 and T12 with vertebral augmentation. T7 and T8 compression fractures of uncertain acuity. 3. Diffuse atherosclerosis including coronary artery calcifications. 4. Tiny 3 mm subpleural nodule in the left upper lobe, this is likely infectious or inflammatory. No follow-up needed if patient is low-risk. Non-contrast chest CT can be considered in 12 months if patient is high-risk. This recommendation follows  the consensus statement: Guidelines for Management of Incidental Pulmonary Nodules Detected on CT  Images:From the Fleischner Society 2017; published online before print (10.1148/[email or web address]:[id number]). Electronically Signed   By: Jeb Levering M.D.   On: 03/29/2016 02:56    Yehonatan Grandison, DO  [facility] Pager [phone]  If 7PM-7AM, please contact night-coverage [email or web address] Password [email or web address] 03/31/2016, 10:55 AM   LOS: 2 days

## 2016-03-31 NOTE — Anesthesia Postprocedure Evaluation (Signed)
Anesthesia Post Note  Patient: Alexis York  Procedure(s) Performed: Procedure(s) (LRB): LAPAROSCOPIC CHOLECYSTECTOMY (N/A)  Patient location during evaluation: PACU Anesthesia Type: General Level of consciousness: awake and alert Pain management: pain level controlled Vital Signs Assessment: post-procedure vital signs reviewed and stable Respiratory status: spontaneous breathing, nonlabored ventilation, respiratory function stable and patient connected to nasal cannula oxygen Cardiovascular status: blood pressure returned to baseline and stable Postop Assessment: no signs of nausea or vomiting Anesthetic complications: no    Last Vitals:  Filed Vitals:   03/31/16 1419 03/31/16 1430  BP: 164/77 160/66  Pulse:  73  Temp:    Resp: 17 18    Last Pain:  Filed Vitals:   03/31/16 1438  PainSc: 0-No pain                 Tiajuana Amass

## 2016-04-01 ENCOUNTER — Encounter (HOSPITAL_COMMUNITY): Payer: Self-pay | Admitting: Surgery

## 2016-04-01 LAB — CBC
HEMATOCRIT: 37.6 % (ref 36.0–46.0)
HEMOGLOBIN: 11.9 g/dL — AB (ref 12.0–15.0)
MCH: 29.2 pg (ref 26.0–34.0)
MCHC: 31.6 g/dL (ref 30.0–36.0)
MCV: 92.4 fL (ref 78.0–100.0)
Platelets: 237 10*3/uL (ref 150–400)
RBC: 4.07 MIL/uL (ref 3.87–5.11)
RDW: 13.8 % (ref 11.5–15.5)
WBC: 8.1 10*3/uL (ref 4.0–10.5)

## 2016-04-01 LAB — COMPREHENSIVE METABOLIC PANEL
ALBUMIN: 2.2 g/dL — AB (ref 3.5–5.0)
ALT: 21 U/L (ref 14–54)
ANION GAP: 7 (ref 5–15)
AST: 41 U/L (ref 15–41)
Alkaline Phosphatase: 66 U/L (ref 38–126)
BILIRUBIN TOTAL: 0.4 mg/dL (ref 0.3–1.2)
BUN: <5 mg/dL — ABNORMAL LOW (ref 6–20)
CALCIUM: 7.6 mg/dL — AB (ref 8.9–10.3)
CO2: 21 mmol/L — AB (ref 22–32)
Chloride: 111 mmol/L (ref 101–111)
Creatinine, Ser: 0.62 mg/dL (ref 0.44–1.00)
GFR calc non Af Amer: >60 mL/min (ref >60)
GLUCOSE: 126 mg/dL — AB (ref 65–99)
POTASSIUM: 4 mmol/L (ref 3.5–5.1)
SODIUM: 139 mmol/L (ref 135–145)
TOTAL PROTEIN: 5.4 g/dL — AB (ref 6.5–8.1)

## 2016-04-01 MED ORDER — BISACODYL 5 MG PO TBEC
10.0000 mg | DELAYED_RELEASE_TABLET | Freq: Once | ORAL | Status: AC
  Administered 2016-04-01: 10 mg via ORAL
  Filled 2016-04-01: qty 2

## 2016-04-01 MED ORDER — ACETAMINOPHEN 325 MG PO TABS
325.0000 mg | ORAL_TABLET | Freq: Four times a day (QID) | ORAL | Status: DC | PRN
  Administered 2016-04-03: 650 mg via ORAL
  Filled 2016-04-01: qty 2

## 2016-04-01 MED ORDER — FENTANYL CITRATE (PF) 100 MCG/2ML IJ SOLN: 25.0000 ug | INTRAMUSCULAR | Status: DC | PRN

## 2016-04-01 MED ORDER — DOCUSATE SODIUM 100 MG PO CAPS
100.0000 mg | ORAL_CAPSULE | Freq: Two times a day (BID) | ORAL | Status: DC
  Administered 2016-04-01 – 2016-04-04 (×4): 100 mg via ORAL
  Filled 2016-04-01 (×4): qty 1

## 2016-04-01 NOTE — Progress Notes (Signed)
PROGRESS NOTE  Alexis York [id number] DOB: 01-27-1922 DOA: 03/28/2016 PCP: Donnajean Lopes, MD  Brief History:  82 year old female with history of dementia, hyperlipidemia, GERD presented with abdominal pain 4 days with decreased oral intake and associated vomiting. CT of the abdomen and pelvis upon presentation revealed distended gallbladder with wall thickening and pericholecystic edema. General surgery was consulted to assist with management. Patient's family was hesitant to pursue surgery. The patient was treated medically with antibiotics with clinical improvement. In the late afternoon on 03/30/2016, the patient's family agreed to laparoscopic cholecystectomy. On 03/31/2016, the patient underwent laparoscopic cholecystectomy.  Assessment/Plan: Acute Cholecystitis -IV fluids until eating better -03/30/2016--daughter agreed to lap chole -03/31/16--lap chole--Dr. Ninfa Linden -advance diet  Dementia:  -Continue Namenda, Aricept and Depakote. Prn haldol for agitation.  Hyponatremia -Secondary to volume depletion -Improved with IVF  Elevated troponin:  Minimal elevation, asymptmoatic.  Hypokalemia -repleted   Arthritis: -Continue celecoxib  Constipation -colace and bisacodyl  Deconditioning -PT eval  Disposition Plan: Home vs SNF 5/27 if stable and cleared by surgery Family Communication: Daughter updated at beside 5/26  Consultants: CCS  Code Status: DNR  Subjective: Patient is pleasantly confused but denies any fevers, chills, chest pain, shortness breath, vomiting, abdominal pain. Denies any headaches or neck pain.  Objective: Filed Vitals:   03/31/16 1935 04/01/16 0452 04/01/16 1100 04/01/16 1649  BP: 131/56 144/65 138/55 125/64  Pulse: 75 77 60 93  Temp: 97.4 F (36.3 C) 98.1 F (36.7 C) 97.5 F (36.4 C) 97.8 F (36.6 C)  TempSrc: Oral  Oral Oral  Resp: 14 16 16 16   Height:      Weight: 75.5 kg (166 lb 7.2 oz)     SpO2:  100% 99% 99% 99%    Intake/Output Summary (Last 24 hours) at 04/01/16 1818 Last data filed at 04/01/16 1300  Gross per 24 hour  Intake 158.75 ml  Output    300 ml  Net -141.25 ml   Weight change: 4.3 kg (9 lb 7.7 oz) Exam:   General:  Pt is alert, follows commands appropriately, not in acute distress  HEENT: No icterus, No thrush, No neck mass, Hydro/AT  Cardiovascular: RRR, S1/S2, no rubs, no gallops  Respiratory: Bibasilar crackles. No wheezing. Good air movement.  Abdomen: Soft/+BS, non tender, non distended, no guarding  Extremities: trace LE edema, No lymphangitis, No petechiae, No rashes, no synovitis   Data Reviewed: I have personally reviewed following labs and imaging studies Basic Metabolic Panel:  Recent Labs Lab 03/28/16 1446 03/29/16 0637 03/31/16 0616 04/01/16 0510  NA 133* 135 141 139  K 3.7 3.4* 3.1* 4.0  CL 102 101 114* 111  CO2 23 26 23  21*  GLUCOSE 112* 85 87 126*  BUN 17 15 <5* <5*  CREATININE 0.92 0.71 0.67 0.62  CALCIUM 9.0 8.1* 7.5* 7.6*   Liver Function Tests:  Recent Labs Lab 03/28/16 1446 03/29/16 0637 03/31/16 0616 04/01/16 0510  AST 22 15 13* 41  ALT 17 13* 10* 21  ALKPHOS 75 63 59 66  BILITOT 1.4* 1.2 0.5 0.4  PROT 6.6 5.7* 4.7* 5.4*  ALBUMIN 3.0* 2.5* 2.0* 2.2*    Recent Labs Lab 03/28/16 1446  LIPASE 23   No results for input(s): AMMONIA in the last 168 hours. Coagulation Profile: No results for input(s): INR, PROTIME in the last 168 hours. CBC:  Recent Labs Lab 03/28/16 1446 03/29/16 0637 03/31/16 0616 04/01/16 0510  WBC 15.4* 10.5 7.5  8.1  HGB 13.9 12.1 10.4* 11.9*  HCT 43.1 37.6 33.5* 37.6  MCV 91.9 93.8 95.2 92.4  PLT 243 200 185 237   Cardiac Enzymes:  Recent Labs Lab 03/28/16 2242 03/29/16 0637  TROPONINI 0.04* <0.03   BNP: Invalid input(s): POCBNP CBG: No results for input(s): GLUCAP in the last 168 hours. HbA1C: No results for input(s): HGBA1C in the last 72 hours. Urine analysis:      Component Value Date/Time   COLORURINE YELLOW 03/29/2016 0121   APPEARANCEUR HAZY* 03/29/2016 0121   LABSPEC 1.025 03/29/2016 0121   PHURINE 6.5 03/29/2016 0121   GLUCOSEU NEGATIVE 03/29/2016 0121   HGBUR NEGATIVE 03/29/2016 0121   BILIRUBINUR NEGATIVE 03/29/2016 0121   KETONESUR 15* 03/29/2016 0121   PROTEINUR NEGATIVE 03/29/2016 0121   UROBILINOGEN 0.2 10/28/2014 2342   NITRITE NEGATIVE 03/29/2016 0121   LEUKOCYTESUR NEGATIVE 03/29/2016 0121   Sepsis Labs: @LABRCNTIP (procalcitonin:4,lacticidven:4) ) Recent Results (from the past 240 hour(s))  Surgical pcr screen     Status: None   Collection Time: 03/30/16  9:56 PM  Result Value Ref Range Status   MRSA, PCR NEGATIVE NEGATIVE Final   Staphylococcus aureus NEGATIVE NEGATIVE Final    Comment:        The Xpert SA Assay (FDA approved for NASAL specimens in patients over 21 years of age), is one component of a comprehensive surveillance program.  Test performance has been validated by [facility] for patients greater than or equal to 60 year old. It is not intended to diagnose infection nor to guide or monitor treatment.      Scheduled Meds: . aspirin  81 mg Oral Daily  . celecoxib  200 mg Oral BID  . divalproex  125 mg Oral BID  . docusate sodium  100 mg Oral BID  . donepezil  10 mg Oral QHS  . enoxaparin (LOVENOX) injection  40 mg Subcutaneous Q24H  . memantine  28 mg Oral Daily  . pantoprazole  40 mg Oral Daily  . rosuvastatin  20 mg Oral q1800   Continuous Infusions: . dextrose 5 % and 0.9 % NaCl with KCl 20 mEq/L 75 mL/hr at 04/01/16 0957  . lactated ringers Stopped (03/31/16 1330)    Procedures/Studies: Ct Chest W Contrast  03/29/2016  CLINICAL DATA:  82 year old female with upper back and abdominal pain for 3 days. EXAM: CT CHEST, ABDOMEN, AND PELVIS WITH CONTRAST TECHNIQUE: Multidetector CT imaging of the chest, abdomen and pelvis was performed following the standard protocol during bolus administration  of intravenous contrast. CONTRAST:  1 ISOVUE-300 IOPAMIDOL (ISOVUE-300) INJECTION 61% COMPARISON:  None. FINDINGS: CT CHEST FINDINGS Mediastinum/Lymph Nodes: Tortuous atherosclerotic thoracic aorta without aneurysm or dissection. Dense coronary artery calcifications or stents. Heart size is normal. No pericardial effusion. No adenopathy. Lungs/Pleura: No pulmonary mass, infiltrate, or effusion. Tiny 3 mm subpleural nodule left upper lobe series 3, image 55. Scattered atelectasis in both lower lobes and right middle lobe. Musculoskeletal: Multiple compression deformities in the thoracic spine. T10 and T12 compression deformities with vertebral augmentation. T7 and T8 compression deformities without augmentation. Acuity of the T7 and T8 fracture is uncertain. Exaggerated thoracic kyphosis and multilevel degenerative change. CT ABDOMEN PELVIS FINDINGS Hepatobiliary: Distended gallbladder with gallbladder wall thickening and pericholecystic edema. Calcified gallstone in the gallbladder neck. Common bile duct measures 10 mm, likely normal for age. No focal hepatic lesion. Pancreas: No mass, inflammatory changes, or other significant abnormality. Spleen: Within normal limits in size and appearance. Adrenals/Urinary Tract: No masses identified. No evidence  of hydronephrosis. Multiple bilateral parapelvic cysts in both kidneys. Cortical cyst in the upper left kidney. Stomach/Bowel: No evidence of obstruction, inflammatory process, or abnormal fluid collections. Moderate stool burden. Scattered distal colonic diverticular without diverticulitis. Vascular/Lymphatic: No pathologically enlarged lymph nodes. No evidence of abdominal aortic aneurysm. Dense atherosclerosis of the abdominal aorta and its branches, no aneurysm. Reproductive: Uterus is surgically absent. No adnexal mass. Streak artifact obscures detailed evaluation. Other: None. Musculoskeletal: Multilevel degenerative change throughout the lumbar spine, no  compression deformity or acute abnormality. An bilateral hip arthroplasties. IMPRESSION: 1. CT findings consistent with acute cholecystitis. No biliary dilatation. 2. Multiple compression deformities throughout the thoracic spine, chronic at T10 and T12 with vertebral augmentation. T7 and T8 compression fractures of uncertain acuity. 3. Diffuse atherosclerosis including coronary artery calcifications. 4. Tiny 3 mm subpleural nodule in the left upper lobe, this is likely infectious or inflammatory. No follow-up needed if patient is low-risk. Non-contrast chest CT can be considered in 12 months if patient is high-risk. This recommendation follows the consensus statement: Guidelines for Management of Incidental Pulmonary Nodules Detected on CT Images:From the Fleischner Society 2017; published online before print (10.1148/radiol.IJ:2314499). Electronically Signed   By: Jeb Levering M.D.   On: 03/29/2016 02:56   Ct Abdomen Pelvis W Contrast  03/29/2016  CLINICAL DATA:  82 year old female with upper back and abdominal pain for 3 days. EXAM: CT CHEST, ABDOMEN, AND PELVIS WITH CONTRAST TECHNIQUE: Multidetector CT imaging of the chest, abdomen and pelvis was performed following the standard protocol during bolus administration of intravenous contrast. CONTRAST:  1 ISOVUE-300 IOPAMIDOL (ISOVUE-300) INJECTION 61% COMPARISON:  None. FINDINGS: CT CHEST FINDINGS Mediastinum/Lymph Nodes: Tortuous atherosclerotic thoracic aorta without aneurysm or dissection. Dense coronary artery calcifications or stents. Heart size is normal. No pericardial effusion. No adenopathy. Lungs/Pleura: No pulmonary mass, infiltrate, or effusion. Tiny 3 mm subpleural nodule left upper lobe series 3, image 55. Scattered atelectasis in both lower lobes and right middle lobe. Musculoskeletal: Multiple compression deformities in the thoracic spine. T10 and T12 compression deformities with vertebral augmentation. T7 and T8 compression deformities  without augmentation. Acuity of the T7 and T8 fracture is uncertain. Exaggerated thoracic kyphosis and multilevel degenerative change. CT ABDOMEN PELVIS FINDINGS Hepatobiliary: Distended gallbladder with gallbladder wall thickening and pericholecystic edema. Calcified gallstone in the gallbladder neck. Common bile duct measures 10 mm, likely normal for age. No focal hepatic lesion. Pancreas: No mass, inflammatory changes, or other significant abnormality. Spleen: Within normal limits in size and appearance. Adrenals/Urinary Tract: No masses identified. No evidence of hydronephrosis. Multiple bilateral parapelvic cysts in both kidneys. Cortical cyst in the upper left kidney. Stomach/Bowel: No evidence of obstruction, inflammatory process, or abnormal fluid collections. Moderate stool burden. Scattered distal colonic diverticular without diverticulitis. Vascular/Lymphatic: No pathologically enlarged lymph nodes. No evidence of abdominal aortic aneurysm. Dense atherosclerosis of the abdominal aorta and its branches, no aneurysm. Reproductive: Uterus is surgically absent. No adnexal mass. Streak artifact obscures detailed evaluation. Other: None. Musculoskeletal: Multilevel degenerative change throughout the lumbar spine, no compression deformity or acute abnormality. An bilateral hip arthroplasties. IMPRESSION: 1. CT findings consistent with acute cholecystitis. No biliary dilatation. 2. Multiple compression deformities throughout the thoracic spine, chronic at T10 and T12 with vertebral augmentation. T7 and T8 compression fractures of uncertain acuity. 3. Diffuse atherosclerosis including coronary artery calcifications. 4. Tiny 3 mm subpleural nodule in the left upper lobe, this is likely infectious or inflammatory. No follow-up needed if patient is low-risk. Non-contrast chest CT can be considered in 12 months  if patient is high-risk. This recommendation follows the consensus statement: Guidelines for Management of  Incidental Pulmonary Nodules Detected on CT Images:From the Fleischner Society 2017; published online before print (10.1148/radiol.IJ:2314499). Electronically Signed   By: Jeb Levering M.D.   On: 03/29/2016 02:56    Alexis Rossetti, DO  [facility] Pager [phone]  If 7PM-7AM, please contact night-coverage [email or web address] Password [email or web address] 04/01/2016, 6:18 PM   LOS: 3 days

## 2016-04-01 NOTE — Care Management Important Message (Signed)
Important Message  Patient Details  Name: Alexis York MRN: [id number] Date of Birth: Aug 21, 1922   Medicare Important Message Given:  Yes    Onofrio Klemp, Rory Percy, RN 04/01/2016, 4:04 PM

## 2016-04-01 NOTE — Progress Notes (Signed)
Patient ID: Alexis York, female   DOB: Jul 22, 1922, 82 y.o.   MRN: [id number]     [address] [place], [place] [address]    Phone: [phone] FAX: [phone]     Subjective: Pt sitting up in a chair. Drowsy.  No n/v.  Did not eat liquids this am Spat up phlegm, no bile/food. WBC normal.  k normal.  LFTs normalized.    Objective:  Vital signs:  Filed Vitals:   03/31/16 1430 03/31/16 1736 03/31/16 1935 04/01/16 0452  BP: 160/66 145/70 131/56 144/65  Pulse: 73 75 75 77  Temp:  98 F (36.7 C) 97.4 F (36.3 C) 98.1 F (36.7 C)  TempSrc:  Oral Oral   Resp: 18 18 14 16   Height:      Weight:   75.5 kg (166 lb 7.2 oz)   SpO2: 98% 98% 100% 99%    Last BM Date: 03/31/16  Intake/Output   Yesterday:  05/25 0701 - 05/26 0700 In: 1167.5 [I.V.:1167.5] Out: 20 [Blood:20] This shift: I/O last 3 completed shifts: In: 2907.5 [P.O.:240; I.V.:2667.5] Out: 20 [Blood:20]    Physical Exam: General: Pt awake/alert and in no acute distress Abdomen: Soft.  Nondistended. Non tender.  Incisions are c/d/i.  No evidence of peritonitis.  No incarcerated hernias.    Problem List:   Principal Problem:   Cholecystitis Active Problems:   Senile dementia, uncomplicated   Hyponatremia   Elevated troponin   Acute cholecystitis    Results:   Labs: Results for orders placed or performed during the hospital encounter of 03/28/16 (from the past 48 hour(s))  Surgical pcr screen     Status: None   Collection Time: 03/30/16  9:56 PM  Result Value Ref Range   MRSA, PCR NEGATIVE NEGATIVE   Staphylococcus aureus NEGATIVE NEGATIVE    Comment:        The Xpert SA Assay (FDA approved for NASAL specimens in patients over 47 years of age), is one component of a comprehensive surveillance program.  Test performance has been validated by [facility] for patients greater than or equal to 26 year old. It is not intended to  diagnose infection nor to guide or monitor treatment.   CBC     Status: Abnormal   Collection Time: 03/31/16  6:16 AM  Result Value Ref Range   WBC 7.5 4.0 - 10.5 K/uL   RBC 3.52 (L) 3.87 - 5.11 MIL/uL   Hemoglobin 10.4 (L) 12.0 - 15.0 g/dL   HCT 33.5 (L) 36.0 - 46.0 %   MCV 95.2 78.0 - 100.0 fL   MCH 29.5 26.0 - 34.0 pg   MCHC 31.0 30.0 - 36.0 g/dL   RDW 14.0 11.5 - 15.5 %   Platelets 185 150 - 400 K/uL  Comprehensive metabolic panel     Status: Abnormal   Collection Time: 03/31/16  6:16 AM  Result Value Ref Range   Sodium 141 135 - 145 mmol/L   Potassium 3.1 (L) 3.5 - 5.1 mmol/L   Chloride 114 (H) 101 - 111 mmol/L   CO2 23 22 - 32 mmol/L   Glucose, Bld 87 65 - 99 mg/dL   BUN <5 (L) 6 - 20 mg/dL   Creatinine, Ser 0.67 0.44 - 1.00 mg/dL   Calcium 7.5 (L) 8.9 - 10.3 mg/dL   Total Protein 4.7 (L) 6.5 - 8.1 g/dL   Albumin 2.0 (L) 3.5 - 5.0 g/dL  AST 13 (L) 15 - 41 U/L   ALT 10 (L) 14 - 54 U/L   Alkaline Phosphatase 59 38 - 126 U/L   Total Bilirubin 0.5 0.3 - 1.2 mg/dL   GFR calc non Af Amer >60 >60 mL/min   GFR calc Af Amer >60 >60 mL/min    Comment: (NOTE) The eGFR has been calculated using the CKD EPI equation. This calculation has not been validated in all clinical situations. eGFR's persistently <60 mL/min signify possible Chronic Kidney Disease.    Anion gap 4 (L) 5 - 15  Comprehensive metabolic panel     Status: Abnormal   Collection Time: 04/01/16  5:10 AM  Result Value Ref Range   Sodium 139 135 - 145 mmol/L   Potassium 4.0 3.5 - 5.1 mmol/L   Chloride 111 101 - 111 mmol/L   CO2 21 (L) 22 - 32 mmol/L   Glucose, Bld 126 (H) 65 - 99 mg/dL   BUN <5 (L) 6 - 20 mg/dL   Creatinine, Ser 0.62 0.44 - 1.00 mg/dL   Calcium 7.6 (L) 8.9 - 10.3 mg/dL   Total Protein 5.4 (L) 6.5 - 8.1 g/dL   Albumin 2.2 (L) 3.5 - 5.0 g/dL   AST 41 15 - 41 U/L   ALT 21 14 - 54 U/L   Alkaline Phosphatase 66 38 - 126 U/L   Total Bilirubin 0.4 0.3 - 1.2 mg/dL   GFR calc non Af Amer >60 >60  mL/min   GFR calc Af Amer >60 >60 mL/min    Comment: (NOTE) The eGFR has been calculated using the CKD EPI equation. This calculation has not been validated in all clinical situations. eGFR's persistently <60 mL/min signify possible Chronic Kidney Disease.    Anion gap 7 5 - 15  CBC     Status: Abnormal   Collection Time: 04/01/16  5:10 AM  Result Value Ref Range   WBC 8.1 4.0 - 10.5 K/uL   RBC 4.07 3.87 - 5.11 MIL/uL   Hemoglobin 11.9 (L) 12.0 - 15.0 g/dL   HCT 37.6 36.0 - 46.0 %   MCV 92.4 78.0 - 100.0 fL   MCH 29.2 26.0 - 34.0 pg   MCHC 31.6 30.0 - 36.0 g/dL   RDW 13.8 11.5 - 15.5 %   Platelets 237 150 - 400 K/uL    Imaging / Studies: No results found.  Medications / Allergies:  Scheduled Meds: . aspirin  81 mg Oral Daily  . cefTRIAXone (ROCEPHIN)  IV  2 g Intravenous Q24H  . celecoxib  200 mg Oral BID  . divalproex  125 mg Oral BID  . donepezil  10 mg Oral QHS  . enoxaparin (LOVENOX) injection  40 mg Subcutaneous Q24H  . memantine  28 mg Oral Daily  . pantoprazole  40 mg Oral Daily  . rosuvastatin  20 mg Oral q1800   Continuous Infusions: . dextrose 5 % and 0.9 % NaCl with KCl 20 mEq/L 75 mL/hr at 03/31/16 1506  . lactated ringers Stopped (03/31/16 1330)   PRN Meds:.fentaNYL (SUBLIMAZE) injection, haloperidol lactate, ondansetron **OR** ondansetron (ZOFRAN) IV, senna-docusate, traMADol  Antibiotics: Anti-infectives    Start     Dose/Rate Route Frequency Ordered Stop   03/29/16 1600  cefTRIAXone (ROCEPHIN) 2 g in dextrose 5 % 50 mL IVPB     2 g 100 mL/hr over 30 Minutes Intravenous Every 24 hours 03/29/16 0624     03/29/16 0415  cefTRIAXone (ROCEPHIN) 1 g in dextrose 5 % 50  mL IVPB     1 g 100 mL/hr over 30 Minutes Intravenous  Once 03/29/16 0403 03/29/16 0514        Assessment/Plan Acute cholecystitis POD#! Laparoscopic cholecystectomy---Dr. Ninfa Linden -advance diet as tolerated, DC dilaudid, encourage PO pain meds and watch for sedation, add tylenol,  add IS and flutter valve, PT eval, mobilize  FEN-appetite is poor, encourage intake.  ID-DC rocephin VTE prophylaxis-SCD/lovenox Dispo-PT eval.  Not surgically ready    Erby Pian, ANP-BC [facility] Pager [phone](7A-4:30P) For consults and floor pages call [phone](7A-4:30P)  04/01/2016 9:58 AM

## 2016-04-01 NOTE — Progress Notes (Signed)
Respiratory care note- Flutter valve brought to patient's room as per MD order.  Family at bedside.  Instructed pt/family on use of flutter valve.  Device left with family.

## 2016-04-02 LAB — BASIC METABOLIC PANEL
Anion gap: 6 (ref 5–15)
CALCIUM: 7.7 mg/dL — AB (ref 8.9–10.3)
CO2: 23 mmol/L (ref 22–32)
CREATININE: 0.64 mg/dL (ref 0.44–1.00)
Chloride: 110 mmol/L (ref 101–111)
GFR calc non Af Amer: >60 mL/min (ref >60)
GLUCOSE: 99 mg/dL (ref 65–99)
Potassium: 3.7 mmol/L (ref 3.5–5.1)
Sodium: 139 mmol/L (ref 135–145)

## 2016-04-02 MED ORDER — TRAMADOL HCL 50 MG PO TABS: 50.0000 mg | ORAL_TABLET | Freq: Four times a day (QID) | ORAL | Status: AC | PRN

## 2016-04-02 NOTE — Progress Notes (Signed)
2 Days Post-Op  Subjective: Eating better.  Objective: Vital signs in last 24 hours: Temp:  [97.5 F (36.4 C)-97.8 F (36.6 C)] 97.7 F (36.5 C) (05/27 0513) Pulse Rate:  [60-93] 80 (05/27 0513) Resp:  [16-20] 20 (05/27 0513) BP: (121-138)/(44-64) 127/58 mmHg (05/27 0513) SpO2:  [98 %-100 %] 100 % (05/27 0513) Weight:  [74.3 kg (163 lb 12.8 oz)] 74.3 kg (163 lb 12.8 oz) (05/27 0513) Last BM Date: 03/31/16  Intake/Output from previous day: 05/26 0701 - 05/27 0700 In: 1428.8 [P.O.:370; I.V.:1058.8] Out: 300 [Urine:300] Intake/Output this shift:    PE: General- In NAD Abdomen-soft, incisions clean and intact  Lab Results:   Recent Labs  03/31/16 0616 04/01/16 0510  WBC 7.5 8.1  HGB 10.4* 11.9*  HCT 33.5* 37.6  PLT 185 237   BMET  Recent Labs  04/01/16 0510 04/02/16 0537  NA 139 139  K 4.0 3.7  CL 111 110  CO2 21* 23  GLUCOSE 126* 99  BUN <5* <5*  CREATININE 0.62 0.64  CALCIUM 7.6* 7.7*   PT/INR No results for input(s): LABPROT, INR in the last 72 hours. Comprehensive Metabolic Panel:    Component Value Date/Time   NA 139 04/02/2016 0537   NA 139 04/01/2016 0510   K 3.7 04/02/2016 0537   K 4.0 04/01/2016 0510   CL 110 04/02/2016 0537   CL 111 04/01/2016 0510   CO2 23 04/02/2016 0537   CO2 21* 04/01/2016 0510   BUN <5* 04/02/2016 0537   BUN <5* 04/01/2016 0510   CREATININE 0.64 04/02/2016 0537   CREATININE 0.62 04/01/2016 0510   GLUCOSE 99 04/02/2016 0537   GLUCOSE 126* 04/01/2016 0510   CALCIUM 7.7* 04/02/2016 0537   CALCIUM 7.6* 04/01/2016 0510   AST 41 04/01/2016 0510   AST 13* 03/31/2016 0616   ALT 21 04/01/2016 0510   ALT 10* 03/31/2016 0616   ALKPHOS 66 04/01/2016 0510   ALKPHOS 59 03/31/2016 0616   BILITOT 0.4 04/01/2016 0510   BILITOT 0.5 03/31/2016 0616   PROT 5.4* 04/01/2016 0510   PROT 4.7* 03/31/2016 0616   ALBUMIN 2.2* 04/01/2016 0510   ALBUMIN 2.0* 03/31/2016 0616     Studies/Results: No results  found.  Anti-infectives: Anti-infectives    Start     Dose/Rate Route Frequency Ordered Stop   03/29/16 1600  cefTRIAXone (ROCEPHIN) 2 g in dextrose 5 % 50 mL IVPB  Status:  Discontinued     2 g 100 mL/hr over 30 Minutes Intravenous Every 24 hours 03/29/16 0624 04/01/16 0957   03/29/16 0415  cefTRIAXone (ROCEPHIN) 1 g in dextrose 5 % 50 mL IVPB     1 g 100 mL/hr over 30 Minutes Intravenous  Once 03/29/16 0403 03/29/16 0514      Assessment Principal Problem:   Acute cholecystitis s/p lap chole with IOC 03/31/16-doing well from our standpoint. Active Problems:   Senile dementia, uncomplicated       LOS: 4 days   Plan: Okay for discharge from our standpoint.  Follow up with Dr. Ninfa Linden or [facility] in 2 weeks [phone] for appt.)   Alexis York J 04/02/2016

## 2016-04-02 NOTE — Discharge Summary (Addendum)
Physician Discharge Summary  Alexis York [id number] DOB: 09/05/1922 DOA: 03/28/2016  PCP: Donnajean Lopes, MD  Admit date: 03/28/2016 Discharge date: 04/03/16 Admitted From: HOME Disposition:  SNF  Recommendations for Outpatient Follow-up:  1. Follow up with PCP in 1-2 weeks 2. Follow up with general surgery, Dr. Ninfa Linden in 1-2 weeks  Home Health: No Equipment/Devices:No  Discharge Condition:Stable CODE STATUS:DNR Diet recommendation: soft/regular  Brief/Interim Summary 82 year old female with history of dementia, hyperlipidemia, GERD presented with abdominal pain 4 days with decreased oral intake and associated vomiting. CT of the abdomen and pelvis upon presentation revealed distended gallbladder with wall thickening and pericholecystic edema. General surgery was consulted to assist with management. Patient's family was initially hesitant to pursue surgery. The patient was treated medically with antibiotics (ceftriaxone) with clinical improvement. In the late afternoon on 03/30/2016, the patient's family agreed to laparoscopic cholecystectomy. On 03/31/2016, the patient underwent laparoscopic cholecystectomy. Her post operative course was unremarkable and her diet was advanced which she tolerated.  Discharge Diagnoses:   Acute Cholecystitis -03/29/16 CT abd showed Distended gallbladder with gallbladder wall thickening and pericholecystic edema. Calcified gallstone in the gallbladder neck. Common bile duct measured 10 mm -ceftriaxone started 5/23 -General surgery consulted -03/30/2016--daughter agreed to lap chole -03/31/16--lap chole--Dr. Ninfa Linden -advance diet to soft--tolerated well  Dementia:  -Continue Namenda, Aricept and Depakote. Prn haldol for agitation.  Hyponatremia -Secondary to volume depletion -Improved with IVF  Elevated troponin:  Minimal elevation, asymptmoatic. -no chest pain -ECG--no concerning ischemic changes  Hypokalemia -repleted    Arthritis: -Continue celecoxib  Constipation -colace and bisacodyl  Deconditioning -PT eval-->SNF  Discharge Instructions      Discharge Instructions    Ambulatory referral to [place]    Complete by:  As directed   Please evaluate Alexis York for admission to [facility].  Disciplines requested:HOME HEALTH AID, NURSE, PT, OT  Services to provide: HELP WITH MEDICATIONS, HELP WITH MVMT, HELP WITH ADLS  Physician to follow patient's care (the person listed here will be responsible for signing ongoing orders): DR. Medina  Requested Start of Care [id number]  I certify that this patient is under my care and that I, or a Nurse Practitioner or Physician's Assistant working with me, had a face-to-face encounter that meets the physician face-to-face requirements with patient on 03/28/16. The encounter with the patient was in whole, or in part for the following medical condition(s) which is the primary reason for home health care (List medical condition). DEMENTIA, AMBULATORY DYSFUNCTION.  Special Instructions:  Does the patient have Medicare or Medicaid?:  Yes  The encounter with the patient was in whole, or in part, for the following medical condition, which is the primary reason for home health care:  AMBULATORY DYSFCT, DEMENTIA  Reason for Medically Necessary Home Health Services:  Skilled Nursing- Change/Decline in Patient Status  My clinical findings support the need for the above services:  Unsafe ambulation due to balance issues  I certify that, based on my findings, the following services are medically necessary home health services:   Nursing Physical therapy    Further, I certify that my clinical findings support that this patient is homebound due to:  Unsafe ambulation due to balance issues     Diet - low sodium heart healthy    Complete by:  As directed      Increase activity slowly    Complete by:  As directed             Medication List  TAKE these medications        aspirin 81 MG tablet  Take 81 mg by mouth daily.     calcium-vitamin D 500-200 MG-UNIT tablet  Commonly known as:  OSCAL WITH D  Take 1 tablet by mouth 2 (two) times daily.     celecoxib 200 MG capsule  Commonly known as:  CELEBREX  Take 200 mg by mouth 2 (two) times daily.     divalproex 125 MG capsule  Commonly known as:  DEPAKOTE SPRINKLE  Take 125 mg by mouth 2 (two) times daily.     donepezil 10 MG tablet  Commonly known as:  ARICEPT  Take 1 tablet (10 mg total) by mouth daily.     memantine 28 MG Cp24 24 hr capsule  Commonly known as:  NAMENDA XR  Take 1 capsule (28 mg total) by mouth daily.     omeprazole 20 MG capsule  Commonly known as:  PRILOSEC  Take 20 mg by mouth daily.     rosuvastatin 20 MG tablet  Commonly known as:  CRESTOR  Take 20 mg by mouth daily.     traMADol 50 MG tablet  Commonly known as:  ULTRAM  Take 1 tablet (50 mg total) by mouth every 6 (six) hours as needed for moderate pain.     Vitamin D (Ergocalciferol) 50000 units Caps capsule  Commonly known as:  DRISDOL  Take 50,000 Units by mouth every Sunday.       Follow-up Information    Follow up with [facility].   Why:  [place], Physical Therapy, Occupational Therapy and aide   Contact information:   [address] [place] [address] [phone]       Follow up with [facility], MD In 2 weeks.   Specialty:  General Surgery   Contact information:   [address] [place] [address] [phone]      Allergies  Allergen Reactions  . Other Itching and Rash  . Codeine Nausea And Vomiting  . Quinolones Other (See Comments)    Eye irritation, pt unsure  . Benzalkonium Chloride Rash    Pt unsure?    Consultations:  General surgery   Procedures/Studies: Ct Chest W Contrast  03/29/2016  CLINICAL DATA:  82 year old female with upper back and abdominal pain for 3 days. EXAM: CT  CHEST, ABDOMEN, AND PELVIS WITH CONTRAST TECHNIQUE: Multidetector CT imaging of the chest, abdomen and pelvis was performed following the standard protocol during bolus administration of intravenous contrast. CONTRAST:  1 ISOVUE-300 IOPAMIDOL (ISOVUE-300) INJECTION 61% COMPARISON:  None. FINDINGS: CT CHEST FINDINGS Mediastinum/Lymph Nodes: Tortuous atherosclerotic thoracic aorta without aneurysm or dissection. Dense coronary artery calcifications or stents. Heart size is normal. No pericardial effusion. No adenopathy. Lungs/Pleura: No pulmonary mass, infiltrate, or effusion. Tiny 3 mm subpleural nodule left upper lobe series 3, image 55. Scattered atelectasis in both lower lobes and right middle lobe. Musculoskeletal: Multiple compression deformities in the thoracic spine. T10 and T12 compression deformities with vertebral augmentation. T7 and T8 compression deformities without augmentation. Acuity of the T7 and T8 fracture is uncertain. Exaggerated thoracic kyphosis and multilevel degenerative change. CT ABDOMEN PELVIS FINDINGS Hepatobiliary: Distended gallbladder with gallbladder wall thickening and pericholecystic edema. Calcified gallstone in the gallbladder neck. Common bile duct measures 10 mm, likely normal for age. No focal hepatic lesion. Pancreas: No mass, inflammatory changes, or other significant abnormality. Spleen: Within normal limits in size and appearance. Adrenals/Urinary Tract: No masses  identified. No evidence of hydronephrosis. Multiple bilateral parapelvic cysts in both kidneys. Cortical cyst in the upper left kidney. Stomach/Bowel: No evidence of obstruction, inflammatory process, or abnormal fluid collections. Moderate stool burden. Scattered distal colonic diverticular without diverticulitis. Vascular/Lymphatic: No pathologically enlarged lymph nodes. No evidence of abdominal aortic aneurysm. Dense atherosclerosis of the abdominal aorta and its branches, no aneurysm. Reproductive: Uterus is  surgically absent. No adnexal mass. Streak artifact obscures detailed evaluation. Other: None. Musculoskeletal: Multilevel degenerative change throughout the lumbar spine, no compression deformity or acute abnormality. An bilateral hip arthroplasties. IMPRESSION: 1. CT findings consistent with acute cholecystitis. No biliary dilatation. 2. Multiple compression deformities throughout the thoracic spine, chronic at T10 and T12 with vertebral augmentation. T7 and T8 compression fractures of uncertain acuity. 3. Diffuse atherosclerosis including coronary artery calcifications. 4. Tiny 3 mm subpleural nodule in the left upper lobe, this is likely infectious or inflammatory. No follow-up needed if patient is low-risk. Non-contrast chest CT can be considered in 12 months if patient is high-risk. This recommendation follows the consensus statement: Guidelines for Management of Incidental Pulmonary Nodules Detected on CT Images:From the Fleischner Society 2017; published online before print (10.1148/radiol.SG:5268862). Electronically Signed   By: Jeb Levering M.D.   On: 03/29/2016 02:56   Ct Abdomen Pelvis W Contrast  03/29/2016  CLINICAL DATA:  82 year old female with upper back and abdominal pain for 3 days. EXAM: CT CHEST, ABDOMEN, AND PELVIS WITH CONTRAST TECHNIQUE: Multidetector CT imaging of the chest, abdomen and pelvis was performed following the standard protocol during bolus administration of intravenous contrast. CONTRAST:  1 ISOVUE-300 IOPAMIDOL (ISOVUE-300) INJECTION 61% COMPARISON:  None. FINDINGS: CT CHEST FINDINGS Mediastinum/Lymph Nodes: Tortuous atherosclerotic thoracic aorta without aneurysm or dissection. Dense coronary artery calcifications or stents. Heart size is normal. No pericardial effusion. No adenopathy. Lungs/Pleura: No pulmonary mass, infiltrate, or effusion. Tiny 3 mm subpleural nodule left upper lobe series 3, image 55. Scattered atelectasis in both lower lobes and right middle lobe.  Musculoskeletal: Multiple compression deformities in the thoracic spine. T10 and T12 compression deformities with vertebral augmentation. T7 and T8 compression deformities without augmentation. Acuity of the T7 and T8 fracture is uncertain. Exaggerated thoracic kyphosis and multilevel degenerative change. CT ABDOMEN PELVIS FINDINGS Hepatobiliary: Distended gallbladder with gallbladder wall thickening and pericholecystic edema. Calcified gallstone in the gallbladder neck. Common bile duct measures 10 mm, likely normal for age. No focal hepatic lesion. Pancreas: No mass, inflammatory changes, or other significant abnormality. Spleen: Within normal limits in size and appearance. Adrenals/Urinary Tract: No masses identified. No evidence of hydronephrosis. Multiple bilateral parapelvic cysts in both kidneys. Cortical cyst in the upper left kidney. Stomach/Bowel: No evidence of obstruction, inflammatory process, or abnormal fluid collections. Moderate stool burden. Scattered distal colonic diverticular without diverticulitis. Vascular/Lymphatic: No pathologically enlarged lymph nodes. No evidence of abdominal aortic aneurysm. Dense atherosclerosis of the abdominal aorta and its branches, no aneurysm. Reproductive: Uterus is surgically absent. No adnexal mass. Streak artifact obscures detailed evaluation. Other: None. Musculoskeletal: Multilevel degenerative change throughout the lumbar spine, no compression deformity or acute abnormality. An bilateral hip arthroplasties. IMPRESSION: 1. CT findings consistent with acute cholecystitis. No biliary dilatation. 2. Multiple compression deformities throughout the thoracic spine, chronic at T10 and T12 with vertebral augmentation. T7 and T8 compression fractures of uncertain acuity. 3. Diffuse atherosclerosis including coronary artery calcifications. 4. Tiny 3 mm subpleural nodule in the left upper lobe, this is likely infectious or inflammatory. No follow-up needed if patient is  low-risk. Non-contrast chest CT can be  considered in 12 months if patient is high-risk. This recommendation follows the consensus statement: Guidelines for Management of Incidental Pulmonary Nodules Detected on CT Images:From the Fleischner Society 2017; published online before print (10.1148/radiol.IJ:2314499). Electronically Signed   By: Jeb Levering M.D.   On: 03/29/2016 02:56     Subjective:   Discharge Exam: Filed Vitals:   04/03/16 0639 04/03/16 1000  BP: 132/57 130/47  Pulse: 75 65  Temp: 97.6 F (36.4 C) 98 F (36.7 C)  Resp: 16 18   Filed Vitals:   04/02/16 1700 04/02/16 2142 04/03/16 0639 04/03/16 1000  BP: 126/61 129/84 132/57 130/47  Pulse: 65 90 75 65  Temp: 98 F (36.7 C) 98.7 F (37.1 C) 97.6 F (36.4 C) 98 F (36.7 C)  TempSrc: Oral Oral Oral Oral  Resp: 18 20 16 18   Height:      Weight:  77.2 kg (170 lb 3.1 oz)    SpO2: 98% 95% 95% 96%    General: Pt is alert, awake, not in acute distress Cardiovascular: RRR, S1/S2 +, no rubs, no gallops Respiratory: CTA bilaterally, no wheezing, no rhonchi Abdominal: Soft, NT, ND, bowel sounds + Extremities: no edema, no cyanosis   The results of significant diagnostics from this hospitalization (including imaging, microbiology, ancillary and laboratory) are listed below for reference.    Significant Diagnostic Studies: Ct Chest W Contrast  03/29/2016  CLINICAL DATA:  82 year old female with upper back and abdominal pain for 3 days. EXAM: CT CHEST, ABDOMEN, AND PELVIS WITH CONTRAST TECHNIQUE: Multidetector CT imaging of the chest, abdomen and pelvis was performed following the standard protocol during bolus administration of intravenous contrast. CONTRAST:  1 ISOVUE-300 IOPAMIDOL (ISOVUE-300) INJECTION 61% COMPARISON:  None. FINDINGS: CT CHEST FINDINGS Mediastinum/Lymph Nodes: Tortuous atherosclerotic thoracic aorta without aneurysm or dissection. Dense coronary artery calcifications or stents. Heart size is normal.  No pericardial effusion. No adenopathy. Lungs/Pleura: No pulmonary mass, infiltrate, or effusion. Tiny 3 mm subpleural nodule left upper lobe series 3, image 55. Scattered atelectasis in both lower lobes and right middle lobe. Musculoskeletal: Multiple compression deformities in the thoracic spine. T10 and T12 compression deformities with vertebral augmentation. T7 and T8 compression deformities without augmentation. Acuity of the T7 and T8 fracture is uncertain. Exaggerated thoracic kyphosis and multilevel degenerative change. CT ABDOMEN PELVIS FINDINGS Hepatobiliary: Distended gallbladder with gallbladder wall thickening and pericholecystic edema. Calcified gallstone in the gallbladder neck. Common bile duct measures 10 mm, likely normal for age. No focal hepatic lesion. Pancreas: No mass, inflammatory changes, or other significant abnormality. Spleen: Within normal limits in size and appearance. Adrenals/Urinary Tract: No masses identified. No evidence of hydronephrosis. Multiple bilateral parapelvic cysts in both kidneys. Cortical cyst in the upper left kidney. Stomach/Bowel: No evidence of obstruction, inflammatory process, or abnormal fluid collections. Moderate stool burden. Scattered distal colonic diverticular without diverticulitis. Vascular/Lymphatic: No pathologically enlarged lymph nodes. No evidence of abdominal aortic aneurysm. Dense atherosclerosis of the abdominal aorta and its branches, no aneurysm. Reproductive: Uterus is surgically absent. No adnexal mass. Streak artifact obscures detailed evaluation. Other: None. Musculoskeletal: Multilevel degenerative change throughout the lumbar spine, no compression deformity or acute abnormality. An bilateral hip arthroplasties. IMPRESSION: 1. CT findings consistent with acute cholecystitis. No biliary dilatation. 2. Multiple compression deformities throughout the thoracic spine, chronic at T10 and T12 with vertebral augmentation. T7 and T8 compression  fractures of uncertain acuity. 3. Diffuse atherosclerosis including coronary artery calcifications. 4. Tiny 3 mm subpleural nodule in the left upper lobe, this is likely infectious  or inflammatory. No follow-up needed if patient is low-risk. Non-contrast chest CT can be considered in 12 months if patient is high-risk. This recommendation follows the consensus statement: Guidelines for Management of Incidental Pulmonary Nodules Detected on CT Images:From the Fleischner Society 2017; published online before print (10.1148/radiol.SG:5268862). Electronically Signed   By: Jeb Levering M.D.   On: 03/29/2016 02:56   Ct Abdomen Pelvis W Contrast  03/29/2016  CLINICAL DATA:  82 year old female with upper back and abdominal pain for 3 days. EXAM: CT CHEST, ABDOMEN, AND PELVIS WITH CONTRAST TECHNIQUE: Multidetector CT imaging of the chest, abdomen and pelvis was performed following the standard protocol during bolus administration of intravenous contrast. CONTRAST:  1 ISOVUE-300 IOPAMIDOL (ISOVUE-300) INJECTION 61% COMPARISON:  None. FINDINGS: CT CHEST FINDINGS Mediastinum/Lymph Nodes: Tortuous atherosclerotic thoracic aorta without aneurysm or dissection. Dense coronary artery calcifications or stents. Heart size is normal. No pericardial effusion. No adenopathy. Lungs/Pleura: No pulmonary mass, infiltrate, or effusion. Tiny 3 mm subpleural nodule left upper lobe series 3, image 55. Scattered atelectasis in both lower lobes and right middle lobe. Musculoskeletal: Multiple compression deformities in the thoracic spine. T10 and T12 compression deformities with vertebral augmentation. T7 and T8 compression deformities without augmentation. Acuity of the T7 and T8 fracture is uncertain. Exaggerated thoracic kyphosis and multilevel degenerative change. CT ABDOMEN PELVIS FINDINGS Hepatobiliary: Distended gallbladder with gallbladder wall thickening and pericholecystic edema. Calcified gallstone in the gallbladder neck.  Common bile duct measures 10 mm, likely normal for age. No focal hepatic lesion. Pancreas: No mass, inflammatory changes, or other significant abnormality. Spleen: Within normal limits in size and appearance. Adrenals/Urinary Tract: No masses identified. No evidence of hydronephrosis. Multiple bilateral parapelvic cysts in both kidneys. Cortical cyst in the upper left kidney. Stomach/Bowel: No evidence of obstruction, inflammatory process, or abnormal fluid collections. Moderate stool burden. Scattered distal colonic diverticular without diverticulitis. Vascular/Lymphatic: No pathologically enlarged lymph nodes. No evidence of abdominal aortic aneurysm. Dense atherosclerosis of the abdominal aorta and its branches, no aneurysm. Reproductive: Uterus is surgically absent. No adnexal mass. Streak artifact obscures detailed evaluation. Other: None. Musculoskeletal: Multilevel degenerative change throughout the lumbar spine, no compression deformity or acute abnormality. An bilateral hip arthroplasties. IMPRESSION: 1. CT findings consistent with acute cholecystitis. No biliary dilatation. 2. Multiple compression deformities throughout the thoracic spine, chronic at T10 and T12 with vertebral augmentation. T7 and T8 compression fractures of uncertain acuity. 3. Diffuse atherosclerosis including coronary artery calcifications. 4. Tiny 3 mm subpleural nodule in the left upper lobe, this is likely infectious or inflammatory. No follow-up needed if patient is low-risk. Non-contrast chest CT can be considered in 12 months if patient is high-risk. This recommendation follows the consensus statement: Guidelines for Management of Incidental Pulmonary Nodules Detected on CT Images:From the Fleischner Society 2017; published online before print (10.1148/radiol.SG:5268862). Electronically Signed   By: Jeb Levering M.D.   On: 03/29/2016 02:56     Microbiology: Recent Results (from the past 240 hour(s))  Surgical pcr screen      Status: None   Collection Time: 03/30/16  9:56 PM  Result Value Ref Range Status   MRSA, PCR NEGATIVE NEGATIVE Final   Staphylococcus aureus NEGATIVE NEGATIVE Final    Comment:        The Xpert SA Assay (FDA approved for NASAL specimens in patients over 75 years of age), is one component of a comprehensive surveillance program.  Test performance has been validated by [facility] for patients greater than or equal to 55 year old. It  is not intended to diagnose infection nor to guide or monitor treatment.      Labs: Basic Metabolic Panel:  Recent Labs Lab 03/28/16 1446 03/29/16 0637 03/31/16 0616 04/01/16 0510 04/02/16 0537  NA 133* 135 141 139 139  K 3.7 3.4* 3.1* 4.0 3.7  CL 102 101 114* 111 110  CO2 23 26 23  21* 23  GLUCOSE 112* 85 87 126* 99  BUN 17 15 <5* <5* <5*  CREATININE 0.92 0.71 0.67 0.62 0.64  CALCIUM 9.0 8.1* 7.5* 7.6* 7.7*   Liver Function Tests:  Recent Labs Lab 03/28/16 1446 03/29/16 0637 03/31/16 0616 04/01/16 0510  AST 22 15 13* 41  ALT 17 13* 10* 21  ALKPHOS 75 63 59 66  BILITOT 1.4* 1.2 0.5 0.4  PROT 6.6 5.7* 4.7* 5.4*  ALBUMIN 3.0* 2.5* 2.0* 2.2*    Recent Labs Lab 03/28/16 1446  LIPASE 23   No results for input(s): AMMONIA in the last 168 hours. CBC:  Recent Labs Lab 03/28/16 1446 03/29/16 0637 03/31/16 0616 04/01/16 0510  WBC 15.4* 10.5 7.5 8.1  HGB 13.9 12.1 10.4* 11.9*  HCT 43.1 37.6 33.5* 37.6  MCV 91.9 93.8 95.2 92.4  PLT 243 200 185 237   Cardiac Enzymes:  Recent Labs Lab 03/28/16 2242 03/29/16 0637  TROPONINI 0.04* <0.03   BNP: Invalid input(s): POCBNP CBG: No results for input(s): GLUCAP in the last 168 hours.  Time coordinating discharge:  Greater than 30 minutes  Signed:  Ever Gustafson, DO [facility] Pager: [phone] 04/03/2016, 11:26 AM

## 2016-04-02 NOTE — Evaluation (Signed)
Physical Therapy Evaluation Patient Details Name: Alexis York MRN: [id number] DOB: [birth year] Today's Date: 04/02/2016   History of Present Illness  Pt adm with Acute cholecystitis s/p lap chole. PMH - dementia, TKR, breast CA, THR  Clinical Impression  Pt admitted with above diagnosis and presents to PT with functional limitations due to deficits listed below (See PT problem list). Pt needs skilled PT to maximize independence and safety to allow discharge to ST-SNF prior to return home with daughter. Daughter needs pt to be ambulatory prior to return home.    Follow Up Recommendations SNF    Equipment Recommendations  None recommended by PT    Recommendations for Other Services       Precautions / Restrictions Precautions Precautions: Fall Restrictions Weight Bearing Restrictions: No      Mobility  Bed Mobility Overal bed mobility: Needs Assistance Bed Mobility: Supine to Sit     Supine to sit: +2 for physical assistance;Max assist     General bed mobility comments: Assist to bring legs off and to elevate trunk into sitting  Transfers Overall transfer level: Needs assistance Equipment used: Rolling walker (2 wheeled) Transfers: Sit to/from [employer] Transfers Sit to Stand: +2 physical assistance;Max assist;Mod assist Stand pivot transfers: +2 physical assistance;Mod assist       General transfer comment: Assist to bring hips and trunk up. Verbal/tactile cues to stand more erect  Ambulation/Gait Ambulation/Gait assistance: +2 physical assistance;Mod assist Ambulation Distance (Feet): 5 Feet Assistive device: Rolling walker (2 wheeled) Gait Pattern/deviations: Step-through pattern;Decreased step length - right;Decreased step length - left;Shuffle;Trunk flexed Gait velocity: decr Gait velocity interpretation: <1.8 ft/sec, indicative of risk for recurrent falls General Gait Details: Verbal cues to stand more erect. Pt self limited distance and likely  could amb a little further.  Stairs            Wheelchair Mobility    Modified Rankin (Stroke Patients Only)       Balance Overall balance assessment: Needs assistance Sitting-balance support: No upper extremity supported;Feet supported Sitting balance-Leahy Scale: Fair     Standing balance support: Bilateral upper extremity supported Standing balance-Leahy Scale: Zero Standing balance comment: walker and 2 person assist for static standing                             Pertinent Vitals/Pain Pain Assessment: Faces Faces Pain Scale: No hurt    Home Living Family/patient expects to be discharged to:: Skilled nursing facility Living Arrangements: Children                    Prior Function Level of Independence: Needs assistance   Gait / Transfers Assistance Needed: Pt amb with rolling walker modified independent to supervision           Hand Dominance   Dominant Hand: Right    Extremity/Trunk Assessment   Upper Extremity Assessment: Generalized weakness           Lower Extremity Assessment: Generalized weakness      Cervical / Trunk Assessment: Kyphotic  Communication   Communication: HOH  Cognition Arousal/Alertness: Awake/alert Behavior During Therapy: WFL for tasks assessed/performed Overall Cognitive Status: History of cognitive impairments - at baseline                      General Comments      Exercises        Assessment/Plan    PT Assessment Patient  needs continued PT services  PT Diagnosis Difficulty walking;Generalized weakness   PT Problem List Decreased strength;Decreased activity tolerance;Decreased balance;Decreased mobility;Decreased knowledge of use of DME  PT Treatment Interventions DME instruction;Gait training;Functional mobility training;Therapeutic activities;Therapeutic exercise;Balance training;Patient/family education   PT Goals (Current goals can be found in the Care Plan section) Acute  Rehab PT Goals Patient Stated Goal: Pt didn't state. Daughter needs pt to be ambulatory before return home PT Goal Formulation: With family Time For Goal Achievement: 04/16/16 Potential to Achieve Goals: Good    Frequency Min 2X/week   Barriers to discharge        Co-evaluation               End of Session Equipment Utilized During Treatment: Gait belt Activity Tolerance: Patient tolerated treatment well Patient left: in chair;with call bell/phone within reach;with chair alarm set;with family/visitor present Nurse Communication: Mobility status         Time: 1015-1040 PT Time Calculation (min) (ACUTE ONLY): 25 min   Charges:         PT G Codes:        Claiborne Stroble 04-21-2016, 12:15 PM [facility] PT [phone]

## 2016-04-02 NOTE — Progress Notes (Signed)
PROGRESS NOTE  Alexis York [id number] DOB: 08-05-1922 DOA: 03/28/2016 PCP: Donnajean Lopes, MD  Brief History:  82 year old female with history of dementia, hyperlipidemia, GERD presented with abdominal pain 4 days with decreased oral intake and associated vomiting. CT of the abdomen and pelvis upon presentation revealed distended gallbladder with wall thickening and pericholecystic edema. General surgery was consulted to assist with management. Patient's family was hesitant to pursue surgery. The patient was treated medically with antibiotics with clinical improvement. In the late afternoon on 03/30/2016, the patient's family agreed to laparoscopic cholecystectomy. On 03/31/2016, the patient underwent laparoscopic cholecystectomy.  Assessment/Plan: Acute Cholecystitis -03/30/2016--daughter agreed to lap chole -03/31/16--lap chole--Dr. Ninfa Linden -advance diet to soft  Dementia:  -Continue Namenda, Aricept and Depakote. Prn haldol for agitation.  Hyponatremia -Secondary to volume depletion -Improved with IVF  Elevated troponin:  Minimal elevation, asymptmoatic.  Hypokalemia -repleted   Arthritis: -Continue celecoxib  Constipation -colace and bisacodyl  Deconditioning -PT eval-->SNF  Disposition Plan: SNF 5/28 if stable Family Communication: Daughter updated at beside 5/27  Consultants: CCS  Code Status: DNR  Subjective: Patient complains of intermittent abdominal pain. She is eating full liquids without difficulty. Denies any fevers, chills, chest pain, shortness breath, vomiting, diarrhea.  Objective: Filed Vitals:   04/01/16 1649 04/01/16 2110 04/02/16 0513 04/02/16 1000  BP: 125/64 121/44 127/58 128/52  Pulse: 93 67 80 67  Temp: 97.8 F (36.6 C) 97.6 F (36.4 C) 97.7 F (36.5 C) 98.2 F (36.8 C)  TempSrc: Oral Oral Oral Oral  Resp: 16 18 20 18   Height:      Weight:   74.3 kg (163 lb 12.8 oz)   SpO2: 99% 98% 100% 99%     Intake/Output Summary (Last 24 hours) at 04/02/16 1536 Last data filed at 04/02/16 0900  Gross per 24 hour  Intake   1510 ml  Output      0 ml  Net   1510 ml   Weight change: -1.2 kg (-2 lb 10.3 oz) Exam:   General:  Pt is alert, follows commands appropriately, not in acute distress  HEENT: No icterus, No thrush, No neck mass, [place]/AT  Cardiovascular: RRR, S1/S2, no rubs, no gallops  Respiratory: Bibasilar crackles. No wheezing. Good air movement.  Abdomen: Soft/+BS, non tender, non distended, no guarding  Extremities: No edema, No lymphangitis, No petechiae, No rashes, no synovitis   Data Reviewed: I have personally reviewed following labs and imaging studies Basic Metabolic Panel:  Recent Labs Lab 03/28/16 1446 03/29/16 0637 03/31/16 0616 04/01/16 0510 04/02/16 0537  NA 133* 135 141 139 139  K 3.7 3.4* 3.1* 4.0 3.7  CL 102 101 114* 111 110  CO2 23 26 23  21* 23  GLUCOSE 112* 85 87 126* 99  BUN 17 15 <5* <5* <5*  CREATININE 0.92 0.71 0.67 0.62 0.64  CALCIUM 9.0 8.1* 7.5* 7.6* 7.7*   Liver Function Tests:  Recent Labs Lab 03/28/16 1446 03/29/16 0637 03/31/16 0616 04/01/16 0510  AST 22 15 13* 41  ALT 17 13* 10* 21  ALKPHOS 75 63 59 66  BILITOT 1.4* 1.2 0.5 0.4  PROT 6.6 5.7* 4.7* 5.4*  ALBUMIN 3.0* 2.5* 2.0* 2.2*    Recent Labs Lab 03/28/16 1446  LIPASE 23   No results for input(s): AMMONIA in the last 168 hours. Coagulation Profile: No results for input(s): INR, PROTIME in the last 168 hours. CBC:  Recent Labs Lab 03/28/16 1446 03/29/16 [id number] 03/31/16 [id number]  04/01/16 0510  WBC 15.4* 10.5 7.5 8.1  HGB 13.9 12.1 10.4* 11.9*  HCT 43.1 37.6 33.5* 37.6  MCV 91.9 93.8 95.2 92.4  PLT 243 200 185 237   Cardiac Enzymes:  Recent Labs Lab 03/28/16 2242 03/29/16 0637  TROPONINI 0.04* <0.03   BNP: Invalid input(s): POCBNP CBG: No results for input(s): GLUCAP in the last 168 hours. HbA1C: No results for input(s): HGBA1C in the last 72  hours. Urine analysis:    Component Value Date/Time   COLORURINE YELLOW 03/29/2016 0121   APPEARANCEUR HAZY* 03/29/2016 0121   LABSPEC 1.025 03/29/2016 0121   PHURINE 6.5 03/29/2016 0121   GLUCOSEU NEGATIVE 03/29/2016 0121   HGBUR NEGATIVE 03/29/2016 0121   BILIRUBINUR NEGATIVE 03/29/2016 0121   KETONESUR 15* 03/29/2016 0121   PROTEINUR NEGATIVE 03/29/2016 0121   UROBILINOGEN 0.2 10/28/2014 2342   NITRITE NEGATIVE 03/29/2016 0121   LEUKOCYTESUR NEGATIVE 03/29/2016 0121   Sepsis Labs: @LABRCNTIP (procalcitonin:4,lacticidven:4) ) Recent Results (from the past 240 hour(s))  Surgical pcr screen     Status: None   Collection Time: 03/30/16  9:56 PM  Result Value Ref Range Status   MRSA, PCR NEGATIVE NEGATIVE Final   Staphylococcus aureus NEGATIVE NEGATIVE Final    Comment:        The Xpert SA Assay (FDA approved for NASAL specimens in patients over 27 years of age), is one component of a comprehensive surveillance program.  Test performance has been validated by [facility] for patients greater than or equal to 73 year old. It is not intended to diagnose infection nor to guide or monitor treatment.      Scheduled Meds: . aspirin  81 mg Oral Daily  . celecoxib  200 mg Oral BID  . divalproex  125 mg Oral BID  . docusate sodium  100 mg Oral BID  . donepezil  10 mg Oral QHS  . enoxaparin (LOVENOX) injection  40 mg Subcutaneous Q24H  . memantine  28 mg Oral Daily  . pantoprazole  40 mg Oral Daily  . rosuvastatin  20 mg Oral q1800   Continuous Infusions: . dextrose 5 % and 0.9 % NaCl with KCl 20 mEq/L 75 mL/hr at 04/02/16 1133  . lactated ringers Stopped (03/31/16 1330)    Procedures/Studies: Ct Chest W Contrast  03/29/2016  CLINICAL DATA:  82 year old female with upper back and abdominal pain for 3 days. EXAM: CT CHEST, ABDOMEN, AND PELVIS WITH CONTRAST TECHNIQUE: Multidetector CT imaging of the chest, abdomen and pelvis was performed following the standard protocol  during bolus administration of intravenous contrast. CONTRAST:  1 ISOVUE-300 IOPAMIDOL (ISOVUE-300) INJECTION 61% COMPARISON:  None. FINDINGS: CT CHEST FINDINGS Mediastinum/Lymph Nodes: Tortuous atherosclerotic thoracic aorta without aneurysm or dissection. Dense coronary artery calcifications or stents. Heart size is normal. No pericardial effusion. No adenopathy. Lungs/Pleura: No pulmonary mass, infiltrate, or effusion. Tiny 3 mm subpleural nodule left upper lobe series 3, image 55. Scattered atelectasis in both lower lobes and right middle lobe. Musculoskeletal: Multiple compression deformities in the thoracic spine. T10 and T12 compression deformities with vertebral augmentation. T7 and T8 compression deformities without augmentation. Acuity of the T7 and T8 fracture is uncertain. Exaggerated thoracic kyphosis and multilevel degenerative change. CT ABDOMEN PELVIS FINDINGS Hepatobiliary: Distended gallbladder with gallbladder wall thickening and pericholecystic edema. Calcified gallstone in the gallbladder neck. Common bile duct measures 10 mm, likely normal for age. No focal hepatic lesion. Pancreas: No mass, inflammatory changes, or other significant abnormality. Spleen: Within normal limits in size and appearance. Adrenals/Urinary  Tract: No masses identified. No evidence of hydronephrosis. Multiple bilateral parapelvic cysts in both kidneys. Cortical cyst in the upper left kidney. Stomach/Bowel: No evidence of obstruction, inflammatory process, or abnormal fluid collections. Moderate stool burden. Scattered distal colonic diverticular without diverticulitis. Vascular/Lymphatic: No pathologically enlarged lymph nodes. No evidence of abdominal aortic aneurysm. Dense atherosclerosis of the abdominal aorta and its branches, no aneurysm. Reproductive: Uterus is surgically absent. No adnexal mass. Streak artifact obscures detailed evaluation. Other: None. Musculoskeletal: Multilevel degenerative change throughout  the lumbar spine, no compression deformity or acute abnormality. An bilateral hip arthroplasties. IMPRESSION: 1. CT findings consistent with acute cholecystitis. No biliary dilatation. 2. Multiple compression deformities throughout the thoracic spine, chronic at T10 and T12 with vertebral augmentation. T7 and T8 compression fractures of uncertain acuity. 3. Diffuse atherosclerosis including coronary artery calcifications. 4. Tiny 3 mm subpleural nodule in the left upper lobe, this is likely infectious or inflammatory. No follow-up needed if patient is low-risk. Non-contrast chest CT can be considered in 12 months if patient is high-risk. This recommendation follows the consensus statement: Guidelines for Management of Incidental Pulmonary Nodules Detected on CT Images:From the Fleischner Society 2017; published online before print (10.1148/radiol.SG:5268862). Electronically Signed   By: Jeb Levering M.D.   On: 03/29/2016 02:56   Ct Abdomen Pelvis W Contrast  03/29/2016  CLINICAL DATA:  82 year old female with upper back and abdominal pain for 3 days. EXAM: CT CHEST, ABDOMEN, AND PELVIS WITH CONTRAST TECHNIQUE: Multidetector CT imaging of the chest, abdomen and pelvis was performed following the standard protocol during bolus administration of intravenous contrast. CONTRAST:  1 ISOVUE-300 IOPAMIDOL (ISOVUE-300) INJECTION 61% COMPARISON:  None. FINDINGS: CT CHEST FINDINGS Mediastinum/Lymph Nodes: Tortuous atherosclerotic thoracic aorta without aneurysm or dissection. Dense coronary artery calcifications or stents. Heart size is normal. No pericardial effusion. No adenopathy. Lungs/Pleura: No pulmonary mass, infiltrate, or effusion. Tiny 3 mm subpleural nodule left upper lobe series 3, image 55. Scattered atelectasis in both lower lobes and right middle lobe. Musculoskeletal: Multiple compression deformities in the thoracic spine. T10 and T12 compression deformities with vertebral augmentation. T7 and T8  compression deformities without augmentation. Acuity of the T7 and T8 fracture is uncertain. Exaggerated thoracic kyphosis and multilevel degenerative change. CT ABDOMEN PELVIS FINDINGS Hepatobiliary: Distended gallbladder with gallbladder wall thickening and pericholecystic edema. Calcified gallstone in the gallbladder neck. Common bile duct measures 10 mm, likely normal for age. No focal hepatic lesion. Pancreas: No mass, inflammatory changes, or other significant abnormality. Spleen: Within normal limits in size and appearance. Adrenals/Urinary Tract: No masses identified. No evidence of hydronephrosis. Multiple bilateral parapelvic cysts in both kidneys. Cortical cyst in the upper left kidney. Stomach/Bowel: No evidence of obstruction, inflammatory process, or abnormal fluid collections. Moderate stool burden. Scattered distal colonic diverticular without diverticulitis. Vascular/Lymphatic: No pathologically enlarged lymph nodes. No evidence of abdominal aortic aneurysm. Dense atherosclerosis of the abdominal aorta and its branches, no aneurysm. Reproductive: Uterus is surgically absent. No adnexal mass. Streak artifact obscures detailed evaluation. Other: None. Musculoskeletal: Multilevel degenerative change throughout the lumbar spine, no compression deformity or acute abnormality. An bilateral hip arthroplasties. IMPRESSION: 1. CT findings consistent with acute cholecystitis. No biliary dilatation. 2. Multiple compression deformities throughout the thoracic spine, chronic at T10 and T12 with vertebral augmentation. T7 and T8 compression fractures of uncertain acuity. 3. Diffuse atherosclerosis including coronary artery calcifications. 4. Tiny 3 mm subpleural nodule in the left upper lobe, this is likely infectious or inflammatory. No follow-up needed if patient is low-risk. Non-contrast chest CT  can be considered in 12 months if patient is high-risk. This recommendation follows the consensus statement:  Guidelines for Management of Incidental Pulmonary Nodules Detected on CT Images:From the Fleischner Society 2017; published online before print (10.1148/radiol.IJ:2314499). Electronically Signed   By: Jeb Levering M.D.   On: 03/29/2016 02:56    Dekari Bures, DO  [facility] Pager [phone]  If 7PM-7AM, please contact night-coverage [email or web address] Password [email or web address] 04/02/2016, 3:36 PM   LOS: 4 days

## 2016-04-03 MED ORDER — SODIUM CHLORIDE 0.9 % IV SOLN
INTRAVENOUS | Status: AC
  Administered 2016-04-03: via INTRAVENOUS
  Filled 2016-04-03 (×2): qty 1000

## 2016-04-03 NOTE — Care Management (Signed)
CM and  Jonni Sanger RN   Met with patient and daughter regarding patient being accepted to [facility]. However, patient's daughter Georg Ruddle  Is refusing that patient  go to a  SNF in [place], daughter wants patient to be at a SNF in [place]. She states, she was told by CSW today that, the SNF of her choice does not admit on weekends.CM explained that patient is medically cleared for discharge, and once medically cleared the insurance will not pay and patient may incur the cost. Daughter  stated, that she will appeal the discharge. CM will continue to follow

## 2016-04-03 NOTE — Progress Notes (Signed)
SNF facilities in [facility] unable to accept patient today (due to lack of bed or no staff in for holiday). [facility] able to accept patient, but patient's daughter Lelon Frohlich does not want patient to go to [facility] since she has never been there and the distance is far from her. Daughter is considering appealing discharge. CSW to continue to follow.  Percell Locus Dajion Bickford LCSWA [phone]

## 2016-04-03 NOTE — Clinical Social Work Placement (Signed)
   CLINICAL SOCIAL WORK PLACEMENT  NOTE  Date:  04/03/2016  Patient Details  Name: Alexis York MRN: [id number] Date of Birth: [birth year]  Clinical Social Work is seeking post-discharge placement for this patient at the [place] level of care (*CSW will initial, date and re-position this form in  chart as items are completed):      Patient/family provided with [place] Work Department's list of facilities offering this level of care within the geographic area requested by the patient (or if unable, by the patient's family).      Patient/family informed of their freedom to choose among providers that offer the needed level of care, that participate in Medicare, Medicaid or managed care program needed by the patient, have an available bed and are willing to accept the patient.      Patient/family informed of [place]'s ownership interest in [facility] and [facility], as well as of the fact that they are under no obligation to receive care at these facilities.  PASRR submitted to EDS on       PASRR number received on       Existing PASRR number confirmed on 04/03/16     FL2 transmitted to all facilities in geographic area requested by pt/family on 04/03/16     FL2 transmitted to all facilities within larger geographic area on       Patient informed that his/her managed care company has contracts with or will negotiate with certain facilities, including the following:            Patient/family informed of bed offers received.  Patient chooses bed at       Physician recommends and patient chooses bed at      Patient to be transferred to   on  .  Patient to be transferred to facility by       Patient family notified on   of transfer.  Name of family member notified:        PHYSICIAN Please sign FL2     Additional Comment:    _______________________________________________ Benard Halsted, [place] 04/03/2016, 8:23 AM

## 2016-04-03 NOTE — NC FL2 (Signed)
[place] LEVEL OF CARE SCREENING TOOL     IDENTIFICATION  Patient Name: Alexis York Birthdate: 12/31/1921 Sex: female Admission Date (Current Location): 03/28/2016  [facility] and [place] Number:  Herbalist and Address:  [facility], [place] [address], [place],  [address]      Provider Number: [id number]  Attending Physician Name and Address:  Orson Eva, MD  Relative Name and Phone Number:  Lelon Frohlich, daughter, [phone]    Current Level of Care: Hospital Recommended Level of Care: [place] Prior Approval Number:    Date Approved/Denied:   PASRR Number: [id number]  Discharge Plan: SNF    Current Diagnoses: Patient Active Problem List   Diagnosis Date Noted  . Cholecystitis 03/29/2016  . Hyponatremia 03/29/2016  . Elevated troponin 03/29/2016  . Acute cholecystitis 03/29/2016  . Primary osteoarthritis of left knee 10/27/2014  . Knee osteoarthritis 10/27/2014  . Multifactorial gait disorder 10/23/2013  . Dementia arising in the senium and presenium 10/23/2013  . Senile dementia, uncomplicated [id number]  . Abnormality of gait 04/30/2013    Orientation RESPIRATION BLADDER Height & Weight     Self  Normal Incontinent Weight: 77.2 kg (170 lb 3.1 oz) Height:  5\' 5"  (165.1 cm)  BEHAVIORAL SYMPTOMS/MOOD NEUROLOGICAL BOWEL NUTRITION STATUS      Incontinent Diet (Please see DC Summary)  AMBULATORY STATUS COMMUNICATION OF NEEDS Skin   Extensive Assist Verbally Surgical wounds (Closed incision on abdomen)                       Personal Care Assistance Level of Assistance  Bathing, Feeding, Dressing Bathing Assistance: Maximum assistance Feeding assistance: Maximum assistance Dressing Assistance: Maximum assistance     Functional Limitations Info             SPECIAL CARE FACTORS FREQUENCY  PT (By licensed PT)     PT Frequency: min 2x/week              Contractures       Additional Factors Info  Code Status, Allergies Code Status Info: DNR Allergies Info: Other, Codeine, Quinolones, Benzalkonium Chloride           Current Medications (04/03/2016):  This is the current hospital active medication list Current Facility-Administered Medications  Medication Dose Route Frequency Provider Last Rate Last Dose  . acetaminophen (TYLENOL) tablet 325-650 mg  325-650 mg Oral Q6H PRN Emina Riebock, NP      . aspirin chewable tablet 81 mg  81 mg Oral Daily Edwin Dada, MD   81 mg at 04/02/16 0954  . celecoxib (CELEBREX) capsule 200 mg  200 mg Oral BID Edwin Dada, MD   200 mg at 04/02/16 2336  . divalproex (DEPAKOTE SPRINKLE) capsule 125 mg  125 mg Oral BID Edwin Dada, MD   125 mg at 04/02/16 2336  . docusate sodium (COLACE) capsule 100 mg  100 mg Oral BID Orson Eva, MD   100 mg at 04/02/16 0954  . donepezil (ARICEPT) tablet 10 mg  10 mg Oral QHS Edwin Dada, MD   10 mg at 04/02/16 2336  . enoxaparin (LOVENOX) injection 40 mg  40 mg Subcutaneous Q24H Edwin Dada, MD   40 mg at 04/02/16 0852  . haloperidol lactate (HALDOL) injection 2 mg  2 mg Intravenous Q6H PRN Nishant Dhungel, MD   2 mg at 04/01/16 2238  . lactated ringers infusion   Intravenous Continuous Jenny Reichmann  Lissa Hoard, MD   Stopped at 03/31/16 1330  . memantine (NAMENDA XR) 24 hr capsule 28 mg  28 mg Oral Daily Edwin Dada, MD   28 mg at 04/02/16 0954  . ondansetron (ZOFRAN) tablet 4 mg  4 mg Oral Q6H PRN Edwin Dada, MD       Or  . ondansetron (ZOFRAN) injection 4 mg  4 mg Intravenous Q6H PRN Edwin Dada, MD   4 mg at 03/31/16 1311  . pantoprazole (PROTONIX) EC tablet 40 mg  40 mg Oral Daily Edwin Dada, MD   40 mg at 04/02/16 0954  . rosuvastatin (CRESTOR) tablet 20 mg  20 mg Oral q1800 Edwin Dada, MD   20 mg at 04/02/16 2336  . senna-docusate (Senokot-S) tablet 1 tablet  1 tablet Oral QHS PRN Edwin Dada, MD   1 tablet at 04/01/16 0109  . traMADol (ULTRAM) tablet 50 mg  50 mg Oral Q6H PRN Coralie Keens, MD   50 mg at 04/02/16 2335     Discharge Medications: Please see discharge summary for a list of discharge medications.  Relevant Imaging Results:  Relevant Lab Results:   Additional Information SSN: [place], [place]

## 2016-04-03 NOTE — Discharge Summary (Signed)
Physician Discharge Summary  Alexis York [id number] DOB: 1921-12-24 DOA: 03/28/2016  PCP: Alexis York  Admit date: 03/28/2016 Discharge date: 04/04/16 Admitted From: HOME Disposition:  SNF  Recommendations for Outpatient Follow-up:  1. Follow up with PCP in 1-2 weeks 2. Follow up with general surgery, Alexis York in 1-2 weeks  Home Health: No Equipment/Devices:No  Discharge Condition:Stable CODE STATUS:DNR Diet recommendation: soft/regular  Brief/Interim Summary 82 year old female with history of dementia, hyperlipidemia, GERD presented with abdominal pain 4 days with decreased oral intake and associated vomiting. CT of the abdomen and pelvis upon presentation revealed distended gallbladder with wall thickening and pericholecystic edema. General surgery was consulted to assist with management. Patient's family was initially hesitant to pursue surgery. The patient was treated medically with antibiotics (ceftriaxone) with clinical improvement. In the late afternoon on 03/30/2016, the patient's family agreed to laparoscopic cholecystectomy. On 03/31/2016, the patient underwent laparoscopic cholecystectomy. Her post operative course was unremarkable and her diet was advanced which she tolerated.  Discharge Diagnoses:   Acute Cholecystitis -03/29/16 CT abd showed Distended gallbladder with gallbladder wall thickening and pericholecystic edema. Calcified gallstone in the gallbladder neck. Common bile duct measured 10 mm -ceftriaxone started 5/23 -General surgery consulted -03/30/2016--daughter agreed to lap chole -03/31/16--lap chole--Alexis York -advance diet to soft--tolerated well  Dementia:  -Continue Namenda, Aricept and Depakote. Prn haldol for agitation.  Hyponatremia -Secondary to volume depletion -Improved with IVF  Elevated troponin:  Minimal elevation, asymptmoatic. -no chest pain -ECG--no concerning ischemic changes  Hypokalemia -repleted    Arthritis: -Continue celecoxib  Constipation -colace and bisacodyl  Deconditioning -PT eval-->SNF  Discharge Instructions      Discharge Instructions    Ambulatory referral to Alexis York    Complete by:  Alexis York   Please evaluate Alexis York for admission to Alexis York.  Disciplines requested:HOME HEALTH AID, NURSE, PT, OT  Services to provide: HELP WITH MEDICATIONS, HELP WITH MVMT, HELP WITH ADLS  Physician to follow patient's care (the person listed here will be responsible for signing ongoing orders): Alexis York  Requested Start of Care [id number]  I certify that this patient is under my care and that I, or a Nurse Practitioner or Physician's Assistant working with me, had a face-to-face encounter that meets the physician face-to-face requirements with patient on 03/28/16. The encounter with the patient was in whole, or in part for the following medical condition(s) which is the primary reason for home health care (List medical condition). DEMENTIA, AMBULATORY DYSFUNCTION.  Special Instructions:  Does the patient have Medicare or Medicaid?:  Yes  The encounter with the patient was in whole, or in part, for the following medical condition, which is the primary reason for home health care:  AMBULATORY DYSFCT, DEMENTIA  Reason for Medically Necessary Home Health Services:  Skilled Nursing- Change/Decline in Patient Status  My clinical findings support the need for the above services:  Unsafe ambulation due to balance issues  I certify that, based on my findings, the following services are medically necessary home health services:   Nursing Physical therapy    Further, I certify that my clinical findings support that this patient is homebound due to:  Unsafe ambulation due to balance issues     Diet - low sodium heart healthy    Complete by:  Alexis York      Diet - low sodium heart healthy    Complete by:  Alexis York      Increase activity slowly     Complete  by:  Alexis York      Increase activity slowly    Complete by:  Alexis York             Medication List    TAKE these medications        aspirin 81 MG tablet  Take 81 mg by mouth daily.     calcium-vitamin D 500-200 MG-UNIT tablet  Commonly known Alexis:  OSCAL WITH D  Take 1 tablet by mouth 2 (two) times daily.     celecoxib 200 MG capsule  Commonly known Alexis:  CELEBREX  Take 200 mg by mouth 2 (two) times daily.     divalproex 125 MG capsule  Commonly known Alexis:  DEPAKOTE SPRINKLE  Take 125 mg by mouth 2 (two) times daily.     donepezil 10 MG tablet  Commonly known Alexis:  ARICEPT  Take 1 tablet (10 mg total) by mouth daily.     memantine 28 MG Cp24 24 hr capsule  Commonly known Alexis:  NAMENDA XR  Take 1 capsule (28 mg total) by mouth daily.     omeprazole 20 MG capsule  Commonly known Alexis:  PRILOSEC  Take 20 mg by mouth daily.     rosuvastatin 20 MG tablet  Commonly known Alexis:  CRESTOR  Take 20 mg by mouth daily.     traMADol 50 MG tablet  Commonly known Alexis:  ULTRAM  Take 1 tablet (50 mg total) by mouth every 6 (six) hours Alexis needed for moderate pain.     Vitamin D (Ergocalciferol) 50000 units Caps capsule  Commonly known Alexis:  DRISDOL  Take 50,000 Units by mouth every Sunday.       Follow-up Information    Follow up with Alexis York.   Why:  [place], Physical Therapy, Occupational Therapy and aide   Contact information:   [address] [place]  [address] [phone]       Follow up with Alexis York In 2 weeks.   Specialty:  General Surgery   Contact information:   [address] [place]  [address] [phone]      Allergies  Allergen Reactions  . Other Itching and Rash  . Codeine Nausea And Vomiting  . Quinolones Other (See Comments)    Eye irritation, pt unsure  . Benzalkonium Chloride Rash    Pt unsure?    Consultations:  General  surgery   Procedures/Studies: Ct Chest W Contrast  03/29/2016  CLINICAL DATA:  82 year old female with upper back and abdominal pain for 3 days. EXAM: CT CHEST, ABDOMEN, AND PELVIS WITH CONTRAST TECHNIQUE: Multidetector CT imaging of the chest, abdomen and pelvis was performed following the standard protocol during bolus administration of intravenous contrast. CONTRAST:  1 ISOVUE-300 IOPAMIDOL (ISOVUE-300) INJECTION 61% COMPARISON:  None. FINDINGS: CT CHEST FINDINGS Mediastinum/Lymph Nodes: Tortuous atherosclerotic thoracic aorta without aneurysm or dissection. Dense coronary artery calcifications or stents. Heart size is normal. No pericardial effusion. No adenopathy. Lungs/Pleura: No pulmonary mass, infiltrate, or effusion. Tiny 3 mm subpleural nodule left upper lobe series 3, image 55. Scattered atelectasis in both lower lobes and right middle lobe. Musculoskeletal: Multiple compression deformities in the thoracic spine. T10 and T12 compression deformities with vertebral augmentation. T7 and T8 compression deformities without augmentation. Acuity of the T7 and T8 fracture is uncertain. Exaggerated thoracic kyphosis and multilevel degenerative change. CT ABDOMEN PELVIS FINDINGS Hepatobiliary: Distended gallbladder with gallbladder wall thickening and pericholecystic edema. Calcified gallstone in the  gallbladder neck. Common bile duct measures 10 mm, likely normal for age. No focal hepatic lesion. Pancreas: No mass, inflammatory changes, or other significant abnormality. Spleen: Within normal limits in size and appearance. Adrenals/Urinary Tract: No masses identified. No evidence of hydronephrosis. Multiple bilateral parapelvic cysts in both kidneys. Cortical cyst in the upper left kidney. Stomach/Bowel: No evidence of obstruction, inflammatory process, or abnormal fluid collections. Moderate stool burden. Scattered distal colonic diverticular without diverticulitis. Vascular/Lymphatic: No pathologically  enlarged lymph nodes. No evidence of abdominal aortic aneurysm. Dense atherosclerosis of the abdominal aorta and its branches, no aneurysm. Reproductive: Uterus is surgically absent. No adnexal mass. Streak artifact obscures detailed evaluation. Other: None. Musculoskeletal: Multilevel degenerative change throughout the lumbar spine, no compression deformity or acute abnormality. An bilateral hip arthroplasties. IMPRESSION: 1. CT findings consistent with acute cholecystitis. No biliary dilatation. 2. Multiple compression deformities throughout the thoracic spine, chronic at T10 and T12 with vertebral augmentation. T7 and T8 compression fractures of uncertain acuity. 3. Diffuse atherosclerosis including coronary artery calcifications. 4. Tiny 3 mm subpleural nodule in the left upper lobe, this is likely infectious or inflammatory. No follow-up needed if patient is low-risk. Non-contrast chest CT can be considered in 12 months if patient is high-risk. This recommendation follows the consensus statement: Guidelines for Management of Incidental Pulmonary Nodules Detected on CT Images:From the Fleischner Society 2017; published online before print (10.1148/radiol.IJ:2314499). Electronically Signed   By: Jeb Levering M.D.   On: 03/29/2016 02:56   Ct Abdomen Pelvis W Contrast  03/29/2016  CLINICAL DATA:  82 year old female with upper back and abdominal pain for 3 days. EXAM: CT CHEST, ABDOMEN, AND PELVIS WITH CONTRAST TECHNIQUE: Multidetector CT imaging of the chest, abdomen and pelvis was performed following the standard protocol during bolus administration of intravenous contrast. CONTRAST:  1 ISOVUE-300 IOPAMIDOL (ISOVUE-300) INJECTION 61% COMPARISON:  None. FINDINGS: CT CHEST FINDINGS Mediastinum/Lymph Nodes: Tortuous atherosclerotic thoracic aorta without aneurysm or dissection. Dense coronary artery calcifications or stents. Heart size is normal. No pericardial effusion. No adenopathy. Lungs/Pleura: No  pulmonary mass, infiltrate, or effusion. Tiny 3 mm subpleural nodule left upper lobe series 3, image 55. Scattered atelectasis in both lower lobes and right middle lobe. Musculoskeletal: Multiple compression deformities in the thoracic spine. T10 and T12 compression deformities with vertebral augmentation. T7 and T8 compression deformities without augmentation. Acuity of the T7 and T8 fracture is uncertain. Exaggerated thoracic kyphosis and multilevel degenerative change. CT ABDOMEN PELVIS FINDINGS Hepatobiliary: Distended gallbladder with gallbladder wall thickening and pericholecystic edema. Calcified gallstone in the gallbladder neck. Common bile duct measures 10 mm, likely normal for age. No focal hepatic lesion. Pancreas: No mass, inflammatory changes, or other significant abnormality. Spleen: Within normal limits in size and appearance. Adrenals/Urinary Tract: No masses identified. No evidence of hydronephrosis. Multiple bilateral parapelvic cysts in both kidneys. Cortical cyst in the upper left kidney. Stomach/Bowel: No evidence of obstruction, inflammatory process, or abnormal fluid collections. Moderate stool burden. Scattered distal colonic diverticular without diverticulitis. Vascular/Lymphatic: No pathologically enlarged lymph nodes. No evidence of abdominal aortic aneurysm. Dense atherosclerosis of the abdominal aorta and its branches, no aneurysm. Reproductive: Uterus is surgically absent. No adnexal mass. Streak artifact obscures detailed evaluation. Other: None. Musculoskeletal: Multilevel degenerative change throughout the lumbar spine, no compression deformity or acute abnormality. An bilateral hip arthroplasties. IMPRESSION: 1. CT findings consistent with acute cholecystitis. No biliary dilatation. 2. Multiple compression deformities throughout the thoracic spine, chronic at T10 and T12 with vertebral augmentation. T7 and T8 compression fractures of uncertain acuity.  3. Diffuse atherosclerosis  including coronary artery calcifications. 4. Tiny 3 mm subpleural nodule in the left upper lobe, this is likely infectious or inflammatory. No follow-up needed if patient is low-risk. Non-contrast chest CT can be considered in 12 months if patient is high-risk. This recommendation follows the consensus statement: Guidelines for Management of Incidental Pulmonary Nodules Detected on CT Images:From the Fleischner Society 2017; published online before print (10.1148/radiol.IJ:2314499). Electronically Signed   By: Jeb Levering M.D.   On: 03/29/2016 02:56       Discharge Exam: Filed Vitals:   04/03/16 2203 04/04/16 0607  BP: 131/61 131/59  Pulse: 87 72  Temp: 98.6 F (37 C) 99.2 F (37.3 C)  Resp: 18 20   Filed Vitals:   04/03/16 1000 04/03/16 1757 04/03/16 2203 04/04/16 0607  BP: 130/47 124/55 131/61 131/59  Pulse: 65 72 87 72  Temp: 98 F (36.7 C) 98 F (36.7 C) 98.6 F (37 C) 99.2 F (37.3 C)  TempSrc: Oral Oral Oral Oral  Resp: 18 18 18 20   Height:      Weight:      SpO2: 96% 96% 95% 96%    General: Pt is alert, awake, not in acute distress Cardiovascular: RRR, S1/S2 +, no rubs, no gallops Respiratory: Fine bibasilar crackles. No wheeze Abdominal: Soft, NT, ND, bowel sounds + Extremities: trace LE edema, no cyanosis   The results of significant diagnostics from this hospitalization (including imaging, microbiology, ancillary and laboratory) are listed below for reference.    Significant Diagnostic Studies: Ct Chest W Contrast  03/29/2016  CLINICAL DATA:  82 year old female with upper back and abdominal pain for 3 days. EXAM: CT CHEST, ABDOMEN, AND PELVIS WITH CONTRAST TECHNIQUE: Multidetector CT imaging of the chest, abdomen and pelvis was performed following the standard protocol during bolus administration of intravenous contrast. CONTRAST:  1 ISOVUE-300 IOPAMIDOL (ISOVUE-300) INJECTION 61% COMPARISON:  None. FINDINGS: CT CHEST FINDINGS Mediastinum/Lymph Nodes:  Tortuous atherosclerotic thoracic aorta without aneurysm or dissection. Dense coronary artery calcifications or stents. Heart size is normal. No pericardial effusion. No adenopathy. Lungs/Pleura: No pulmonary mass, infiltrate, or effusion. Tiny 3 mm subpleural nodule left upper lobe series 3, image 55. Scattered atelectasis in both lower lobes and right middle lobe. Musculoskeletal: Multiple compression deformities in the thoracic spine. T10 and T12 compression deformities with vertebral augmentation. T7 and T8 compression deformities without augmentation. Acuity of the T7 and T8 fracture is uncertain. Exaggerated thoracic kyphosis and multilevel degenerative change. CT ABDOMEN PELVIS FINDINGS Hepatobiliary: Distended gallbladder with gallbladder wall thickening and pericholecystic edema. Calcified gallstone in the gallbladder neck. Common bile duct measures 10 mm, likely normal for age. No focal hepatic lesion. Pancreas: No mass, inflammatory changes, or other significant abnormality. Spleen: Within normal limits in size and appearance. Adrenals/Urinary Tract: No masses identified. No evidence of hydronephrosis. Multiple bilateral parapelvic cysts in both kidneys. Cortical cyst in the upper left kidney. Stomach/Bowel: No evidence of obstruction, inflammatory process, or abnormal fluid collections. Moderate stool burden. Scattered distal colonic diverticular without diverticulitis. Vascular/Lymphatic: No pathologically enlarged lymph nodes. No evidence of abdominal aortic aneurysm. Dense atherosclerosis of the abdominal aorta and its branches, no aneurysm. Reproductive: Uterus is surgically absent. No adnexal mass. Streak artifact obscures detailed evaluation. Other: None. Musculoskeletal: Multilevel degenerative change throughout the lumbar spine, no compression deformity or acute abnormality. An bilateral hip arthroplasties. IMPRESSION: 1. CT findings consistent with acute cholecystitis. No biliary dilatation. 2.  Multiple compression deformities throughout the thoracic spine, chronic at T10 and T12 with vertebral  augmentation. T7 and T8 compression fractures of uncertain acuity. 3. Diffuse atherosclerosis including coronary artery calcifications. 4. Tiny 3 mm subpleural nodule in the left upper lobe, this is likely infectious or inflammatory. No follow-up needed if patient is low-risk. Non-contrast chest CT can be considered in 12 months if patient is high-risk. This recommendation follows the consensus statement: Guidelines for Management of Incidental Pulmonary Nodules Detected on CT Images:From the Fleischner Society 2017; published online before print (10.1148/radiol.SG:5268862). Electronically Signed   By: Jeb Levering M.D.   On: 03/29/2016 02:56   Ct Abdomen Pelvis W Contrast  03/29/2016  CLINICAL DATA:  82 year old female with upper back and abdominal pain for 3 days. EXAM: CT CHEST, ABDOMEN, AND PELVIS WITH CONTRAST TECHNIQUE: Multidetector CT imaging of the chest, abdomen and pelvis was performed following the standard protocol during bolus administration of intravenous contrast. CONTRAST:  1 ISOVUE-300 IOPAMIDOL (ISOVUE-300) INJECTION 61% COMPARISON:  None. FINDINGS: CT CHEST FINDINGS Mediastinum/Lymph Nodes: Tortuous atherosclerotic thoracic aorta without aneurysm or dissection. Dense coronary artery calcifications or stents. Heart size is normal. No pericardial effusion. No adenopathy. Lungs/Pleura: No pulmonary mass, infiltrate, or effusion. Tiny 3 mm subpleural nodule left upper lobe series 3, image 55. Scattered atelectasis in both lower lobes and right middle lobe. Musculoskeletal: Multiple compression deformities in the thoracic spine. T10 and T12 compression deformities with vertebral augmentation. T7 and T8 compression deformities without augmentation. Acuity of the T7 and T8 fracture is uncertain. Exaggerated thoracic kyphosis and multilevel degenerative change. CT ABDOMEN PELVIS FINDINGS  Hepatobiliary: Distended gallbladder with gallbladder wall thickening and pericholecystic edema. Calcified gallstone in the gallbladder neck. Common bile duct measures 10 mm, likely normal for age. No focal hepatic lesion. Pancreas: No mass, inflammatory changes, or other significant abnormality. Spleen: Within normal limits in size and appearance. Adrenals/Urinary Tract: No masses identified. No evidence of hydronephrosis. Multiple bilateral parapelvic cysts in both kidneys. Cortical cyst in the upper left kidney. Stomach/Bowel: No evidence of obstruction, inflammatory process, or abnormal fluid collections. Moderate stool burden. Scattered distal colonic diverticular without diverticulitis. Vascular/Lymphatic: No pathologically enlarged lymph nodes. No evidence of abdominal aortic aneurysm. Dense atherosclerosis of the abdominal aorta and its branches, no aneurysm. Reproductive: Uterus is surgically absent. No adnexal mass. Streak artifact obscures detailed evaluation. Other: None. Musculoskeletal: Multilevel degenerative change throughout the lumbar spine, no compression deformity or acute abnormality. An bilateral hip arthroplasties. IMPRESSION: 1. CT findings consistent with acute cholecystitis. No biliary dilatation. 2. Multiple compression deformities throughout the thoracic spine, chronic at T10 and T12 with vertebral augmentation. T7 and T8 compression fractures of uncertain acuity. 3. Diffuse atherosclerosis including coronary artery calcifications. 4. Tiny 3 mm subpleural nodule in the left upper lobe, this is likely infectious or inflammatory. No follow-up needed if patient is low-risk. Non-contrast chest CT can be considered in 12 months if patient is high-risk. This recommendation follows the consensus statement: Guidelines for Management of Incidental Pulmonary Nodules Detected on CT Images:From the Fleischner Society 2017; published online before print (10.1148/radiol.SG:5268862). Electronically  Signed   By: Jeb Levering M.D.   On: 03/29/2016 02:56     Microbiology: Recent Results (from the past 240 hour(s))  Surgical pcr screen     Status: None   Collection Time: 03/30/16  9:56 PM  Result Value Ref Range Status   MRSA, PCR NEGATIVE NEGATIVE Final   Staphylococcus aureus NEGATIVE NEGATIVE Final    Comment:        The Xpert SA Assay (FDA approved for NASAL specimens in patients over 21  years of age), is one component of a comprehensive surveillance program.  Test performance has been validated by [facility] for patients greater than or equal to 56 year old. It is not intended to diagnose infection nor to guide or monitor treatment.      Labs: Basic Metabolic Panel:  Recent Labs Lab 03/29/16 0637 03/31/16 0616 04/01/16 0510 04/02/16 0537 04/04/16 0513  NA 135 141 139 139 138  K 3.4* 3.1* 4.0 3.7 3.7  CL 101 114* 111 110 103  CO2 26 23 21* 23 27  GLUCOSE 85 87 126* 99 84  BUN 15 <5* <5* <5* 5*  CREATININE 0.71 0.67 0.62 0.64 0.64  CALCIUM 8.1* 7.5* 7.6* 7.7* 8.1*   Liver Function Tests:  Recent Labs Lab 03/28/16 1446 03/29/16 0637 03/31/16 0616 04/01/16 0510  AST 22 15 13* 41  ALT 17 13* 10* 21  ALKPHOS 75 63 59 66  BILITOT 1.4* 1.2 0.5 0.4  PROT 6.6 5.7* 4.7* 5.4*  ALBUMIN 3.0* 2.5* 2.0* 2.2*    Recent Labs Lab 03/28/16 1446  LIPASE 23   No results for input(s): AMMONIA in the last 168 hours. CBC:  Recent Labs Lab 03/28/16 1446 03/29/16 0637 03/31/16 0616 04/01/16 0510  WBC 15.4* 10.5 7.5 8.1  HGB 13.9 12.1 10.4* 11.9*  HCT 43.1 37.6 33.5* 37.6  MCV 91.9 93.8 95.2 92.4  PLT 243 200 185 237   Cardiac Enzymes:  Recent Labs Lab 03/28/16 2242 03/29/16 0637  TROPONINI 0.04* <0.03   BNP: Invalid input(s): POCBNP CBG: No results for input(s): GLUCAP in the last 168 hours.  Time coordinating discharge:  Greater than 30 minutes  Signed:  Naina Sleeper, DO [facility] Pager: [phone] 04/04/2016, 9:35 AM

## 2016-04-03 NOTE — Clinical Social Work Note (Signed)
Clinical Social Work Assessment  Patient Details  Name: Alexis York MRN: [id number] Date of Birth: [birth year]  Date of referral:  04/03/16               Reason for consult:  Facility Placement                Permission sought to share information with:  Facility Sport and exercise psychologist, Family Supports Permission granted to share information::  No (Patient is disoriented; completed assessment w/ daughter)  Name::     [place]::  [facility] SNFs  Relationship::  Daughter  Contact Information:  [phone]  Housing/Transportation Living arrangements for the past 2 months:  [place] of Information:  Adult Children Patient Interpreter Needed:  None Criminal Activity/Legal Involvement Pertinent to Current Situation/Hospitalization:  No - Comment as needed Significant Relationships:  Adult Children Lives with:  Adult Children Do you feel safe going back to the place where you live?  No Need for family participation in patient care:  Yes (Comment)  Care giving concerns:  CSW received referral for possible SNF placement at time of discharge. Patient is disoriented. CSW spoke with patient's daughter regarding PT recommendation of SNF placement at time of discharge. Per patient's daughter, patient lives with her normally but daughter cannot take care of patient since patient is unable to walk. Patient's daughter expressed understanding of PT recommendation and are agreeable to SNF placement at time of discharge. CSW to continue to follow and assist with discharge planning needs.   Social Worker assessment / plan:  CSW spoke with patient's daughter concerning possibility of rehab at [facility] before returning home.  Employment status:  Retired Forensic scientist:  Medicare PT Recommendations:  [place] / Referral to community resources:  [place]  Patient/Family's Response to care:  Patient's daughter recognizes  need for rehab before returning home and is agreeable to a SNF in [place]. Patient's daughter reported preference for [facility] since she has been there before. Other choices would be [facility] or [facility].  Patient/Family's Understanding of and Emotional Response to Diagnosis, Current Treatment, and Prognosis:  Patient/family is realistic regarding therapy needs. No questions/concerns about plan or treatment.    Emotional Assessment Appearance:  Appears stated age Attitude/Demeanor/Rapport:  Unable to Assess Affect (typically observed):  Unable to Assess Orientation:  Oriented to Self Alcohol / Substance use:  Not Applicable Psych involvement (Current and /or in the community):  No (Comment)  Discharge Needs  Concerns to be addressed:  Care Coordination Readmission within the last 30 days:  No Current discharge risk:  Physical Impairment Barriers to Discharge:  Continued Medical Work up   [employer], [place] 04/03/2016, 8:26 AM

## 2016-04-04 DIAGNOSIS — F0390 Unspecified dementia without behavioral disturbance: Secondary | ICD-10-CM | POA: Diagnosis not present

## 2016-04-04 DIAGNOSIS — E871 Hypo-osmolality and hyponatremia: Secondary | ICD-10-CM | POA: Diagnosis not present

## 2016-04-04 DIAGNOSIS — M179 Osteoarthritis of knee, unspecified: Secondary | ICD-10-CM | POA: Diagnosis not present

## 2016-04-04 DIAGNOSIS — M6281 Muscle weakness (generalized): Secondary | ICD-10-CM | POA: Diagnosis not present

## 2016-04-04 DIAGNOSIS — K5900 Constipation, unspecified: Secondary | ICD-10-CM | POA: Diagnosis not present

## 2016-04-04 DIAGNOSIS — R7989 Other specified abnormal findings of blood chemistry: Secondary | ICD-10-CM | POA: Diagnosis not present

## 2016-04-04 DIAGNOSIS — K819 Cholecystitis, unspecified: Secondary | ICD-10-CM | POA: Diagnosis not present

## 2016-04-04 DIAGNOSIS — R269 Unspecified abnormalities of gait and mobility: Secondary | ICD-10-CM | POA: Diagnosis not present

## 2016-04-04 DIAGNOSIS — R278 Other lack of coordination: Secondary | ICD-10-CM | POA: Diagnosis not present

## 2016-04-04 DIAGNOSIS — R2689 Other abnormalities of gait and mobility: Secondary | ICD-10-CM | POA: Diagnosis not present

## 2016-04-04 DIAGNOSIS — K810 Acute cholecystitis: Secondary | ICD-10-CM | POA: Diagnosis not present

## 2016-04-04 DIAGNOSIS — R1310 Dysphagia, unspecified: Secondary | ICD-10-CM | POA: Diagnosis not present

## 2016-04-04 DIAGNOSIS — F068 Other specified mental disorders due to known physiological condition: Secondary | ICD-10-CM | POA: Diagnosis not present

## 2016-04-04 DIAGNOSIS — M1990 Unspecified osteoarthritis, unspecified site: Secondary | ICD-10-CM | POA: Diagnosis not present

## 2016-04-04 LAB — BASIC METABOLIC PANEL
Anion gap: 8 (ref 5–15)
BUN: 5 mg/dL — ABNORMAL LOW (ref 6–20)
CHLORIDE: 103 mmol/L (ref 101–111)
CO2: 27 mmol/L (ref 22–32)
CREATININE: 0.64 mg/dL (ref 0.44–1.00)
Calcium: 8.1 mg/dL — ABNORMAL LOW (ref 8.9–10.3)
GFR calc non Af Amer: >60 mL/min (ref >60)
Glucose, Bld: 84 mg/dL (ref 65–99)
POTASSIUM: 3.7 mmol/L (ref 3.5–5.1)
SODIUM: 138 mmol/L (ref 135–145)

## 2016-04-04 NOTE — Clinical Social Work Placement (Signed)
   CLINICAL SOCIAL WORK PLACEMENT  NOTE  Date:  04/04/2016  Patient Details  Name: Alexis York MRN: [id number] Date of Birth: 05-02-1922  Clinical Social Work is seeking post-discharge placement for this patient at the [place] level of care (*CSW will initial, date and re-position this form in  chart as items are completed):  Yes   Patient/family provided with [facility] Work Department's list of facilities offering this level of care within the geographic area requested by the patient (or if unable, by the patient's family).  Yes   Patient/family informed of their freedom to choose among providers that offer the needed level of care, that participate in Medicare, Medicaid or managed care program needed by the patient, have an available bed and are willing to accept the patient.  Yes   Patient/family informed of [facility]'s ownership interest in [facility] and [facility], as well as of the fact that they are under no obligation to receive care at these facilities.  PASRR submitted to EDS on       PASRR number received on       Existing PASRR number confirmed on 04/03/16     FL2 transmitted to all facilities in geographic area requested by pt/family on 04/03/16     FL2 transmitted to all facilities within larger geographic area on       Patient informed that his/her managed care company has contracts with or will negotiate with certain facilities, including the following:        Yes   Patient/family informed of bed offers received.  Patient chooses bed at [place] [facility]     Physician recommends and patient chooses bed at      Patient to be transferred to [facility] at [place] on 04/04/16.  Patient to be transferred to facility by [facility]     Patient family notified on 04/04/16 of transfer.  Name of family member notified:  Ann     PHYSICIAN Please sign FL2     Additional Comment:     _______________________________________________ Benard Halsted, [place] 04/04/2016, 2:25 PM

## 2016-04-04 NOTE — Progress Notes (Signed)
Patient to be discharged to [facility]. PIV removed. [facility] transport arranged by CSW. Report called to facility.  Joellen Jersey, RN.

## 2016-04-04 NOTE — Care Management Important Message (Signed)
Important Message  Patient Details  Name: Alexis York MRN: [id number] Date of Birth: April 25, 1922   Medicare Important Message Given:  Yes    Alexis York [place] 04/04/2016, 10:03 AM

## 2016-04-04 NOTE — Progress Notes (Signed)
Patient will DC to: [facility] Anticipated DC date: 04/04/16 Family notified: Daughter at bedside Transport by: Domenica Reamer   Per MD patient ready for DC to [facility]. RN, patient, patient's family, and facility notified of DC. RN given number for report. DC packet on chart. Ambulance transport requested for patient.   CSW signing off.  Cedric Fishman, [place] Social Worker [phone]

## 2016-04-04 NOTE — Discharge Instructions (Signed)

## 2016-04-07 DIAGNOSIS — G301 Alzheimer's disease with late onset: Secondary | ICD-10-CM | POA: Diagnosis not present

## 2016-04-07 DIAGNOSIS — Z48815 Encounter for surgical aftercare following surgery on the digestive system: Secondary | ICD-10-CM | POA: Diagnosis not present

## 2016-04-07 DIAGNOSIS — K219 Gastro-esophageal reflux disease without esophagitis: Secondary | ICD-10-CM | POA: Diagnosis not present

## 2016-04-07 DIAGNOSIS — M179 Osteoarthritis of knee, unspecified: Secondary | ICD-10-CM | POA: Diagnosis not present

## 2016-04-07 DIAGNOSIS — M6281 Muscle weakness (generalized): Secondary | ICD-10-CM | POA: Diagnosis not present

## 2016-04-07 DIAGNOSIS — D649 Anemia, unspecified: Secondary | ICD-10-CM | POA: Diagnosis not present

## 2016-04-07 DIAGNOSIS — F0390 Unspecified dementia without behavioral disturbance: Secondary | ICD-10-CM | POA: Diagnosis not present

## 2016-04-07 DIAGNOSIS — R278 Other lack of coordination: Secondary | ICD-10-CM | POA: Diagnosis not present

## 2016-04-07 DIAGNOSIS — E785 Hyperlipidemia, unspecified: Secondary | ICD-10-CM | POA: Diagnosis not present

## 2016-04-07 DIAGNOSIS — E569 Vitamin deficiency, unspecified: Secondary | ICD-10-CM | POA: Diagnosis not present

## 2016-04-07 DIAGNOSIS — R5381 Other malaise: Secondary | ICD-10-CM | POA: Diagnosis not present

## 2016-04-07 DIAGNOSIS — R41841 Cognitive communication deficit: Secondary | ICD-10-CM | POA: Diagnosis not present

## 2016-04-07 DIAGNOSIS — R262 Difficulty in walking, not elsewhere classified: Secondary | ICD-10-CM | POA: Diagnosis not present

## 2016-04-07 DIAGNOSIS — R1312 Dysphagia, oropharyngeal phase: Secondary | ICD-10-CM | POA: Diagnosis not present

## 2016-04-07 DIAGNOSIS — K810 Acute cholecystitis: Secondary | ICD-10-CM | POA: Diagnosis not present

## 2016-04-07 DIAGNOSIS — M1990 Unspecified osteoarthritis, unspecified site: Secondary | ICD-10-CM | POA: Diagnosis not present

## 2016-04-07 DIAGNOSIS — R52 Pain, unspecified: Secondary | ICD-10-CM | POA: Diagnosis not present

## 2016-04-07 DIAGNOSIS — K819 Cholecystitis, unspecified: Secondary | ICD-10-CM | POA: Diagnosis not present

## 2016-04-07 DIAGNOSIS — R269 Unspecified abnormalities of gait and mobility: Secondary | ICD-10-CM | POA: Diagnosis not present

## 2016-04-07 DIAGNOSIS — R293 Abnormal posture: Secondary | ICD-10-CM | POA: Diagnosis not present

## 2016-04-07 DIAGNOSIS — R2689 Other abnormalities of gait and mobility: Secondary | ICD-10-CM | POA: Diagnosis not present

## 2016-04-07 DIAGNOSIS — F0630 Mood disorder due to known physiological condition, unspecified: Secondary | ICD-10-CM | POA: Diagnosis not present

## 2016-04-07 DIAGNOSIS — K8060 Calculus of gallbladder and bile duct with cholecystitis, unspecified, without obstruction: Secondary | ICD-10-CM | POA: Diagnosis not present

## 2016-04-08 DIAGNOSIS — D649 Anemia, unspecified: Secondary | ICD-10-CM | POA: Diagnosis not present

## 2016-04-08 DIAGNOSIS — G301 Alzheimer's disease with late onset: Secondary | ICD-10-CM | POA: Diagnosis not present

## 2016-04-08 DIAGNOSIS — R5381 Other malaise: Secondary | ICD-10-CM | POA: Diagnosis not present

## 2016-05-23 DIAGNOSIS — D649 Anemia, unspecified: Secondary | ICD-10-CM | POA: Diagnosis not present

## 2016-05-23 DIAGNOSIS — G301 Alzheimer's disease with late onset: Secondary | ICD-10-CM | POA: Diagnosis not present

## 2016-05-23 DIAGNOSIS — R5381 Other malaise: Secondary | ICD-10-CM | POA: Diagnosis not present

## 2016-05-25 DIAGNOSIS — F0390 Unspecified dementia without behavioral disturbance: Secondary | ICD-10-CM | POA: Diagnosis not present

## 2016-05-25 DIAGNOSIS — R2689 Other abnormalities of gait and mobility: Secondary | ICD-10-CM | POA: Diagnosis not present

## 2016-05-25 DIAGNOSIS — M150 Primary generalized (osteo)arthritis: Secondary | ICD-10-CM | POA: Diagnosis not present

## 2016-05-25 DIAGNOSIS — M810 Age-related osteoporosis without current pathological fracture: Secondary | ICD-10-CM | POA: Diagnosis not present

## 2016-05-25 DIAGNOSIS — E785 Hyperlipidemia, unspecified: Secondary | ICD-10-CM | POA: Diagnosis not present

## 2016-05-25 DIAGNOSIS — K5660 Unspecified intestinal obstruction: Secondary | ICD-10-CM | POA: Diagnosis not present

## 2016-05-25 DIAGNOSIS — Z853 Personal history of malignant neoplasm of breast: Secondary | ICD-10-CM | POA: Diagnosis not present

## 2016-05-25 DIAGNOSIS — K219 Gastro-esophageal reflux disease without esophagitis: Secondary | ICD-10-CM | POA: Diagnosis not present

## 2016-05-26 DIAGNOSIS — M810 Age-related osteoporosis without current pathological fracture: Secondary | ICD-10-CM | POA: Diagnosis not present

## 2016-05-26 DIAGNOSIS — K5660 Unspecified intestinal obstruction: Secondary | ICD-10-CM | POA: Diagnosis not present

## 2016-05-26 DIAGNOSIS — F0390 Unspecified dementia without behavioral disturbance: Secondary | ICD-10-CM | POA: Diagnosis not present

## 2016-05-26 DIAGNOSIS — R2689 Other abnormalities of gait and mobility: Secondary | ICD-10-CM | POA: Diagnosis not present

## 2016-05-26 DIAGNOSIS — E785 Hyperlipidemia, unspecified: Secondary | ICD-10-CM | POA: Diagnosis not present

## 2016-05-26 DIAGNOSIS — M150 Primary generalized (osteo)arthritis: Secondary | ICD-10-CM | POA: Diagnosis not present

## 2016-05-28 DIAGNOSIS — F0390 Unspecified dementia without behavioral disturbance: Secondary | ICD-10-CM | POA: Diagnosis not present

## 2016-05-28 DIAGNOSIS — M810 Age-related osteoporosis without current pathological fracture: Secondary | ICD-10-CM | POA: Diagnosis not present

## 2016-05-28 DIAGNOSIS — R2689 Other abnormalities of gait and mobility: Secondary | ICD-10-CM | POA: Diagnosis not present

## 2016-05-28 DIAGNOSIS — K5660 Unspecified intestinal obstruction: Secondary | ICD-10-CM | POA: Diagnosis not present

## 2016-05-28 DIAGNOSIS — M150 Primary generalized (osteo)arthritis: Secondary | ICD-10-CM | POA: Diagnosis not present

## 2016-05-28 DIAGNOSIS — E785 Hyperlipidemia, unspecified: Secondary | ICD-10-CM | POA: Diagnosis not present

## 2016-05-30 DIAGNOSIS — Z96652 Presence of left artificial knee joint: Secondary | ICD-10-CM | POA: Diagnosis not present

## 2016-05-30 DIAGNOSIS — M545 Low back pain: Secondary | ICD-10-CM | POA: Diagnosis not present

## 2016-05-30 DIAGNOSIS — M25561 Pain in right knee: Secondary | ICD-10-CM | POA: Diagnosis not present

## 2016-05-31 DIAGNOSIS — M810 Age-related osteoporosis without current pathological fracture: Secondary | ICD-10-CM | POA: Diagnosis not present

## 2016-05-31 DIAGNOSIS — F0390 Unspecified dementia without behavioral disturbance: Secondary | ICD-10-CM | POA: Diagnosis not present

## 2016-05-31 DIAGNOSIS — K5660 Unspecified intestinal obstruction: Secondary | ICD-10-CM | POA: Diagnosis not present

## 2016-05-31 DIAGNOSIS — M150 Primary generalized (osteo)arthritis: Secondary | ICD-10-CM | POA: Diagnosis not present

## 2016-05-31 DIAGNOSIS — E785 Hyperlipidemia, unspecified: Secondary | ICD-10-CM | POA: Diagnosis not present

## 2016-05-31 DIAGNOSIS — R2689 Other abnormalities of gait and mobility: Secondary | ICD-10-CM | POA: Diagnosis not present

## 2016-06-01 DIAGNOSIS — M150 Primary generalized (osteo)arthritis: Secondary | ICD-10-CM | POA: Diagnosis not present

## 2016-06-01 DIAGNOSIS — F0390 Unspecified dementia without behavioral disturbance: Secondary | ICD-10-CM | POA: Diagnosis not present

## 2016-06-01 DIAGNOSIS — E785 Hyperlipidemia, unspecified: Secondary | ICD-10-CM | POA: Diagnosis not present

## 2016-06-01 DIAGNOSIS — R2689 Other abnormalities of gait and mobility: Secondary | ICD-10-CM | POA: Diagnosis not present

## 2016-06-01 DIAGNOSIS — K5660 Unspecified intestinal obstruction: Secondary | ICD-10-CM | POA: Diagnosis not present

## 2016-06-01 DIAGNOSIS — M810 Age-related osteoporosis without current pathological fracture: Secondary | ICD-10-CM | POA: Diagnosis not present

## 2016-06-02 DIAGNOSIS — M810 Age-related osteoporosis without current pathological fracture: Secondary | ICD-10-CM | POA: Diagnosis not present

## 2016-06-02 DIAGNOSIS — M150 Primary generalized (osteo)arthritis: Secondary | ICD-10-CM | POA: Diagnosis not present

## 2016-06-02 DIAGNOSIS — F0390 Unspecified dementia without behavioral disturbance: Secondary | ICD-10-CM | POA: Diagnosis not present

## 2016-06-02 DIAGNOSIS — R2689 Other abnormalities of gait and mobility: Secondary | ICD-10-CM | POA: Diagnosis not present

## 2016-06-02 DIAGNOSIS — K5660 Unspecified intestinal obstruction: Secondary | ICD-10-CM | POA: Diagnosis not present

## 2016-06-02 DIAGNOSIS — E785 Hyperlipidemia, unspecified: Secondary | ICD-10-CM | POA: Diagnosis not present

## 2016-06-03 DIAGNOSIS — R2689 Other abnormalities of gait and mobility: Secondary | ICD-10-CM | POA: Diagnosis not present

## 2016-06-03 DIAGNOSIS — M810 Age-related osteoporosis without current pathological fracture: Secondary | ICD-10-CM | POA: Diagnosis not present

## 2016-06-03 DIAGNOSIS — K5660 Unspecified intestinal obstruction: Secondary | ICD-10-CM | POA: Diagnosis not present

## 2016-06-03 DIAGNOSIS — E785 Hyperlipidemia, unspecified: Secondary | ICD-10-CM | POA: Diagnosis not present

## 2016-06-03 DIAGNOSIS — F0390 Unspecified dementia without behavioral disturbance: Secondary | ICD-10-CM | POA: Diagnosis not present

## 2016-06-03 DIAGNOSIS — M150 Primary generalized (osteo)arthritis: Secondary | ICD-10-CM | POA: Diagnosis not present

## 2016-06-06 DIAGNOSIS — M810 Age-related osteoporosis without current pathological fracture: Secondary | ICD-10-CM | POA: Diagnosis not present

## 2016-06-06 DIAGNOSIS — M150 Primary generalized (osteo)arthritis: Secondary | ICD-10-CM | POA: Diagnosis not present

## 2016-06-06 DIAGNOSIS — F0390 Unspecified dementia without behavioral disturbance: Secondary | ICD-10-CM | POA: Diagnosis not present

## 2016-06-06 DIAGNOSIS — K5660 Unspecified intestinal obstruction: Secondary | ICD-10-CM | POA: Diagnosis not present

## 2016-06-06 DIAGNOSIS — E785 Hyperlipidemia, unspecified: Secondary | ICD-10-CM | POA: Diagnosis not present

## 2016-06-06 DIAGNOSIS — R2689 Other abnormalities of gait and mobility: Secondary | ICD-10-CM | POA: Diagnosis not present

## 2016-06-07 DIAGNOSIS — M810 Age-related osteoporosis without current pathological fracture: Secondary | ICD-10-CM | POA: Diagnosis not present

## 2016-06-07 DIAGNOSIS — K5660 Unspecified intestinal obstruction: Secondary | ICD-10-CM | POA: Diagnosis not present

## 2016-06-07 DIAGNOSIS — M150 Primary generalized (osteo)arthritis: Secondary | ICD-10-CM | POA: Diagnosis not present

## 2016-06-07 DIAGNOSIS — R2689 Other abnormalities of gait and mobility: Secondary | ICD-10-CM | POA: Diagnosis not present

## 2016-06-07 DIAGNOSIS — E785 Hyperlipidemia, unspecified: Secondary | ICD-10-CM | POA: Diagnosis not present

## 2016-06-07 DIAGNOSIS — F0390 Unspecified dementia without behavioral disturbance: Secondary | ICD-10-CM | POA: Diagnosis not present

## 2016-06-08 DIAGNOSIS — M150 Primary generalized (osteo)arthritis: Secondary | ICD-10-CM | POA: Diagnosis not present

## 2016-06-08 DIAGNOSIS — K5660 Unspecified intestinal obstruction: Secondary | ICD-10-CM | POA: Diagnosis not present

## 2016-06-08 DIAGNOSIS — R2689 Other abnormalities of gait and mobility: Secondary | ICD-10-CM | POA: Diagnosis not present

## 2016-06-08 DIAGNOSIS — M810 Age-related osteoporosis without current pathological fracture: Secondary | ICD-10-CM | POA: Diagnosis not present

## 2016-06-08 DIAGNOSIS — F0390 Unspecified dementia without behavioral disturbance: Secondary | ICD-10-CM | POA: Diagnosis not present

## 2016-06-08 DIAGNOSIS — E785 Hyperlipidemia, unspecified: Secondary | ICD-10-CM | POA: Diagnosis not present

## 2016-06-09 DIAGNOSIS — F0390 Unspecified dementia without behavioral disturbance: Secondary | ICD-10-CM | POA: Diagnosis not present

## 2016-06-09 DIAGNOSIS — E785 Hyperlipidemia, unspecified: Secondary | ICD-10-CM | POA: Diagnosis not present

## 2016-06-09 DIAGNOSIS — M150 Primary generalized (osteo)arthritis: Secondary | ICD-10-CM | POA: Diagnosis not present

## 2016-06-09 DIAGNOSIS — M810 Age-related osteoporosis without current pathological fracture: Secondary | ICD-10-CM | POA: Diagnosis not present

## 2016-06-09 DIAGNOSIS — R2689 Other abnormalities of gait and mobility: Secondary | ICD-10-CM | POA: Diagnosis not present

## 2016-06-09 DIAGNOSIS — K5660 Unspecified intestinal obstruction: Secondary | ICD-10-CM | POA: Diagnosis not present

## 2016-06-13 DIAGNOSIS — R2689 Other abnormalities of gait and mobility: Secondary | ICD-10-CM | POA: Diagnosis not present

## 2016-06-13 DIAGNOSIS — K5660 Unspecified intestinal obstruction: Secondary | ICD-10-CM | POA: Diagnosis not present

## 2016-06-13 DIAGNOSIS — F0390 Unspecified dementia without behavioral disturbance: Secondary | ICD-10-CM | POA: Diagnosis not present

## 2016-06-13 DIAGNOSIS — E785 Hyperlipidemia, unspecified: Secondary | ICD-10-CM | POA: Diagnosis not present

## 2016-06-13 DIAGNOSIS — M150 Primary generalized (osteo)arthritis: Secondary | ICD-10-CM | POA: Diagnosis not present

## 2016-06-13 DIAGNOSIS — M810 Age-related osteoporosis without current pathological fracture: Secondary | ICD-10-CM | POA: Diagnosis not present

## 2016-06-14 DIAGNOSIS — R2689 Other abnormalities of gait and mobility: Secondary | ICD-10-CM | POA: Diagnosis not present

## 2016-06-14 DIAGNOSIS — M150 Primary generalized (osteo)arthritis: Secondary | ICD-10-CM | POA: Diagnosis not present

## 2016-06-14 DIAGNOSIS — M810 Age-related osteoporosis without current pathological fracture: Secondary | ICD-10-CM | POA: Diagnosis not present

## 2016-06-14 DIAGNOSIS — K5660 Unspecified intestinal obstruction: Secondary | ICD-10-CM | POA: Diagnosis not present

## 2016-06-14 DIAGNOSIS — E785 Hyperlipidemia, unspecified: Secondary | ICD-10-CM | POA: Diagnosis not present

## 2016-06-14 DIAGNOSIS — F0390 Unspecified dementia without behavioral disturbance: Secondary | ICD-10-CM | POA: Diagnosis not present

## 2016-06-15 DIAGNOSIS — F0390 Unspecified dementia without behavioral disturbance: Secondary | ICD-10-CM | POA: Diagnosis not present

## 2016-06-15 DIAGNOSIS — R2689 Other abnormalities of gait and mobility: Secondary | ICD-10-CM | POA: Diagnosis not present

## 2016-06-15 DIAGNOSIS — M150 Primary generalized (osteo)arthritis: Secondary | ICD-10-CM | POA: Diagnosis not present

## 2016-06-15 DIAGNOSIS — K5660 Unspecified intestinal obstruction: Secondary | ICD-10-CM | POA: Diagnosis not present

## 2016-06-15 DIAGNOSIS — E785 Hyperlipidemia, unspecified: Secondary | ICD-10-CM | POA: Diagnosis not present

## 2016-06-15 DIAGNOSIS — M810 Age-related osteoporosis without current pathological fracture: Secondary | ICD-10-CM | POA: Diagnosis not present

## 2016-06-16 DIAGNOSIS — F0390 Unspecified dementia without behavioral disturbance: Secondary | ICD-10-CM | POA: Diagnosis not present

## 2016-06-16 DIAGNOSIS — E785 Hyperlipidemia, unspecified: Secondary | ICD-10-CM | POA: Diagnosis not present

## 2016-06-16 DIAGNOSIS — R2689 Other abnormalities of gait and mobility: Secondary | ICD-10-CM | POA: Diagnosis not present

## 2016-06-16 DIAGNOSIS — M150 Primary generalized (osteo)arthritis: Secondary | ICD-10-CM | POA: Diagnosis not present

## 2016-06-16 DIAGNOSIS — K5660 Unspecified intestinal obstruction: Secondary | ICD-10-CM | POA: Diagnosis not present

## 2016-06-16 DIAGNOSIS — M810 Age-related osteoporosis without current pathological fracture: Secondary | ICD-10-CM | POA: Diagnosis not present

## 2016-06-20 DIAGNOSIS — M810 Age-related osteoporosis without current pathological fracture: Secondary | ICD-10-CM | POA: Diagnosis not present

## 2016-06-20 DIAGNOSIS — E785 Hyperlipidemia, unspecified: Secondary | ICD-10-CM | POA: Diagnosis not present

## 2016-06-20 DIAGNOSIS — K5660 Unspecified intestinal obstruction: Secondary | ICD-10-CM | POA: Diagnosis not present

## 2016-06-20 DIAGNOSIS — R2689 Other abnormalities of gait and mobility: Secondary | ICD-10-CM | POA: Diagnosis not present

## 2016-06-20 DIAGNOSIS — M150 Primary generalized (osteo)arthritis: Secondary | ICD-10-CM | POA: Diagnosis not present

## 2016-06-20 DIAGNOSIS — F0390 Unspecified dementia without behavioral disturbance: Secondary | ICD-10-CM | POA: Diagnosis not present

## 2016-06-23 DIAGNOSIS — G309 Alzheimer's disease, unspecified: Secondary | ICD-10-CM | POA: Diagnosis not present

## 2016-06-23 DIAGNOSIS — R2689 Other abnormalities of gait and mobility: Secondary | ICD-10-CM | POA: Diagnosis not present

## 2016-06-23 DIAGNOSIS — M170 Bilateral primary osteoarthritis of knee: Secondary | ICD-10-CM | POA: Diagnosis not present

## 2016-06-23 DIAGNOSIS — Z6825 Body mass index (BMI) 25.0-25.9, adult: Secondary | ICD-10-CM | POA: Diagnosis not present

## 2016-06-23 DIAGNOSIS — Z9049 Acquired absence of other specified parts of digestive tract: Secondary | ICD-10-CM | POA: Diagnosis not present

## 2016-06-29 DIAGNOSIS — M150 Primary generalized (osteo)arthritis: Secondary | ICD-10-CM | POA: Diagnosis not present

## 2016-06-29 DIAGNOSIS — K5660 Unspecified intestinal obstruction: Secondary | ICD-10-CM | POA: Diagnosis not present

## 2016-06-29 DIAGNOSIS — M810 Age-related osteoporosis without current pathological fracture: Secondary | ICD-10-CM | POA: Diagnosis not present

## 2016-06-29 DIAGNOSIS — R2689 Other abnormalities of gait and mobility: Secondary | ICD-10-CM | POA: Diagnosis not present

## 2016-06-29 DIAGNOSIS — F0390 Unspecified dementia without behavioral disturbance: Secondary | ICD-10-CM | POA: Diagnosis not present

## 2016-06-29 DIAGNOSIS — E785 Hyperlipidemia, unspecified: Secondary | ICD-10-CM | POA: Diagnosis not present

## 2016-07-01 DIAGNOSIS — M6281 Muscle weakness (generalized): Secondary | ICD-10-CM | POA: Diagnosis not present

## 2016-07-01 DIAGNOSIS — I872 Venous insufficiency (chronic) (peripheral): Secondary | ICD-10-CM | POA: Diagnosis not present

## 2016-07-01 DIAGNOSIS — F5104 Psychophysiologic insomnia: Secondary | ICD-10-CM | POA: Diagnosis not present

## 2016-07-01 DIAGNOSIS — R0609 Other forms of dyspnea: Secondary | ICD-10-CM | POA: Diagnosis not present

## 2016-07-01 DIAGNOSIS — R269 Unspecified abnormalities of gait and mobility: Secondary | ICD-10-CM | POA: Diagnosis not present

## 2016-07-01 DIAGNOSIS — G309 Alzheimer's disease, unspecified: Secondary | ICD-10-CM | POA: Diagnosis not present

## 2016-07-01 DIAGNOSIS — M170 Bilateral primary osteoarthritis of knee: Secondary | ICD-10-CM | POA: Diagnosis not present

## 2016-07-01 DIAGNOSIS — L89609 Pressure ulcer of unspecified heel, unspecified stage: Secondary | ICD-10-CM | POA: Diagnosis not present

## 2016-07-20 DIAGNOSIS — Z85828 Personal history of other malignant neoplasm of skin: Secondary | ICD-10-CM | POA: Diagnosis not present

## 2016-07-20 DIAGNOSIS — L720 Epidermal cyst: Secondary | ICD-10-CM | POA: Diagnosis not present

## 2016-07-26 DIAGNOSIS — R531 Weakness: Secondary | ICD-10-CM | POA: Diagnosis not present

## 2016-08-02 DIAGNOSIS — R531 Weakness: Secondary | ICD-10-CM | POA: Diagnosis not present

## 2016-08-06 DIAGNOSIS — Z87891 Personal history of nicotine dependence: Secondary | ICD-10-CM | POA: Diagnosis not present

## 2016-08-06 DIAGNOSIS — Z7982 Long term (current) use of aspirin: Secondary | ICD-10-CM | POA: Diagnosis not present

## 2016-08-06 DIAGNOSIS — I872 Venous insufficiency (chronic) (peripheral): Secondary | ICD-10-CM | POA: Diagnosis not present

## 2016-08-06 DIAGNOSIS — M810 Age-related osteoporosis without current pathological fracture: Secondary | ICD-10-CM | POA: Diagnosis not present

## 2016-08-06 DIAGNOSIS — M170 Bilateral primary osteoarthritis of knee: Secondary | ICD-10-CM | POA: Diagnosis not present

## 2016-08-06 DIAGNOSIS — G308 Other Alzheimer's disease: Secondary | ICD-10-CM | POA: Diagnosis not present

## 2016-08-06 DIAGNOSIS — Z9181 History of falling: Secondary | ICD-10-CM | POA: Diagnosis not present

## 2016-08-06 DIAGNOSIS — M4800 Spinal stenosis, site unspecified: Secondary | ICD-10-CM | POA: Diagnosis not present

## 2016-08-06 DIAGNOSIS — M6281 Muscle weakness (generalized): Secondary | ICD-10-CM | POA: Diagnosis not present

## 2016-08-06 DIAGNOSIS — G309 Alzheimer's disease, unspecified: Secondary | ICD-10-CM | POA: Diagnosis not present

## 2016-08-06 DIAGNOSIS — L89621 Pressure ulcer of left heel, stage 1: Secondary | ICD-10-CM | POA: Diagnosis not present

## 2016-08-08 DIAGNOSIS — G309 Alzheimer's disease, unspecified: Secondary | ICD-10-CM | POA: Diagnosis not present

## 2016-08-08 DIAGNOSIS — M4800 Spinal stenosis, site unspecified: Secondary | ICD-10-CM | POA: Diagnosis not present

## 2016-08-08 DIAGNOSIS — I872 Venous insufficiency (chronic) (peripheral): Secondary | ICD-10-CM | POA: Diagnosis not present

## 2016-08-08 DIAGNOSIS — M170 Bilateral primary osteoarthritis of knee: Secondary | ICD-10-CM | POA: Diagnosis not present

## 2016-08-08 DIAGNOSIS — L89621 Pressure ulcer of left heel, stage 1: Secondary | ICD-10-CM | POA: Diagnosis not present

## 2016-08-08 DIAGNOSIS — M810 Age-related osteoporosis without current pathological fracture: Secondary | ICD-10-CM | POA: Diagnosis not present

## 2016-08-09 DIAGNOSIS — I872 Venous insufficiency (chronic) (peripheral): Secondary | ICD-10-CM | POA: Diagnosis not present

## 2016-08-09 DIAGNOSIS — M170 Bilateral primary osteoarthritis of knee: Secondary | ICD-10-CM | POA: Diagnosis not present

## 2016-08-09 DIAGNOSIS — M810 Age-related osteoporosis without current pathological fracture: Secondary | ICD-10-CM | POA: Diagnosis not present

## 2016-08-09 DIAGNOSIS — M4800 Spinal stenosis, site unspecified: Secondary | ICD-10-CM | POA: Diagnosis not present

## 2016-08-09 DIAGNOSIS — G309 Alzheimer's disease, unspecified: Secondary | ICD-10-CM | POA: Diagnosis not present

## 2016-08-09 DIAGNOSIS — L89621 Pressure ulcer of left heel, stage 1: Secondary | ICD-10-CM | POA: Diagnosis not present

## 2016-08-11 DIAGNOSIS — I872 Venous insufficiency (chronic) (peripheral): Secondary | ICD-10-CM | POA: Diagnosis not present

## 2016-08-11 DIAGNOSIS — L89621 Pressure ulcer of left heel, stage 1: Secondary | ICD-10-CM | POA: Diagnosis not present

## 2016-08-11 DIAGNOSIS — M170 Bilateral primary osteoarthritis of knee: Secondary | ICD-10-CM | POA: Diagnosis not present

## 2016-08-11 DIAGNOSIS — M4800 Spinal stenosis, site unspecified: Secondary | ICD-10-CM | POA: Diagnosis not present

## 2016-08-11 DIAGNOSIS — G309 Alzheimer's disease, unspecified: Secondary | ICD-10-CM | POA: Diagnosis not present

## 2016-08-11 DIAGNOSIS — M810 Age-related osteoporosis without current pathological fracture: Secondary | ICD-10-CM | POA: Diagnosis not present

## 2016-08-12 DIAGNOSIS — L89621 Pressure ulcer of left heel, stage 1: Secondary | ICD-10-CM | POA: Diagnosis not present

## 2016-08-12 DIAGNOSIS — I872 Venous insufficiency (chronic) (peripheral): Secondary | ICD-10-CM | POA: Diagnosis not present

## 2016-08-12 DIAGNOSIS — G309 Alzheimer's disease, unspecified: Secondary | ICD-10-CM | POA: Diagnosis not present

## 2016-08-12 DIAGNOSIS — M4800 Spinal stenosis, site unspecified: Secondary | ICD-10-CM | POA: Diagnosis not present

## 2016-08-12 DIAGNOSIS — M810 Age-related osteoporosis without current pathological fracture: Secondary | ICD-10-CM | POA: Diagnosis not present

## 2016-08-12 DIAGNOSIS — M170 Bilateral primary osteoarthritis of knee: Secondary | ICD-10-CM | POA: Diagnosis not present

## 2016-08-15 DIAGNOSIS — L89621 Pressure ulcer of left heel, stage 1: Secondary | ICD-10-CM | POA: Diagnosis not present

## 2016-08-15 DIAGNOSIS — G309 Alzheimer's disease, unspecified: Secondary | ICD-10-CM | POA: Diagnosis not present

## 2016-08-15 DIAGNOSIS — M4800 Spinal stenosis, site unspecified: Secondary | ICD-10-CM | POA: Diagnosis not present

## 2016-08-15 DIAGNOSIS — M170 Bilateral primary osteoarthritis of knee: Secondary | ICD-10-CM | POA: Diagnosis not present

## 2016-08-15 DIAGNOSIS — M810 Age-related osteoporosis without current pathological fracture: Secondary | ICD-10-CM | POA: Diagnosis not present

## 2016-08-15 DIAGNOSIS — I872 Venous insufficiency (chronic) (peripheral): Secondary | ICD-10-CM | POA: Diagnosis not present

## 2016-08-16 DIAGNOSIS — L89621 Pressure ulcer of left heel, stage 1: Secondary | ICD-10-CM | POA: Diagnosis not present

## 2016-08-16 DIAGNOSIS — M170 Bilateral primary osteoarthritis of knee: Secondary | ICD-10-CM | POA: Diagnosis not present

## 2016-08-16 DIAGNOSIS — M4800 Spinal stenosis, site unspecified: Secondary | ICD-10-CM | POA: Diagnosis not present

## 2016-08-16 DIAGNOSIS — I872 Venous insufficiency (chronic) (peripheral): Secondary | ICD-10-CM | POA: Diagnosis not present

## 2016-08-16 DIAGNOSIS — M810 Age-related osteoporosis without current pathological fracture: Secondary | ICD-10-CM | POA: Diagnosis not present

## 2016-08-16 DIAGNOSIS — G309 Alzheimer's disease, unspecified: Secondary | ICD-10-CM | POA: Diagnosis not present

## 2016-08-17 DIAGNOSIS — M170 Bilateral primary osteoarthritis of knee: Secondary | ICD-10-CM | POA: Diagnosis not present

## 2016-08-17 DIAGNOSIS — I872 Venous insufficiency (chronic) (peripheral): Secondary | ICD-10-CM | POA: Diagnosis not present

## 2016-08-17 DIAGNOSIS — G309 Alzheimer's disease, unspecified: Secondary | ICD-10-CM | POA: Diagnosis not present

## 2016-08-17 DIAGNOSIS — M4800 Spinal stenosis, site unspecified: Secondary | ICD-10-CM | POA: Diagnosis not present

## 2016-08-17 DIAGNOSIS — L89621 Pressure ulcer of left heel, stage 1: Secondary | ICD-10-CM | POA: Diagnosis not present

## 2016-08-17 DIAGNOSIS — M810 Age-related osteoporosis without current pathological fracture: Secondary | ICD-10-CM | POA: Diagnosis not present

## 2016-08-18 DIAGNOSIS — G309 Alzheimer's disease, unspecified: Secondary | ICD-10-CM | POA: Diagnosis not present

## 2016-08-18 DIAGNOSIS — M170 Bilateral primary osteoarthritis of knee: Secondary | ICD-10-CM | POA: Diagnosis not present

## 2016-08-18 DIAGNOSIS — M810 Age-related osteoporosis without current pathological fracture: Secondary | ICD-10-CM | POA: Diagnosis not present

## 2016-08-18 DIAGNOSIS — M4800 Spinal stenosis, site unspecified: Secondary | ICD-10-CM | POA: Diagnosis not present

## 2016-08-18 DIAGNOSIS — L89621 Pressure ulcer of left heel, stage 1: Secondary | ICD-10-CM | POA: Diagnosis not present

## 2016-08-18 DIAGNOSIS — I872 Venous insufficiency (chronic) (peripheral): Secondary | ICD-10-CM | POA: Diagnosis not present

## 2016-08-19 DIAGNOSIS — M4800 Spinal stenosis, site unspecified: Secondary | ICD-10-CM | POA: Diagnosis not present

## 2016-08-19 DIAGNOSIS — L98499 Non-pressure chronic ulcer of skin of other sites with unspecified severity: Secondary | ICD-10-CM | POA: Diagnosis not present

## 2016-08-19 DIAGNOSIS — I872 Venous insufficiency (chronic) (peripheral): Secondary | ICD-10-CM | POA: Diagnosis not present

## 2016-08-19 DIAGNOSIS — Z85828 Personal history of other malignant neoplasm of skin: Secondary | ICD-10-CM | POA: Diagnosis not present

## 2016-08-19 DIAGNOSIS — M810 Age-related osteoporosis without current pathological fracture: Secondary | ICD-10-CM | POA: Diagnosis not present

## 2016-08-19 DIAGNOSIS — M170 Bilateral primary osteoarthritis of knee: Secondary | ICD-10-CM | POA: Diagnosis not present

## 2016-08-19 DIAGNOSIS — L89621 Pressure ulcer of left heel, stage 1: Secondary | ICD-10-CM | POA: Diagnosis not present

## 2016-08-19 DIAGNOSIS — G309 Alzheimer's disease, unspecified: Secondary | ICD-10-CM | POA: Diagnosis not present

## 2016-08-22 DIAGNOSIS — M810 Age-related osteoporosis without current pathological fracture: Secondary | ICD-10-CM | POA: Diagnosis not present

## 2016-08-22 DIAGNOSIS — M170 Bilateral primary osteoarthritis of knee: Secondary | ICD-10-CM | POA: Diagnosis not present

## 2016-08-22 DIAGNOSIS — G309 Alzheimer's disease, unspecified: Secondary | ICD-10-CM | POA: Diagnosis not present

## 2016-08-22 DIAGNOSIS — M4800 Spinal stenosis, site unspecified: Secondary | ICD-10-CM | POA: Diagnosis not present

## 2016-08-22 DIAGNOSIS — I872 Venous insufficiency (chronic) (peripheral): Secondary | ICD-10-CM | POA: Diagnosis not present

## 2016-08-22 DIAGNOSIS — L89621 Pressure ulcer of left heel, stage 1: Secondary | ICD-10-CM | POA: Diagnosis not present

## 2016-08-23 DIAGNOSIS — M810 Age-related osteoporosis without current pathological fracture: Secondary | ICD-10-CM | POA: Diagnosis not present

## 2016-08-23 DIAGNOSIS — M4800 Spinal stenosis, site unspecified: Secondary | ICD-10-CM | POA: Diagnosis not present

## 2016-08-23 DIAGNOSIS — I872 Venous insufficiency (chronic) (peripheral): Secondary | ICD-10-CM | POA: Diagnosis not present

## 2016-08-23 DIAGNOSIS — L89621 Pressure ulcer of left heel, stage 1: Secondary | ICD-10-CM | POA: Diagnosis not present

## 2016-08-23 DIAGNOSIS — G309 Alzheimer's disease, unspecified: Secondary | ICD-10-CM | POA: Diagnosis not present

## 2016-08-23 DIAGNOSIS — M170 Bilateral primary osteoarthritis of knee: Secondary | ICD-10-CM | POA: Diagnosis not present

## 2016-08-25 DIAGNOSIS — L89621 Pressure ulcer of left heel, stage 1: Secondary | ICD-10-CM | POA: Diagnosis not present

## 2016-08-25 DIAGNOSIS — G309 Alzheimer's disease, unspecified: Secondary | ICD-10-CM | POA: Diagnosis not present

## 2016-08-25 DIAGNOSIS — M170 Bilateral primary osteoarthritis of knee: Secondary | ICD-10-CM | POA: Diagnosis not present

## 2016-08-25 DIAGNOSIS — M810 Age-related osteoporosis without current pathological fracture: Secondary | ICD-10-CM | POA: Diagnosis not present

## 2016-08-25 DIAGNOSIS — M4800 Spinal stenosis, site unspecified: Secondary | ICD-10-CM | POA: Diagnosis not present

## 2016-08-25 DIAGNOSIS — I872 Venous insufficiency (chronic) (peripheral): Secondary | ICD-10-CM | POA: Diagnosis not present

## 2016-08-30 ENCOUNTER — Encounter: Payer: Self-pay | Admitting: Nurse Practitioner

## 2016-08-30 ENCOUNTER — Ambulatory Visit (INDEPENDENT_AMBULATORY_CARE_PROVIDER_SITE_OTHER): Payer: Medicare Other | Admitting: Nurse Practitioner

## 2016-08-30 VITALS — BP 131/83 | HR 73 | Ht 65.0 in | Wt 139.4 lb

## 2016-08-30 DIAGNOSIS — F0390 Unspecified dementia without behavioral disturbance: Secondary | ICD-10-CM | POA: Diagnosis not present

## 2016-08-30 DIAGNOSIS — R269 Unspecified abnormalities of gait and mobility: Secondary | ICD-10-CM

## 2016-08-30 MED ORDER — MEMANTINE HCL ER 28 MG PO CP24: 28.0000 mg | ORAL_CAPSULE | Freq: Every day | ORAL | 3 refills | Status: AC

## 2016-08-30 NOTE — Patient Instructions (Signed)
Aricept at  10 mg  dose will refill Continue Namenda at current dose will refill Continue Depakote sprinkle for agitation Memory score MMSE 17/30 . Follow-up in 6 months

## 2016-08-30 NOTE — Progress Notes (Signed)
[facility]  PATIENT: Alexis York DOB: [id number]   REASON FOR VISIT: Follow-up for dementia HISTORY FROM:daughter   HISTORY OF PRESENT ILLNESS: 02/29/16 CDMrs. York lost her husband Alexis York exactly one month today. He fell due to a heart attack , but was on the floor for hours before an ambulance was called. A neighbor noted that 2 newspapers were in the front yard and no garbage was out. A poker buddy was calling tor Alexis York  on the phone , but Alexis York wouldn't open the door when a neighbor rang the bell. That set a phone tree in motion and finally her daughter was alarmed and came to the house. . This happened on a 02/28/23 and on 2023/03/01 he died.  Alexis York has now fallen several times, without any mechanical barrier. Her PCP discontinued the Aricept due to her lack of appetite.  Her daughter has moved in and she is being asked about " where  Is Alexis York /" daily, she seems to be in different epoches of her life. Short term memory is gone. She is not declined in comparison to last visit, but continuously over 3-4 years.  Now living in her marital home of many years she is no longer able to bath unassisted, dress unassisted, she needs supervision for medication intake, and she has shown that she is no longer able to appropriate respond to corners on phone or at the door. Her daughter has also noted her progressive gait instability.  I will initiate a Education officer, museum consultation with a goal to also enroll home health for a safety evaluation, possibly physical therapy at the home for gait stabilization, and it may be beneficial if she gets a new hearing aid.  The patient is not interested in the newest and smallest model  - UPDATE 10/24/2017CM Alexis York, 82 year old female returns for follow-up with her daughter who lives in her home. She has a history of dementia and is currently on Aricept and Namenda. Appetite is fair per daughter and she sleeps okay at night. She is  currently in a wheelchair she has had more mobility issues but no falls fortunately. She is going to be evaluated for the ACES program which will allow her daughter to have some respite. Her son from [place] and his family have also moved in to the home and there is limited privacy however the daughter is thankful that she does get some help. Alexis. York has Depakote to take for agitation. She can feed herself requires more assistance with other ADLs she returns for reevaluation. She has had gallbladder removal since last seen .    REVIEW OF SYSTEMS: Full 14 system review of systems performed and notable only for those listed, all others are neg:  Constitutional: neg  Cardiovascular: neg Ear/Nose/Throat: neg  Skin: neg Eyes: neg Respiratory: neg Gastroitestinal: neg  Hematology/Lymphatic: neg  Endocrine: neg Musculoskeletal:neg Allergy/Immunology: neg Neurological: neg Psychiatric: neg Sleep : neg   ALLERGIES: Allergies  Allergen Reactions  . Other Itching and Rash  . Codeine Nausea And Vomiting  . Quinolones Other (See Comments)    Eye irritation, pt unsure  . Benzalkonium Chloride Rash    Pt unsure?    HOME MEDICATIONS: Outpatient Medications Prior to Visit  Medication Sig Dispense Refill  . aspirin 81 MG tablet Take 81 mg by mouth daily.    . calcium-vitamin D (OSCAL WITH D) 500-200 MG-UNIT per tablet Take 1 tablet by mouth 2 (two) times daily.     Marland Kitchen  celecoxib (CELEBREX) 200 MG capsule Take 200 mg by mouth 2 (two) times daily.    . divalproex (DEPAKOTE SPRINKLE) 125 MG capsule Take 125 mg by mouth 2 (two) times daily.    Marland Kitchen donepezil (ARICEPT) 10 MG tablet Take 1 tablet (10 mg total) by mouth daily. 90 tablet 3  . memantine (NAMENDA XR) 28 MG CP24 24 hr capsule Take 1 capsule (28 mg total) by mouth daily. 90 capsule 3  . omeprazole (PRILOSEC) 20 MG capsule Take 20 mg by mouth daily.    . rosuvastatin (CRESTOR) 20 MG tablet Take 20 mg by mouth daily.    . traMADol  (ULTRAM) 50 MG tablet Take 1 tablet (50 mg total) by mouth every 6 (six) hours as needed for moderate pain. 15 tablet 0  . Vitamin D, Ergocalciferol, (DRISDOL) 50000 UNITS CAPS Take 50,000 Units by mouth every Sunday.      No facility-administered medications prior to visit.     PAST MEDICAL HISTORY: Past Medical History:  Diagnosis Date  . Arthritis   . Cancer [facility]) 1973   left mastectomy  . GERD (gastroesophageal reflux disease)   . Hypercholesteremia   . Memory impairment    short term memory loss  . Osteoporosis   . Primary osteoarthritis of left knee 10/27/2014  . Vitamin D deficiency     PAST SURGICAL HISTORY: Past Surgical History:  Procedure Laterality Date  . ABDOMINAL HYSTERECTOMY  1975  . APPENDECTOMY     teenager  . BREAST SURGERY  1975   mastectomy  . CHOLECYSTECTOMY N/A 03/31/2016   Procedure: LAPAROSCOPIC CHOLECYSTECTOMY;  Surgeon: Coralie Keens, MD;  Location: [facility];  Service: General;  Laterality: N/A;  . JOINT REPLACEMENT  15 yr/10 yr ago   bilateral hip replacements  . MASTECTOMY Left   . TONSILLECTOMY    . TOTAL KNEE ARTHROPLASTY Left 10/27/2014   dr Mardelle Matte  . TOTAL KNEE ARTHROPLASTY Left 10/27/2014   Procedure: TOTAL KNEE ARTHROPLASTY;  Surgeon: Johnny Bridge, MD;  Location: [facility];  Service: Orthopedics;  Laterality: Left;    FAMILY HISTORY: Family History  Problem Relation Age of Onset  . Dementia Mother   . Cancer - Colon Mother   . Cancer - Ovarian Sister     SOCIAL HISTORY: Social History   Social History  . Marital status: Married    Spouse name: Alexis York   . Number of children: N/A  . Years of education: N/A   Occupational History  . Retired     Social History Main Topics  . Smoking status: Former Smoker    Packs/day: 1.00    Years: 22.00    Types: Cigarettes    Quit date: 12/20/1971  . Smokeless tobacco: Never Used  . Alcohol use Yes     Comment: occasional  . Drug use: No  . Sexual activity: Not on file   Other  Topics Concern  . Not on file   Social History Narrative   Patient lives at home with husband Alexis York.      PHYSICAL EXAM  Vitals:   08/30/16 1437  BP: 131/83  Pulse: 73  Weight: 139 lb 6.4 oz (63.2 kg)  Height: 5\' 5"  (1.651 m)   Body mass index is 23.2 kg/m. Generalized: Well developed, in no acute distress  Head: normocephalic and atraumatic,. Oropharynx benign  Neck: Supple, no carotid bruits  Musculoskeletal: No deformity   Neurological examination   Mentation: Alert , Follows all commands speech and language fluent.   MMSE -  Mini Mental State Exam 08/30/2016 02/29/2016 10/22/2015  Orientation to time 0 2 0  Orientation to Place 2 2 3   Registration 3 3 3   Attention/ Calculation 3 2 3   Recall 0 0 1  Language- name 2 objects 2 2 2   Language- repeat 1 1 0  Language- follow 3 step command 3 3 2   Language- read & follow direction 1 1 1   Write a sentence 1 1 1   Copy design 1 0 1  Total score 17 17 17    Cranial nerve II-XII: Pupils were equal round reactive to light extraocular movements were full, visual field were full on confrontational test. Facial sensation and strength were normal. hearing was intact to finger rubbing bilaterally. Uvula tongue midline. head turning and shoulder shrug were normal and symmetric.Tongue protrusion into cheek strength was normal. Motor: normal bulk and tone, full strength in the BUE, BLE, fine finger movements normal, no pronator drift. No focal weakness Sensory: normal and symmetric to light touch, pinprick, and  Vibration, proprioception  Coordination: finger-nose-finger,  no dysmetria Reflexes: 1+ upper lower and symmetric,  plantar responses were flexor bilaterally. Gait and Station:  seated in wheelchair not ambulated  DIAGNOSTIC DATA (LABS, IMAGING, TESTING) - I reviewed patient records, labs, notes, testing and imaging myself where available.  Lab Results  Component Value Date   WBC 8.1 04/01/2016   HGB 11.9 (L) 04/01/2016     HCT 37.6 04/01/2016   MCV 92.4 04/01/2016   PLT 237 04/01/2016      Component Value Date/Time   NA 138 04/04/2016 0513   K 3.7 04/04/2016 0513   CL 103 04/04/2016 0513   CO2 27 04/04/2016 0513   GLUCOSE 84 04/04/2016 0513   BUN 5 (L) 04/04/2016 0513   CREATININE 0.64 04/04/2016 0513   CALCIUM 8.1 (L) 04/04/2016 0513   PROT 5.4 (L) 04/01/2016 0510   ALBUMIN 2.2 (L) 04/01/2016 0510   AST 41 04/01/2016 0510   ALT 21 04/01/2016 0510   ALKPHOS 66 04/01/2016 0510   BILITOT 0.4 04/01/2016 0510   GFRNONAA >60 04/04/2016 0513   GFRAA >60 04/04/2016 0513    ASSESSMENT AND PLAN  82 y.o. year old female  has a past medical history of Dementia currently on Aricept and Namenda. Patient no longer drives she is total care except for feeding. She is to be evaluated for the ACES program to allow daughter some respite. PLAN:  Aricept at  10 mg  dose will refill Continue Namenda at current dose will refill Continue Depakote sprinkle for agitation if needed Memory score is stable MMSE 17/30 . Follow-up in 6 months  Dennie Bible, [facility], [facility], APRN  [facility] [address], [place], [place] [address] [phone]

## 2016-08-30 NOTE — Progress Notes (Signed)
I agree with the assessment and plan as directed by NP .The patient is known to me .   Chalsey Leeth, MD  

## 2016-08-31 DIAGNOSIS — M810 Age-related osteoporosis without current pathological fracture: Secondary | ICD-10-CM | POA: Diagnosis not present

## 2016-08-31 DIAGNOSIS — G309 Alzheimer's disease, unspecified: Secondary | ICD-10-CM | POA: Diagnosis not present

## 2016-08-31 DIAGNOSIS — M4800 Spinal stenosis, site unspecified: Secondary | ICD-10-CM | POA: Diagnosis not present

## 2016-08-31 DIAGNOSIS — M170 Bilateral primary osteoarthritis of knee: Secondary | ICD-10-CM | POA: Diagnosis not present

## 2016-08-31 DIAGNOSIS — I872 Venous insufficiency (chronic) (peripheral): Secondary | ICD-10-CM | POA: Diagnosis not present

## 2016-08-31 DIAGNOSIS — L89621 Pressure ulcer of left heel, stage 1: Secondary | ICD-10-CM | POA: Diagnosis not present

## 2016-09-02 DIAGNOSIS — R531 Weakness: Secondary | ICD-10-CM | POA: Diagnosis not present

## 2016-09-23 DIAGNOSIS — L98499 Non-pressure chronic ulcer of skin of other sites with unspecified severity: Secondary | ICD-10-CM | POA: Diagnosis not present

## 2016-09-23 DIAGNOSIS — Z85828 Personal history of other malignant neoplasm of skin: Secondary | ICD-10-CM | POA: Diagnosis not present

## 2016-10-10 DIAGNOSIS — E782 Mixed hyperlipidemia: Secondary | ICD-10-CM | POA: Diagnosis not present

## 2016-10-10 DIAGNOSIS — E559 Vitamin D deficiency, unspecified: Secondary | ICD-10-CM | POA: Diagnosis not present

## 2016-10-11 DIAGNOSIS — Z79899 Other long term (current) drug therapy: Secondary | ICD-10-CM | POA: Diagnosis not present

## 2016-10-17 DIAGNOSIS — Z0000 Encounter for general adult medical examination without abnormal findings: Secondary | ICD-10-CM | POA: Diagnosis not present

## 2016-10-17 DIAGNOSIS — C189 Malignant neoplasm of colon, unspecified: Secondary | ICD-10-CM | POA: Diagnosis not present

## 2016-10-17 DIAGNOSIS — F5104 Psychophysiologic insomnia: Secondary | ICD-10-CM | POA: Diagnosis not present

## 2016-10-17 DIAGNOSIS — M6281 Muscle weakness (generalized): Secondary | ICD-10-CM | POA: Diagnosis not present

## 2016-10-17 DIAGNOSIS — M810 Age-related osteoporosis without current pathological fracture: Secondary | ICD-10-CM | POA: Diagnosis not present

## 2016-10-17 DIAGNOSIS — E782 Mixed hyperlipidemia: Secondary | ICD-10-CM | POA: Diagnosis not present

## 2016-10-17 DIAGNOSIS — M170 Bilateral primary osteoarthritis of knee: Secondary | ICD-10-CM | POA: Diagnosis not present

## 2016-10-17 DIAGNOSIS — G308 Other Alzheimer's disease: Secondary | ICD-10-CM | POA: Diagnosis not present

## 2016-10-17 DIAGNOSIS — Z1389 Encounter for screening for other disorder: Secondary | ICD-10-CM | POA: Diagnosis not present

## 2016-10-20 ENCOUNTER — Ambulatory Visit: Payer: Medicare Other | Admitting: Nurse Practitioner

## 2016-10-28 DIAGNOSIS — R531 Weakness: Secondary | ICD-10-CM | POA: Diagnosis not present

## 2016-11-13 ENCOUNTER — Other Ambulatory Visit: Payer: Self-pay | Admitting: Neurology

## 2016-11-13 DIAGNOSIS — F0280 Dementia in other diseases classified elsewhere without behavioral disturbance: Secondary | ICD-10-CM

## 2016-11-13 DIAGNOSIS — G301 Alzheimer's disease with late onset: Principal | ICD-10-CM

## 2016-11-16 DIAGNOSIS — R531 Weakness: Secondary | ICD-10-CM | POA: Diagnosis not present

## 2016-12-07 DIAGNOSIS — R531 Weakness: Secondary | ICD-10-CM | POA: Diagnosis not present

## 2017-02-24 ENCOUNTER — Emergency Department (HOSPITAL_COMMUNITY)
Admission: EM | Admit: 2017-02-24 | Discharge: 2017-02-24 | Disposition: A | Discharge status: Home / Self Care | Payer: Medicare Other | Attending: Emergency Medicine | Admitting: Emergency Medicine

## 2017-02-24 ENCOUNTER — Encounter (HOSPITAL_COMMUNITY): Payer: Self-pay | Admitting: *Deleted

## 2017-02-24 DIAGNOSIS — Z87891 Personal history of nicotine dependence: Secondary | ICD-10-CM | POA: Diagnosis not present

## 2017-02-24 DIAGNOSIS — Z7982 Long term (current) use of aspirin: Secondary | ICD-10-CM | POA: Insufficient documentation

## 2017-02-24 DIAGNOSIS — Z79899 Other long term (current) drug therapy: Secondary | ICD-10-CM | POA: Insufficient documentation

## 2017-02-24 DIAGNOSIS — N390 Urinary tract infection, site not specified: Secondary | ICD-10-CM | POA: Diagnosis not present

## 2017-02-24 DIAGNOSIS — R531 Weakness: Secondary | ICD-10-CM | POA: Diagnosis present

## 2017-02-24 DIAGNOSIS — Z96652 Presence of left artificial knee joint: Secondary | ICD-10-CM | POA: Diagnosis not present

## 2017-02-24 DIAGNOSIS — Z853 Personal history of malignant neoplasm of breast: Secondary | ICD-10-CM | POA: Insufficient documentation

## 2017-02-24 LAB — URINALYSIS, ROUTINE W REFLEX MICROSCOPIC
Bilirubin Urine: NEGATIVE
Glucose, UA: NEGATIVE mg/dL
Hgb urine dipstick: NEGATIVE
KETONES UR: 20 mg/dL — AB
Nitrite: POSITIVE — AB
PROTEIN: NEGATIVE mg/dL
Specific Gravity, Urine: 1.02 (ref 1.005–1.030)
pH: 6 (ref 5.0–8.0)

## 2017-02-24 LAB — BASIC METABOLIC PANEL
ANION GAP: 10 (ref 5–15)
BUN: 20 mg/dL (ref 6–20)
CALCIUM: 9.7 mg/dL (ref 8.9–10.3)
CHLORIDE: 99 mmol/L — AB (ref 101–111)
CO2: 29 mmol/L (ref 22–32)
Creatinine, Ser: 0.69 mg/dL (ref 0.44–1.00)
GFR calc Af Amer: >60 mL/min (ref >60)
GFR calc non Af Amer: >60 mL/min (ref >60)
GLUCOSE: 104 mg/dL — AB (ref 65–99)
POTASSIUM: 4 mmol/L (ref 3.5–5.1)
Sodium: 138 mmol/L (ref 135–145)

## 2017-02-24 LAB — CBC
HEMATOCRIT: 45.2 % (ref 36.0–46.0)
HEMOGLOBIN: 14.5 g/dL (ref 12.0–15.0)
MCH: 29.6 pg (ref 26.0–34.0)
MCHC: 32.1 g/dL (ref 30.0–36.0)
MCV: 92.2 fL (ref 78.0–100.0)
Platelets: 232 10*3/uL (ref 150–400)
RBC: 4.9 MIL/uL (ref 3.87–5.11)
RDW: 14 % (ref 11.5–15.5)
WBC: 8.9 10*3/uL (ref 4.0–10.5)

## 2017-02-24 LAB — CBG MONITORING, ED: Glucose-Capillary: 111 mg/dL — ABNORMAL HIGH (ref 65–99)

## 2017-02-24 MED ORDER — CEPHALEXIN 500 MG PO CAPS: 500.0000 mg | ORAL_CAPSULE | Freq: Four times a day (QID) | ORAL | 0 refills | Status: DC

## 2017-02-24 MED ORDER — SODIUM CHLORIDE 0.9 % IV BOLUS (SEPSIS)
500.0000 mL | Freq: Once | INTRAVENOUS | Status: AC
  Administered 2017-02-24: 500 mL via INTRAVENOUS

## 2017-02-24 MED ORDER — DEXTROSE 5 % IV SOLN
1.0000 g | Freq: Once | INTRAVENOUS | Status: AC
  Administered 2017-02-24: 1 g via INTRAVENOUS
  Filled 2017-02-24: qty 10

## 2017-02-24 MED ORDER — ONDANSETRON HCL 4 MG/2ML IJ SOLN
4.0000 mg | Freq: Once | INTRAMUSCULAR | Status: AC
  Administered 2017-02-24: 4 mg via INTRAVENOUS
  Filled 2017-02-24: qty 2

## 2017-02-24 NOTE — ED Notes (Signed)
Pt reports that she does not feel well and has been that way for the past 2 days. Family advised the pt vomited a lot but the pt states she only vomited a little and the pt is more tired then usual. Pt does advise she feels better.

## 2017-02-24 NOTE — ED Triage Notes (Signed)
Per pts family- pt has been saying that she is "very tired" pt also reports abdominal pain and nausea. Pt MAE well. Dementia at baseline

## 2017-02-24 NOTE — ED Provider Notes (Signed)
[facility] DEPT Provider Note   CSN: [id number] Arrival date & time: 02/24/17  1350     History   Chief Complaint Chief Complaint  Patient presents with  . Weakness    HPI Alexis York is a 83 y.o. female.  She presents for evaluation of weakness, abdominal pain and nausea.  She has not had any fever, chills, vomiting or diarrhea.  She is unable to give any history.  Level 5 caveat-poor historian  HPI  Past Medical History:  Diagnosis Date  . Arthritis   . Cancer [facility]) 1973   left mastectomy  . GERD (gastroesophageal reflux disease)   . Hypercholesteremia   . Memory impairment    short term memory loss  . Osteoporosis   . Primary osteoarthritis of left knee 10/27/2014  . Vitamin D deficiency     Patient Active Problem List   Diagnosis Date Noted  . Cholecystitis 03/29/2016  . Hyponatremia 03/29/2016  . Elevated troponin 03/29/2016  . Acute cholecystitis 03/29/2016  . Primary osteoarthritis of left knee 10/27/2014  . Knee osteoarthritis 10/27/2014  . Multifactorial gait disorder 10/23/2013  . Dementia arising in the senium and presenium 10/23/2013  . Senile dementia, uncomplicated 08/65/7846  . Abnormality of gait 04/30/2013    Past Surgical History:  Procedure Laterality Date  . ABDOMINAL HYSTERECTOMY  1975  . APPENDECTOMY     teenager  . BREAST SURGERY  1975   mastectomy  . CHOLECYSTECTOMY N/A 03/31/2016   Procedure: LAPAROSCOPIC CHOLECYSTECTOMY;  Surgeon: Coralie Keens, MD;  Location: [facility];  Service: General;  Laterality: N/A;  . JOINT REPLACEMENT  15 yr/10 yr ago   bilateral hip replacements  . MASTECTOMY Left   . TONSILLECTOMY    . TOTAL KNEE ARTHROPLASTY Left 10/27/2014   dr Mardelle Matte  . TOTAL KNEE ARTHROPLASTY Left 10/27/2014   Procedure: TOTAL KNEE ARTHROPLASTY;  Surgeon: Johnny Bridge, MD;  Location: [facility];  Service: Orthopedics;  Laterality: Left;    OB History    No data available       Home Medications    Prior to  Admission medications   Medication Sig Start Date End Date Taking? Authorizing Provider  aspirin 81 MG tablet Take 81 mg by mouth daily.    Historical Provider, MD  calcium-vitamin D (OSCAL WITH D) 500-200 MG-UNIT per tablet Take 1 tablet by mouth 2 (two) times daily.     Historical Provider, MD  celecoxib (CELEBREX) 200 MG capsule Take 200 mg by mouth 2 (two) times daily.    Historical Provider, MD  cephALEXin (KEFLEX) 500 MG capsule Take 1 capsule (500 mg total) by mouth 4 (four) times daily. 02/24/17   Daleen Bo, MD  divalproex (DEPAKOTE SPRINKLE) 125 MG capsule Take 125 mg by mouth 2 (two) times daily.    Historical Provider, MD  donepezil (ARICEPT) 10 MG tablet Take 1 tablet (10 mg total) by mouth daily. 02/29/16   Asencion Partridge Dohmeier, MD  memantine (NAMENDA XR) 28 MG CP24 24 hr capsule Take 1 capsule (28 mg total) by mouth daily. 08/30/16   Dennie Bible, NP  omeprazole (PRILOSEC) 20 MG capsule Take 20 mg by mouth daily.    Historical Provider, MD  rosuvastatin (CRESTOR) 20 MG tablet Take 20 mg by mouth daily.    Historical Provider, MD  traMADol (ULTRAM) 50 MG tablet Take 1 tablet (50 mg total) by mouth every 6 (six) hours as needed for moderate pain. 04/02/16   Orson Eva, MD  Vitamin D, Ergocalciferol, (DRISDOL)  50000 UNITS CAPS Take 50,000 Units by mouth every Sunday.     Historical Provider, MD    Family History Family History  Problem Relation Age of Onset  . Dementia Mother   . Cancer - Colon Mother   . Cancer - Ovarian Sister     Social History Social History  Substance Use Topics  . Smoking status: Former Smoker    Packs/day: 1.00    Years: 22.00    Types: Cigarettes    Quit date: 12/20/1971  . Smokeless tobacco: Never Used  . Alcohol use Yes     Comment: occasional     Allergies   Other; Codeine; Quinolones; and Benzalkonium chloride   Review of Systems Review of Systems  Unable to perform ROS: Mental status change     Physical Exam Updated Vital  Signs BP (!) 120/59   Pulse 62   Temp 97.7 F (36.5 C) (Oral)   Resp 18   SpO2 96%   Physical Exam  Constitutional: She appears well-developed.  Frail, elderly  HENT:  Head: Normocephalic and atraumatic.  Eyes: Conjunctivae and EOM are normal. Pupils are equal, round, and reactive to light.  Neck: Normal range of motion and phonation normal. Neck supple.  Cardiovascular: Normal rate and regular rhythm.   Pulmonary/Chest: Effort normal and breath sounds normal. No respiratory distress. She exhibits no tenderness.  Abdominal: Soft. She exhibits no distension. There is no tenderness. There is no guarding.  Musculoskeletal: Normal range of motion.  Neurological: She is alert. She exhibits normal muscle tone.  No dysarthria or aphasia.  Skin: Skin is warm and dry.  Psychiatric: She has a normal mood and affect. Her behavior is normal.  Happy and conversant  Nursing note and vitals reviewed.    ED Treatments / Results  Labs (all labs ordered are listed, but only abnormal results are displayed) Labs Reviewed  BASIC METABOLIC PANEL - Abnormal; Notable for the following:       Result Value   Chloride 99 (*)    Glucose, Bld 104 (*)    All other components within normal limits  URINALYSIS, ROUTINE W REFLEX MICROSCOPIC - Abnormal; Notable for the following:    APPearance HAZY (*)    Ketones, ur 20 (*)    Nitrite POSITIVE (*)    Leukocytes, UA TRACE (*)    Bacteria, UA MANY (*)    Squamous Epithelial / LPF 6-30 (*)    All other components within normal limits  CBG MONITORING, ED - Abnormal; Notable for the following:    Glucose-Capillary 111 (*)    All other components within normal limits  CBC    EKG  EKG Interpretation  Date/Time:  Friday February 24 2017 14:00:08 EDT Ventricular Rate:  68 PR Interval:  162 QRS Duration: 70 QT Interval:  414 QTC Calculation: 440 R Axis:   78 Text Interpretation:  Normal sinus rhythm with sinus arrhythmia Normal ECG since last tracing no  significant change Confirmed by Eulis Foster  MD, Tiffanee Mcnee ([id number]) on 02/24/2017 8:03:09 PM       Radiology No results found.  Procedures Procedures (including critical care time)  Medications Ordered in ED Medications  sodium chloride 0.9 % bolus 500 mL (0 mLs Intravenous Stopped 02/24/17 2237)  cefTRIAXone (ROCEPHIN) 1 g in dextrose 5 % 50 mL IVPB (0 g Intravenous Stopped 02/24/17 2237)  ondansetron (ZOFRAN) injection 4 mg (4 mg Intravenous Given 02/24/17 2110)     Initial Impression / Assessment and Plan / ED Course  I have reviewed the triage vital signs and the nursing notes.  Pertinent labs & imaging results that were available during my care of the patient were reviewed by me and considered in my medical decision making (see chart for details).     Medications  sodium chloride 0.9 % bolus 500 mL (0 mLs Intravenous Stopped 02/24/17 2237)  cefTRIAXone (ROCEPHIN) 1 g in dextrose 5 % 50 mL IVPB (0 g Intravenous Stopped 02/24/17 2237)  ondansetron (ZOFRAN) injection 4 mg (4 mg Intravenous Given 02/24/17 2110)    Patient Vitals for the past 24 hrs:  BP Temp Temp src Pulse Resp SpO2  02/24/17 2245 (!) 120/59 - - 62 18 96 %  02/24/17 2230 (!) 114/45 - - 65 19 98 %  02/24/17 2215 (!) 112/47 - - 63 20 98 %  02/24/17 2200 (!) 121/50 - - 62 18 95 %  02/24/17 2145 (!) 118/53 - - 69 16 97 %  02/24/17 2130 (!) 113/44 - - 69 17 96 %  02/24/17 2115 (!) 132/57 - - 63 15 96 %  02/24/17 2100 (!) 170/113 - - 70 19 97 %  02/24/17 2045 (!) 121/55 - - 72 20 92 %  02/24/17 2030 (!) 129/54 - - 66 17 98 %  02/24/17 2000 130/63 - - 65 (!) 21 95 %  02/24/17 1945 (!) 126/54 - - 74 16 96 %  02/24/17 1930 123/67 - - 81 18 99 %  02/24/17 1929 130/70 97.7 F (36.5 C) Oral 80 18 98 %  02/24/17 1649 (!) 101/50 98.3 F (36.8 C) Oral 83 18 97 %  02/24/17 1359 (!) 122/50 97.6 F (36.4 C) Oral 66 18 100 %    11:36 PM Reevaluation with update and discussion. After initial assessment and treatment, an updated  evaluation reveals she is comfortable now, drinking fluids without difficulty and has no further complaints.  Findings discussed with patient and family members, all questions answered. Adahlia Stembridge L    Final Clinical Impressions(s) / ED Diagnoses   Final diagnoses:  Urinary tract infection without hematuria, site unspecified   UTI, patient nontoxic, doubt metabolic instability or serious bacterial infection.  Nursing Notes Reviewed/ Care Coordinated Applicable Imaging Reviewed Interpretation of Laboratory Data incorporated into ED treatment  The patient appears reasonably screened and/or stabilized for discharge and I doubt any other medical condition or other [facility] requiring further screening, evaluation, or treatment in the ED at this time prior to discharge.  Plan: Home Medications-continue usual medications.; Home Treatments-drink plenty of water each day; return here if the recommended treatment, does not improve the symptoms; Recommended follow up-PCP checkup 1 week and as needed    New Prescriptions New Prescriptions   CEPHALEXIN (KEFLEX) 500 MG CAPSULE    Take 1 capsule (500 mg total) by mouth 4 (four) times daily.     Daleen Bo, MD 02/24/17 2337

## 2017-02-24 NOTE — Discharge Instructions (Signed)
Drink 6 glasses of water each day  Use Tylenol, for pain or fever  Return here if needed for problems

## 2017-02-28 ENCOUNTER — Ambulatory Visit: Payer: Medicare Other | Admitting: Nurse Practitioner

## 2017-03-02 DIAGNOSIS — R531 Weakness: Secondary | ICD-10-CM | POA: Diagnosis not present

## 2017-04-06 ENCOUNTER — Emergency Department (HOSPITAL_COMMUNITY)
Admission: EM | Admit: 2017-04-06 | Discharge: 2017-04-07 | Disposition: A | Discharge status: Home / Self Care | Payer: Medicare Other | Attending: Emergency Medicine | Admitting: Emergency Medicine

## 2017-04-06 ENCOUNTER — Encounter (HOSPITAL_COMMUNITY): Payer: Self-pay

## 2017-04-06 DIAGNOSIS — Z791 Long term (current) use of non-steroidal anti-inflammatories (NSAID): Secondary | ICD-10-CM | POA: Insufficient documentation

## 2017-04-06 DIAGNOSIS — Z7982 Long term (current) use of aspirin: Secondary | ICD-10-CM | POA: Diagnosis not present

## 2017-04-06 DIAGNOSIS — T7840XA Allergy, unspecified, initial encounter: Secondary | ICD-10-CM | POA: Insufficient documentation

## 2017-04-06 DIAGNOSIS — Z859 Personal history of malignant neoplasm, unspecified: Secondary | ICD-10-CM | POA: Insufficient documentation

## 2017-04-06 DIAGNOSIS — N390 Urinary tract infection, site not specified: Secondary | ICD-10-CM | POA: Insufficient documentation

## 2017-04-06 DIAGNOSIS — Y929 Unspecified place or not applicable: Secondary | ICD-10-CM | POA: Diagnosis not present

## 2017-04-06 DIAGNOSIS — Z96653 Presence of artificial knee joint, bilateral: Secondary | ICD-10-CM | POA: Diagnosis not present

## 2017-04-06 DIAGNOSIS — R410 Disorientation, unspecified: Secondary | ICD-10-CM | POA: Diagnosis not present

## 2017-04-06 DIAGNOSIS — R451 Restlessness and agitation: Secondary | ICD-10-CM | POA: Diagnosis not present

## 2017-04-06 DIAGNOSIS — Y939 Activity, unspecified: Secondary | ICD-10-CM | POA: Diagnosis not present

## 2017-04-06 DIAGNOSIS — R531 Weakness: Secondary | ICD-10-CM | POA: Diagnosis not present

## 2017-04-06 DIAGNOSIS — F22 Delusional disorders: Secondary | ICD-10-CM | POA: Diagnosis not present

## 2017-04-06 DIAGNOSIS — R21 Rash and other nonspecific skin eruption: Secondary | ICD-10-CM

## 2017-04-06 DIAGNOSIS — Y999 Unspecified external cause status: Secondary | ICD-10-CM | POA: Insufficient documentation

## 2017-04-06 DIAGNOSIS — Z87891 Personal history of nicotine dependence: Secondary | ICD-10-CM | POA: Diagnosis not present

## 2017-04-06 DIAGNOSIS — Z96643 Presence of artificial hip joint, bilateral: Secondary | ICD-10-CM | POA: Insufficient documentation

## 2017-04-06 DIAGNOSIS — Z79899 Other long term (current) drug therapy: Secondary | ICD-10-CM | POA: Insufficient documentation

## 2017-04-06 LAB — URINALYSIS, ROUTINE W REFLEX MICROSCOPIC
BILIRUBIN URINE: NEGATIVE
GLUCOSE, UA: NEGATIVE mg/dL
HGB URINE DIPSTICK: NEGATIVE
Ketones, ur: NEGATIVE mg/dL
NITRITE: NEGATIVE
PH: 5 (ref 5.0–8.0)
Protein, ur: NEGATIVE mg/dL
RBC / HPF: NONE SEEN RBC/hpf (ref 0–5)
SPECIFIC GRAVITY, URINE: 1.013 (ref 1.005–1.030)

## 2017-04-06 LAB — COMPREHENSIVE METABOLIC PANEL
ALK PHOS: 67 U/L (ref 38–126)
ALT: 13 U/L — AB (ref 14–54)
ANION GAP: 8 (ref 5–15)
AST: 17 U/L (ref 15–41)
Albumin: 3.2 g/dL — ABNORMAL LOW (ref 3.5–5.0)
BUN: 25 mg/dL — ABNORMAL HIGH (ref 6–20)
CALCIUM: 9 mg/dL (ref 8.9–10.3)
CHLORIDE: 100 mmol/L — AB (ref 101–111)
CO2: 28 mmol/L (ref 22–32)
CREATININE: 0.86 mg/dL (ref 0.44–1.00)
GFR, EST NON AFRICAN AMERICAN: 57 mL/min — AB (ref >60)
Glucose, Bld: 92 mg/dL (ref 65–99)
Potassium: 4.4 mmol/L (ref 3.5–5.1)
Sodium: 136 mmol/L (ref 135–145)
Total Bilirubin: 1.1 mg/dL (ref 0.3–1.2)
Total Protein: 6.1 g/dL — ABNORMAL LOW (ref 6.5–8.1)

## 2017-04-06 LAB — CBC WITH DIFFERENTIAL/PLATELET
Basophils Absolute: 0 10*3/uL (ref 0.0–0.1)
Basophils Relative: 0 %
EOS PCT: 4 %
Eosinophils Absolute: 0.4 10*3/uL (ref 0.0–0.7)
HCT: 43.5 % (ref 36.0–46.0)
Hemoglobin: 14 g/dL (ref 12.0–15.0)
LYMPHS ABS: 1.7 10*3/uL (ref 0.7–4.0)
LYMPHS PCT: 19 %
MCH: 29.4 pg (ref 26.0–34.0)
MCHC: 32.2 g/dL (ref 30.0–36.0)
MCV: 91.2 fL (ref 78.0–100.0)
MONO ABS: 0.9 10*3/uL (ref 0.1–1.0)
MONOS PCT: 10 %
Neutro Abs: 6.1 10*3/uL (ref 1.7–7.7)
Neutrophils Relative %: 67 %
PLATELETS: 213 10*3/uL (ref 150–400)
RBC: 4.77 MIL/uL (ref 3.87–5.11)
RDW: 13.6 % (ref 11.5–15.5)
WBC: 9.1 10*3/uL (ref 4.0–10.5)

## 2017-04-06 MED ORDER — METHYLPREDNISOLONE SODIUM SUCC 125 MG IJ SOLR
80.0000 mg | Freq: Once | INTRAMUSCULAR | Status: AC
  Administered 2017-04-06: 80 mg via INTRAVENOUS
  Filled 2017-04-06: qty 2

## 2017-04-06 NOTE — ED Provider Notes (Signed)
[facility] DEPT Provider Note   CSN: [id number] Arrival date & time: 04/06/17  2152     History   Chief Complaint Chief Complaint  Patient presents with  . Rash  . Altered Mental Status    HPI Alexis York is a 83 y.o. female.  Patient presents to the emergency department for evaluation of multiple problems. Patient has been experiencing an itchy rash for 1 week. Family reports that the rash has progressively been spreading and becoming worse. They had been applying Vagistat and hydrocortisone to the areas. Initially this helped, now the rash has spread over most of her body. Patient reports that it is extremely itchy and does have some burning pain with it. A nurse practitioner came to the house today, thought it was an allergic reaction and ordered and is in which has not been started yet. Family has been giving her Benadryl 50 mg per dose for the itching. Today she became somewhat agitated and confused.      Past Medical History:  Diagnosis Date  . Arthritis   . Cancer [facility]) 1973   left mastectomy  . GERD (gastroesophageal reflux disease)   . Hypercholesteremia   . Memory impairment    short term memory loss  . Osteoporosis   . Primary osteoarthritis of left knee 10/27/2014  . Vitamin D deficiency     Patient Active Problem List   Diagnosis Date Noted  . Cholecystitis 03/29/2016  . Hyponatremia 03/29/2016  . Elevated troponin 03/29/2016  . Acute cholecystitis 03/29/2016  . Primary osteoarthritis of left knee 10/27/2014  . Knee osteoarthritis 10/27/2014  . Multifactorial gait disorder 10/23/2013  . Dementia arising in the senium and presenium 10/23/2013  . Senile dementia, uncomplicated 05/02/9484  . Abnormality of gait 04/30/2013    Past Surgical History:  Procedure Laterality Date  . ABDOMINAL HYSTERECTOMY  1975  . APPENDECTOMY     teenager  . BREAST SURGERY  1975   mastectomy  . CHOLECYSTECTOMY N/A 03/31/2016   Procedure: LAPAROSCOPIC  CHOLECYSTECTOMY;  Surgeon: Coralie Keens, MD;  Location: [place];  Service: General;  Laterality: N/A;  . JOINT REPLACEMENT  15 yr/10 yr ago   bilateral hip replacements  . MASTECTOMY Left   . TONSILLECTOMY    . TOTAL KNEE ARTHROPLASTY Left 10/27/2014   dr Mardelle Matte  . TOTAL KNEE ARTHROPLASTY Left 10/27/2014   Procedure: TOTAL KNEE ARTHROPLASTY;  Surgeon: Johnny Bridge, MD;  Location: [place];  Service: Orthopedics;  Laterality: Left;    OB History    No data available       Home Medications    Prior to Admission medications   Medication Sig Start Date End Date Taking? Authorizing Provider  aspirin 81 MG tablet Take 81 mg by mouth daily.    [provider]  calcium-vitamin D (OSCAL WITH D) 500-200 MG-UNIT per tablet Take 1 tablet by mouth 2 (two) times daily.     [provider]  celecoxib (CELEBREX) 200 MG capsule Take 200 mg by mouth 2 (two) times daily.    [provider]  cephALEXin (KEFLEX) 500 MG capsule Take 1 capsule (500 mg total) by mouth 2 (two) times daily. 04/07/17   Orpah Greek, MD  divalproex (DEPAKOTE SPRINKLE) 125 MG capsule Take 125 mg by mouth 2 (two) times daily.    [provider]  donepezil (ARICEPT) 10 MG tablet Take 1 tablet (10 mg total) by mouth daily. 02/29/16   Dohmeier, Asencion Partridge, MD  memantine (NAMENDA XR) 28 MG  CP24 24 hr capsule Take 1 capsule (28 mg total) by mouth daily. 08/30/16   Dennie Bible, NP  omeprazole (PRILOSEC) 20 MG capsule Take 20 mg by mouth daily.    [provider]  rosuvastatin (CRESTOR) 20 MG tablet Take 20 mg by mouth daily.    [provider]  traMADol (ULTRAM) 50 MG tablet Take 1 tablet (50 mg total) by mouth every 6 (six) hours as needed for moderate pain. 04/02/16   Orson Eva, MD  Vitamin D, Ergocalciferol, (DRISDOL) 50000 UNITS CAPS Take 50,000 Units by mouth every Sunday.     [provider]    Family History Family History  Problem Relation Age of  Onset  . Dementia Mother   . Cancer - Colon Mother   . Cancer - Ovarian Sister     Social History Social History  Substance Use Topics  . Smoking status: Former Smoker    Packs/day: 1.00    Years: 22.00    Types: Cigarettes    Quit date: 12/20/1971  . Smokeless tobacco: Never Used  . Alcohol use Yes     Comment: occasional     Allergies   Other; Codeine; Quinolones; and Benzalkonium chloride   Review of Systems Review of Systems  Skin: Positive for rash.  Neurological: Negative for weakness.  Psychiatric/Behavioral: Positive for confusion.  All other systems reviewed and are negative.    Physical Exam Updated Vital Signs BP 104/61   Pulse 78   Resp 19   Ht 5\' 7"  (1.702 m)   Wt 63 kg (139 lb)   SpO2 95%   BMI 21.77 kg/m   Physical Exam  Constitutional: She appears well-developed and well-nourished. No distress.  HENT:  Head: Normocephalic and atraumatic.  Right Ear: Hearing normal.  Left Ear: Hearing normal.  Nose: Nose normal.  Mouth/Throat: Oropharynx is clear and moist and mucous membranes are normal.  Eyes: Conjunctivae and EOM are normal. Pupils are equal, round, and reactive to light.  Neck: Normal range of motion. Neck supple.  Cardiovascular: Regular rhythm, S1 normal and S2 normal.  Exam reveals no gallop and no friction rub.   No murmur heard. Pulmonary/Chest: Effort normal and breath sounds normal. No respiratory distress. She exhibits no tenderness.  Abdominal: Soft. Normal appearance and bowel sounds are normal. There is no hepatosplenomegaly. There is no tenderness. There is no rebound, no guarding, no tenderness at McBurney's point and negative Murphy's sign. No hernia.  Musculoskeletal: Normal range of motion.  Neurological: She is alert. She has normal strength. No cranial nerve deficit or sensory deficit. Coordination normal. GCS eye subscore is 4. GCS verbal subscore is 5. GCS motor subscore is 6.  Skin: Skin is warm, dry and intact. No  rash (Diffuse patchy erythema) noted. No cyanosis.  Psychiatric: She has a normal mood and affect. Her speech is normal and behavior is normal. Thought content normal.  Nursing note and vitals reviewed.    ED Treatments / Results  Labs (all labs ordered are listed, but only abnormal results are displayed) Labs Reviewed  URINALYSIS, ROUTINE W REFLEX MICROSCOPIC - Abnormal; Notable for the following:       Result Value   APPearance HAZY (*)    Leukocytes, UA TRACE (*)    Bacteria, UA MANY (*)    Squamous Epithelial / LPF 0-5 (*)    All other components within normal limits  COMPREHENSIVE METABOLIC PANEL - Abnormal; Notable for the following:    Chloride 100 (*)  BUN 25 (*)    Total Protein 6.1 (*)    Albumin 3.2 (*)    ALT 13 (*)    GFR calc non Af Amer 57 (*)    All other components within normal limits  URINE CULTURE  CBC WITH DIFFERENTIAL/PLATELET    EKG  EKG Interpretation  Date/Time:  Thursday Apr 06 2017 23:18:13 EDT Ventricular Rate:  77 PR Interval:    QRS Duration: 77 QT Interval:  411 QTC Calculation: 460 R Axis:   68 Text Interpretation:  Sinus rhythm Atrial premature complex Borderline T abnormalities, anterior leads Baseline wander in lead(s) V2 V4 V6 Confirmed by Brantley Stage [phone]) on 04/07/2017 12:24:19 AM       Radiology No results found.  Procedures Procedures (including critical care time)  Medications Ordered in ED Medications  methylPREDNISolone sodium succinate (SOLU-MEDROL) 125 mg/2 mL injection 80 mg (80 mg Intravenous Given 04/06/17 2323)  cefTRIAXone (ROCEPHIN) 1 g in dextrose 5 % 50 mL IVPB (1 g Intravenous New Bag/Given 04/07/17 0041)     Initial Impression / Assessment and Plan / ED Course  I have reviewed the triage vital signs and the nursing notes.  Pertinent labs & imaging results that were available during my care of the patient were reviewed by me and considered in my medical decision making (see chart for details).      Patient presents to the emergency part for evaluation of confusion. Patient reportedly has been experiencing itchy rash for one week. Family has been giving her Benadryl. She has been slightly agitated, picking at things, confused. This is likely multifactorial. She does not have any focal neurologic deficits on examination. I suspect that some of her confusion is secondary to the Benadryl use. Additionally, however, urinalysis suggests infection. Patient administered Rocephin here in the ER. Urine will be cultured. Remainder of blood work was unremarkable. Rash is nonspecific, does not appear infectious. Her doctor prescribed prednisone that is to be started in the morning. She was given Solu-Medrol here in the ER. She will stop taking Benadryl, will use Claritin as needed for itching. Will continue Keflex. Follow-up with primary doctor.  Final Clinical Impressions(s) / ED Diagnoses   Final diagnoses:  Confusion  Rash  Allergic reaction, initial encounter  Lower urinary tract infectious disease    New Prescriptions New Prescriptions   CEPHALEXIN (KEFLEX) 500 MG CAPSULE    Take 1 capsule (500 mg total) by mouth 2 (two) times daily.     Orpah Greek, MD 04/07/17 0111

## 2017-04-06 NOTE — ED Triage Notes (Signed)
Pt arrived via [facility] from home c/o ALOC and rash.  Took Benadryl at 5pm, prescribed prednisone but hasn't taken it.

## 2017-04-07 DIAGNOSIS — T7840XA Allergy, unspecified, initial encounter: Secondary | ICD-10-CM | POA: Diagnosis not present

## 2017-04-07 MED ORDER — DEXTROSE 5 % IV SOLN
1.0000 g | Freq: Once | INTRAVENOUS | Status: AC
  Administered 2017-04-07: 1 g via INTRAVENOUS
  Filled 2017-04-07: qty 10

## 2017-04-07 MED ORDER — CEPHALEXIN 500 MG PO CAPS: 500.0000 mg | ORAL_CAPSULE | Freq: Two times a day (BID) | ORAL | 0 refills | Status: AC

## 2017-04-07 NOTE — Discharge Instructions (Signed)
Stop Benadryl. May use claritin 1/2 tablet daily for itching. Take the prednisone as prescribed by your doctor.

## 2017-04-07 NOTE — ED Notes (Signed)
Pt stable, ambulatory, states understanding of discharge instructions, family at bedside. 

## 2017-04-09 LAB — URINE CULTURE: Culture: >=100000 — AB

## 2017-04-10 ENCOUNTER — Telehealth: Payer: Self-pay | Admitting: Emergency Medicine

## 2017-04-10 NOTE — Telephone Encounter (Signed)
Post ED Visit - Positive Culture Follow-up  Culture report reviewed by antimicrobial stewardship pharmacist:  [x]  Elenor Quinones, Pharm.D. []  Heide Guile, Pharm.D., BCPS AQ-ID []  Parks Neptune, Pharm.D., BCPS []  Alycia Rossetti, Pharm.D., BCPS []  [place], [place].D., BCPS, AAHIVP []  Legrand Como, Pharm.D., BCPS, AAHIVP []  Salome Arnt, PharmD, BCPS []  Dimitri Ped, PharmD, BCPS []  Vincenza Hews, PharmD, BCPS  Positive urine culture Treated with cephalexin, organism sensitive to the same and no further patient follow-up is required at this time.  Hazle Nordmann 04/10/2017, 10:24 AM

## 2017-04-13 DIAGNOSIS — G309 Alzheimer's disease, unspecified: Secondary | ICD-10-CM | POA: Diagnosis not present

## 2017-04-13 DIAGNOSIS — M545 Low back pain: Secondary | ICD-10-CM | POA: Diagnosis not present

## 2017-04-13 DIAGNOSIS — R2689 Other abnormalities of gait and mobility: Secondary | ICD-10-CM | POA: Diagnosis not present

## 2017-04-13 DIAGNOSIS — M170 Bilateral primary osteoarthritis of knee: Secondary | ICD-10-CM | POA: Diagnosis not present

## 2017-04-13 DIAGNOSIS — L298 Other pruritus: Secondary | ICD-10-CM | POA: Diagnosis not present

## 2017-04-13 DIAGNOSIS — Z6823 Body mass index (BMI) 23.0-23.9, adult: Secondary | ICD-10-CM | POA: Diagnosis not present

## 2017-04-14 DIAGNOSIS — R531 Weakness: Secondary | ICD-10-CM | POA: Diagnosis not present

## 2017-05-02 DIAGNOSIS — M6281 Muscle weakness (generalized): Secondary | ICD-10-CM | POA: Diagnosis not present

## 2017-05-02 DIAGNOSIS — Z853 Personal history of malignant neoplasm of breast: Secondary | ICD-10-CM | POA: Diagnosis not present

## 2017-05-02 DIAGNOSIS — G309 Alzheimer's disease, unspecified: Secondary | ICD-10-CM | POA: Diagnosis not present

## 2017-05-02 DIAGNOSIS — M170 Bilateral primary osteoarthritis of knee: Secondary | ICD-10-CM | POA: Diagnosis not present

## 2017-05-02 DIAGNOSIS — G308 Other Alzheimer's disease: Secondary | ICD-10-CM | POA: Diagnosis not present

## 2017-05-02 DIAGNOSIS — Z87891 Personal history of nicotine dependence: Secondary | ICD-10-CM | POA: Diagnosis not present

## 2017-05-02 DIAGNOSIS — E785 Hyperlipidemia, unspecified: Secondary | ICD-10-CM | POA: Diagnosis not present

## 2017-05-02 DIAGNOSIS — F0280 Dementia in other diseases classified elsewhere without behavioral disturbance: Secondary | ICD-10-CM | POA: Diagnosis not present

## 2017-05-02 DIAGNOSIS — Z9181 History of falling: Secondary | ICD-10-CM | POA: Diagnosis not present

## 2017-05-02 DIAGNOSIS — M810 Age-related osteoporosis without current pathological fracture: Secondary | ICD-10-CM | POA: Diagnosis not present

## 2017-05-02 DIAGNOSIS — E784 Other hyperlipidemia: Secondary | ICD-10-CM | POA: Diagnosis not present

## 2017-05-05 DIAGNOSIS — M810 Age-related osteoporosis without current pathological fracture: Secondary | ICD-10-CM | POA: Diagnosis not present

## 2017-05-05 DIAGNOSIS — E785 Hyperlipidemia, unspecified: Secondary | ICD-10-CM | POA: Diagnosis not present

## 2017-05-05 DIAGNOSIS — G309 Alzheimer's disease, unspecified: Secondary | ICD-10-CM | POA: Diagnosis not present

## 2017-05-05 DIAGNOSIS — M170 Bilateral primary osteoarthritis of knee: Secondary | ICD-10-CM | POA: Diagnosis not present

## 2017-05-05 DIAGNOSIS — F0280 Dementia in other diseases classified elsewhere without behavioral disturbance: Secondary | ICD-10-CM | POA: Diagnosis not present

## 2017-05-05 DIAGNOSIS — M6281 Muscle weakness (generalized): Secondary | ICD-10-CM | POA: Diagnosis not present

## 2017-05-08 DIAGNOSIS — E785 Hyperlipidemia, unspecified: Secondary | ICD-10-CM | POA: Diagnosis not present

## 2017-05-08 DIAGNOSIS — G309 Alzheimer's disease, unspecified: Secondary | ICD-10-CM | POA: Diagnosis not present

## 2017-05-08 DIAGNOSIS — M170 Bilateral primary osteoarthritis of knee: Secondary | ICD-10-CM | POA: Diagnosis not present

## 2017-05-08 DIAGNOSIS — M810 Age-related osteoporosis without current pathological fracture: Secondary | ICD-10-CM | POA: Diagnosis not present

## 2017-05-08 DIAGNOSIS — M6281 Muscle weakness (generalized): Secondary | ICD-10-CM | POA: Diagnosis not present

## 2017-05-08 DIAGNOSIS — F0280 Dementia in other diseases classified elsewhere without behavioral disturbance: Secondary | ICD-10-CM | POA: Diagnosis not present

## 2017-05-12 DIAGNOSIS — M6281 Muscle weakness (generalized): Secondary | ICD-10-CM | POA: Diagnosis not present

## 2017-05-12 DIAGNOSIS — G309 Alzheimer's disease, unspecified: Secondary | ICD-10-CM | POA: Diagnosis not present

## 2017-05-12 DIAGNOSIS — M810 Age-related osteoporosis without current pathological fracture: Secondary | ICD-10-CM | POA: Diagnosis not present

## 2017-05-12 DIAGNOSIS — E785 Hyperlipidemia, unspecified: Secondary | ICD-10-CM | POA: Diagnosis not present

## 2017-05-12 DIAGNOSIS — F0280 Dementia in other diseases classified elsewhere without behavioral disturbance: Secondary | ICD-10-CM | POA: Diagnosis not present

## 2017-05-12 DIAGNOSIS — M170 Bilateral primary osteoarthritis of knee: Secondary | ICD-10-CM | POA: Diagnosis not present

## 2017-05-15 DIAGNOSIS — F0280 Dementia in other diseases classified elsewhere without behavioral disturbance: Secondary | ICD-10-CM | POA: Diagnosis not present

## 2017-05-15 DIAGNOSIS — E785 Hyperlipidemia, unspecified: Secondary | ICD-10-CM | POA: Diagnosis not present

## 2017-05-15 DIAGNOSIS — R531 Weakness: Secondary | ICD-10-CM | POA: Diagnosis not present

## 2017-05-15 DIAGNOSIS — M6281 Muscle weakness (generalized): Secondary | ICD-10-CM | POA: Diagnosis not present

## 2017-05-15 DIAGNOSIS — G309 Alzheimer's disease, unspecified: Secondary | ICD-10-CM | POA: Diagnosis not present

## 2017-05-15 DIAGNOSIS — M170 Bilateral primary osteoarthritis of knee: Secondary | ICD-10-CM | POA: Diagnosis not present

## 2017-05-15 DIAGNOSIS — M810 Age-related osteoporosis without current pathological fracture: Secondary | ICD-10-CM | POA: Diagnosis not present

## 2017-05-17 DIAGNOSIS — E785 Hyperlipidemia, unspecified: Secondary | ICD-10-CM | POA: Diagnosis not present

## 2017-05-17 DIAGNOSIS — G309 Alzheimer's disease, unspecified: Secondary | ICD-10-CM | POA: Diagnosis not present

## 2017-05-17 DIAGNOSIS — M810 Age-related osteoporosis without current pathological fracture: Secondary | ICD-10-CM | POA: Diagnosis not present

## 2017-05-17 DIAGNOSIS — M170 Bilateral primary osteoarthritis of knee: Secondary | ICD-10-CM | POA: Diagnosis not present

## 2017-05-17 DIAGNOSIS — M6281 Muscle weakness (generalized): Secondary | ICD-10-CM | POA: Diagnosis not present

## 2017-05-17 DIAGNOSIS — F0280 Dementia in other diseases classified elsewhere without behavioral disturbance: Secondary | ICD-10-CM | POA: Diagnosis not present

## 2017-05-22 DIAGNOSIS — M6281 Muscle weakness (generalized): Secondary | ICD-10-CM | POA: Diagnosis not present

## 2017-05-22 DIAGNOSIS — M170 Bilateral primary osteoarthritis of knee: Secondary | ICD-10-CM | POA: Diagnosis not present

## 2017-05-22 DIAGNOSIS — F0280 Dementia in other diseases classified elsewhere without behavioral disturbance: Secondary | ICD-10-CM | POA: Diagnosis not present

## 2017-05-22 DIAGNOSIS — E785 Hyperlipidemia, unspecified: Secondary | ICD-10-CM | POA: Diagnosis not present

## 2017-05-22 DIAGNOSIS — G309 Alzheimer's disease, unspecified: Secondary | ICD-10-CM | POA: Diagnosis not present

## 2017-05-22 DIAGNOSIS — M810 Age-related osteoporosis without current pathological fracture: Secondary | ICD-10-CM | POA: Diagnosis not present

## 2017-05-23 DIAGNOSIS — G309 Alzheimer's disease, unspecified: Secondary | ICD-10-CM | POA: Diagnosis not present

## 2017-05-23 DIAGNOSIS — M170 Bilateral primary osteoarthritis of knee: Secondary | ICD-10-CM | POA: Diagnosis not present

## 2017-05-23 DIAGNOSIS — E785 Hyperlipidemia, unspecified: Secondary | ICD-10-CM | POA: Diagnosis not present

## 2017-05-23 DIAGNOSIS — M810 Age-related osteoporosis without current pathological fracture: Secondary | ICD-10-CM | POA: Diagnosis not present

## 2017-05-23 DIAGNOSIS — M6281 Muscle weakness (generalized): Secondary | ICD-10-CM | POA: Diagnosis not present

## 2017-05-23 DIAGNOSIS — F0280 Dementia in other diseases classified elsewhere without behavioral disturbance: Secondary | ICD-10-CM | POA: Diagnosis not present

## 2017-05-24 DIAGNOSIS — G309 Alzheimer's disease, unspecified: Secondary | ICD-10-CM | POA: Diagnosis not present

## 2017-05-24 DIAGNOSIS — M170 Bilateral primary osteoarthritis of knee: Secondary | ICD-10-CM | POA: Diagnosis not present

## 2017-05-24 DIAGNOSIS — F0280 Dementia in other diseases classified elsewhere without behavioral disturbance: Secondary | ICD-10-CM | POA: Diagnosis not present

## 2017-05-24 DIAGNOSIS — M810 Age-related osteoporosis without current pathological fracture: Secondary | ICD-10-CM | POA: Diagnosis not present

## 2017-05-24 DIAGNOSIS — E785 Hyperlipidemia, unspecified: Secondary | ICD-10-CM | POA: Diagnosis not present

## 2017-05-24 DIAGNOSIS — M6281 Muscle weakness (generalized): Secondary | ICD-10-CM | POA: Diagnosis not present

## 2017-05-29 DIAGNOSIS — E785 Hyperlipidemia, unspecified: Secondary | ICD-10-CM | POA: Diagnosis not present

## 2017-05-29 DIAGNOSIS — M170 Bilateral primary osteoarthritis of knee: Secondary | ICD-10-CM | POA: Diagnosis not present

## 2017-05-29 DIAGNOSIS — M6281 Muscle weakness (generalized): Secondary | ICD-10-CM | POA: Diagnosis not present

## 2017-05-29 DIAGNOSIS — L89610 Pressure ulcer of right heel, unstageable: Secondary | ICD-10-CM | POA: Diagnosis not present

## 2017-05-29 DIAGNOSIS — E782 Mixed hyperlipidemia: Secondary | ICD-10-CM | POA: Diagnosis not present

## 2017-05-29 DIAGNOSIS — F0280 Dementia in other diseases classified elsewhere without behavioral disturbance: Secondary | ICD-10-CM | POA: Diagnosis not present

## 2017-05-29 DIAGNOSIS — M810 Age-related osteoporosis without current pathological fracture: Secondary | ICD-10-CM | POA: Diagnosis not present

## 2017-05-29 DIAGNOSIS — R600 Localized edema: Secondary | ICD-10-CM | POA: Diagnosis not present

## 2017-05-29 DIAGNOSIS — R531 Weakness: Secondary | ICD-10-CM | POA: Diagnosis not present

## 2017-05-29 DIAGNOSIS — G309 Alzheimer's disease, unspecified: Secondary | ICD-10-CM | POA: Diagnosis not present

## 2017-05-29 DIAGNOSIS — G301 Alzheimer's disease with late onset: Secondary | ICD-10-CM | POA: Diagnosis not present

## 2017-05-30 DIAGNOSIS — E785 Hyperlipidemia, unspecified: Secondary | ICD-10-CM | POA: Diagnosis not present

## 2017-05-30 DIAGNOSIS — M6281 Muscle weakness (generalized): Secondary | ICD-10-CM | POA: Diagnosis not present

## 2017-05-30 DIAGNOSIS — F0280 Dementia in other diseases classified elsewhere without behavioral disturbance: Secondary | ICD-10-CM | POA: Diagnosis not present

## 2017-05-30 DIAGNOSIS — M810 Age-related osteoporosis without current pathological fracture: Secondary | ICD-10-CM | POA: Diagnosis not present

## 2017-05-30 DIAGNOSIS — G309 Alzheimer's disease, unspecified: Secondary | ICD-10-CM | POA: Diagnosis not present

## 2017-05-30 DIAGNOSIS — M170 Bilateral primary osteoarthritis of knee: Secondary | ICD-10-CM | POA: Diagnosis not present

## 2017-05-31 DIAGNOSIS — M810 Age-related osteoporosis without current pathological fracture: Secondary | ICD-10-CM | POA: Diagnosis not present

## 2017-05-31 DIAGNOSIS — G309 Alzheimer's disease, unspecified: Secondary | ICD-10-CM | POA: Diagnosis not present

## 2017-05-31 DIAGNOSIS — M170 Bilateral primary osteoarthritis of knee: Secondary | ICD-10-CM | POA: Diagnosis not present

## 2017-05-31 DIAGNOSIS — E785 Hyperlipidemia, unspecified: Secondary | ICD-10-CM | POA: Diagnosis not present

## 2017-05-31 DIAGNOSIS — M6281 Muscle weakness (generalized): Secondary | ICD-10-CM | POA: Diagnosis not present

## 2017-05-31 DIAGNOSIS — F0280 Dementia in other diseases classified elsewhere without behavioral disturbance: Secondary | ICD-10-CM | POA: Diagnosis not present

## 2017-06-02 DIAGNOSIS — R531 Weakness: Secondary | ICD-10-CM | POA: Diagnosis not present

## 2017-06-03 DIAGNOSIS — G309 Alzheimer's disease, unspecified: Secondary | ICD-10-CM | POA: Diagnosis not present

## 2017-06-03 DIAGNOSIS — K219 Gastro-esophageal reflux disease without esophagitis: Secondary | ICD-10-CM | POA: Diagnosis not present

## 2017-06-03 DIAGNOSIS — I739 Peripheral vascular disease, unspecified: Secondary | ICD-10-CM | POA: Diagnosis not present

## 2017-06-03 DIAGNOSIS — L8990 Pressure ulcer of unspecified site, unspecified stage: Secondary | ICD-10-CM | POA: Diagnosis not present

## 2017-06-03 DIAGNOSIS — J301 Allergic rhinitis due to pollen: Secondary | ICD-10-CM | POA: Diagnosis not present

## 2017-06-03 DIAGNOSIS — R1310 Dysphagia, unspecified: Secondary | ICD-10-CM | POA: Diagnosis not present

## 2017-06-04 DIAGNOSIS — K219 Gastro-esophageal reflux disease without esophagitis: Secondary | ICD-10-CM | POA: Diagnosis not present

## 2017-06-04 DIAGNOSIS — R1310 Dysphagia, unspecified: Secondary | ICD-10-CM | POA: Diagnosis not present

## 2017-06-04 DIAGNOSIS — J301 Allergic rhinitis due to pollen: Secondary | ICD-10-CM | POA: Diagnosis not present

## 2017-06-04 DIAGNOSIS — G309 Alzheimer's disease, unspecified: Secondary | ICD-10-CM | POA: Diagnosis not present

## 2017-06-04 DIAGNOSIS — L8990 Pressure ulcer of unspecified site, unspecified stage: Secondary | ICD-10-CM | POA: Diagnosis not present

## 2017-06-04 DIAGNOSIS — I739 Peripheral vascular disease, unspecified: Secondary | ICD-10-CM | POA: Diagnosis not present

## 2017-06-05 DIAGNOSIS — K219 Gastro-esophageal reflux disease without esophagitis: Secondary | ICD-10-CM | POA: Diagnosis not present

## 2017-06-05 DIAGNOSIS — G309 Alzheimer's disease, unspecified: Secondary | ICD-10-CM | POA: Diagnosis not present

## 2017-06-05 DIAGNOSIS — R1310 Dysphagia, unspecified: Secondary | ICD-10-CM | POA: Diagnosis not present

## 2017-06-05 DIAGNOSIS — J301 Allergic rhinitis due to pollen: Secondary | ICD-10-CM | POA: Diagnosis not present

## 2017-06-05 DIAGNOSIS — I739 Peripheral vascular disease, unspecified: Secondary | ICD-10-CM | POA: Diagnosis not present

## 2017-06-05 DIAGNOSIS — L8990 Pressure ulcer of unspecified site, unspecified stage: Secondary | ICD-10-CM | POA: Diagnosis not present

## 2017-06-07 DIAGNOSIS — K219 Gastro-esophageal reflux disease without esophagitis: Secondary | ICD-10-CM | POA: Diagnosis not present

## 2017-06-07 DIAGNOSIS — I739 Peripheral vascular disease, unspecified: Secondary | ICD-10-CM | POA: Diagnosis not present

## 2017-06-07 DIAGNOSIS — L8990 Pressure ulcer of unspecified site, unspecified stage: Secondary | ICD-10-CM | POA: Diagnosis not present

## 2017-06-07 DIAGNOSIS — J301 Allergic rhinitis due to pollen: Secondary | ICD-10-CM | POA: Diagnosis not present

## 2017-06-07 DIAGNOSIS — G309 Alzheimer's disease, unspecified: Secondary | ICD-10-CM | POA: Diagnosis not present

## 2017-06-07 DIAGNOSIS — R1310 Dysphagia, unspecified: Secondary | ICD-10-CM | POA: Diagnosis not present

## 2017-06-08 DIAGNOSIS — I739 Peripheral vascular disease, unspecified: Secondary | ICD-10-CM | POA: Diagnosis not present

## 2017-06-08 DIAGNOSIS — R1310 Dysphagia, unspecified: Secondary | ICD-10-CM | POA: Diagnosis not present

## 2017-06-08 DIAGNOSIS — K219 Gastro-esophageal reflux disease without esophagitis: Secondary | ICD-10-CM | POA: Diagnosis not present

## 2017-06-08 DIAGNOSIS — L8990 Pressure ulcer of unspecified site, unspecified stage: Secondary | ICD-10-CM | POA: Diagnosis not present

## 2017-06-08 DIAGNOSIS — J301 Allergic rhinitis due to pollen: Secondary | ICD-10-CM | POA: Diagnosis not present

## 2017-06-08 DIAGNOSIS — G309 Alzheimer's disease, unspecified: Secondary | ICD-10-CM | POA: Diagnosis not present

## 2017-06-09 DIAGNOSIS — I739 Peripheral vascular disease, unspecified: Secondary | ICD-10-CM | POA: Diagnosis not present

## 2017-06-09 DIAGNOSIS — R1310 Dysphagia, unspecified: Secondary | ICD-10-CM | POA: Diagnosis not present

## 2017-06-09 DIAGNOSIS — L8990 Pressure ulcer of unspecified site, unspecified stage: Secondary | ICD-10-CM | POA: Diagnosis not present

## 2017-06-09 DIAGNOSIS — G309 Alzheimer's disease, unspecified: Secondary | ICD-10-CM | POA: Diagnosis not present

## 2017-06-09 DIAGNOSIS — K219 Gastro-esophageal reflux disease without esophagitis: Secondary | ICD-10-CM | POA: Diagnosis not present

## 2017-06-09 DIAGNOSIS — J301 Allergic rhinitis due to pollen: Secondary | ICD-10-CM | POA: Diagnosis not present

## 2017-06-12 DIAGNOSIS — L8990 Pressure ulcer of unspecified site, unspecified stage: Secondary | ICD-10-CM | POA: Diagnosis not present

## 2017-06-12 DIAGNOSIS — R1310 Dysphagia, unspecified: Secondary | ICD-10-CM | POA: Diagnosis not present

## 2017-06-12 DIAGNOSIS — I739 Peripheral vascular disease, unspecified: Secondary | ICD-10-CM | POA: Diagnosis not present

## 2017-06-12 DIAGNOSIS — J301 Allergic rhinitis due to pollen: Secondary | ICD-10-CM | POA: Diagnosis not present

## 2017-06-12 DIAGNOSIS — K219 Gastro-esophageal reflux disease without esophagitis: Secondary | ICD-10-CM | POA: Diagnosis not present

## 2017-06-12 DIAGNOSIS — G309 Alzheimer's disease, unspecified: Secondary | ICD-10-CM | POA: Diagnosis not present

## 2017-06-13 DIAGNOSIS — R1310 Dysphagia, unspecified: Secondary | ICD-10-CM | POA: Diagnosis not present

## 2017-06-13 DIAGNOSIS — G309 Alzheimer's disease, unspecified: Secondary | ICD-10-CM | POA: Diagnosis not present

## 2017-06-13 DIAGNOSIS — L8990 Pressure ulcer of unspecified site, unspecified stage: Secondary | ICD-10-CM | POA: Diagnosis not present

## 2017-06-13 DIAGNOSIS — J301 Allergic rhinitis due to pollen: Secondary | ICD-10-CM | POA: Diagnosis not present

## 2017-06-13 DIAGNOSIS — I739 Peripheral vascular disease, unspecified: Secondary | ICD-10-CM | POA: Diagnosis not present

## 2017-06-13 DIAGNOSIS — K219 Gastro-esophageal reflux disease without esophagitis: Secondary | ICD-10-CM | POA: Diagnosis not present

## 2017-06-14 DIAGNOSIS — I739 Peripheral vascular disease, unspecified: Secondary | ICD-10-CM | POA: Diagnosis not present

## 2017-06-14 DIAGNOSIS — L8990 Pressure ulcer of unspecified site, unspecified stage: Secondary | ICD-10-CM | POA: Diagnosis not present

## 2017-06-14 DIAGNOSIS — K219 Gastro-esophageal reflux disease without esophagitis: Secondary | ICD-10-CM | POA: Diagnosis not present

## 2017-06-14 DIAGNOSIS — R1310 Dysphagia, unspecified: Secondary | ICD-10-CM | POA: Diagnosis not present

## 2017-06-14 DIAGNOSIS — G309 Alzheimer's disease, unspecified: Secondary | ICD-10-CM | POA: Diagnosis not present

## 2017-06-14 DIAGNOSIS — J301 Allergic rhinitis due to pollen: Secondary | ICD-10-CM | POA: Diagnosis not present

## 2017-06-15 DIAGNOSIS — R1310 Dysphagia, unspecified: Secondary | ICD-10-CM | POA: Diagnosis not present

## 2017-06-15 DIAGNOSIS — L8990 Pressure ulcer of unspecified site, unspecified stage: Secondary | ICD-10-CM | POA: Diagnosis not present

## 2017-06-15 DIAGNOSIS — J301 Allergic rhinitis due to pollen: Secondary | ICD-10-CM | POA: Diagnosis not present

## 2017-06-15 DIAGNOSIS — G309 Alzheimer's disease, unspecified: Secondary | ICD-10-CM | POA: Diagnosis not present

## 2017-06-15 DIAGNOSIS — I739 Peripheral vascular disease, unspecified: Secondary | ICD-10-CM | POA: Diagnosis not present

## 2017-06-15 DIAGNOSIS — K219 Gastro-esophageal reflux disease without esophagitis: Secondary | ICD-10-CM | POA: Diagnosis not present

## 2017-06-16 DIAGNOSIS — L8990 Pressure ulcer of unspecified site, unspecified stage: Secondary | ICD-10-CM | POA: Diagnosis not present

## 2017-06-16 DIAGNOSIS — I739 Peripheral vascular disease, unspecified: Secondary | ICD-10-CM | POA: Diagnosis not present

## 2017-06-16 DIAGNOSIS — J301 Allergic rhinitis due to pollen: Secondary | ICD-10-CM | POA: Diagnosis not present

## 2017-06-16 DIAGNOSIS — G309 Alzheimer's disease, unspecified: Secondary | ICD-10-CM | POA: Diagnosis not present

## 2017-06-16 DIAGNOSIS — R1310 Dysphagia, unspecified: Secondary | ICD-10-CM | POA: Diagnosis not present

## 2017-06-16 DIAGNOSIS — K219 Gastro-esophageal reflux disease without esophagitis: Secondary | ICD-10-CM | POA: Diagnosis not present

## 2017-06-19 DIAGNOSIS — L8990 Pressure ulcer of unspecified site, unspecified stage: Secondary | ICD-10-CM | POA: Diagnosis not present

## 2017-06-19 DIAGNOSIS — K219 Gastro-esophageal reflux disease without esophagitis: Secondary | ICD-10-CM | POA: Diagnosis not present

## 2017-06-19 DIAGNOSIS — I739 Peripheral vascular disease, unspecified: Secondary | ICD-10-CM | POA: Diagnosis not present

## 2017-06-19 DIAGNOSIS — J301 Allergic rhinitis due to pollen: Secondary | ICD-10-CM | POA: Diagnosis not present

## 2017-06-19 DIAGNOSIS — R1310 Dysphagia, unspecified: Secondary | ICD-10-CM | POA: Diagnosis not present

## 2017-06-19 DIAGNOSIS — G309 Alzheimer's disease, unspecified: Secondary | ICD-10-CM | POA: Diagnosis not present

## 2017-06-20 DIAGNOSIS — I739 Peripheral vascular disease, unspecified: Secondary | ICD-10-CM | POA: Diagnosis not present

## 2017-06-20 DIAGNOSIS — K219 Gastro-esophageal reflux disease without esophagitis: Secondary | ICD-10-CM | POA: Diagnosis not present

## 2017-06-20 DIAGNOSIS — J301 Allergic rhinitis due to pollen: Secondary | ICD-10-CM | POA: Diagnosis not present

## 2017-06-20 DIAGNOSIS — L8990 Pressure ulcer of unspecified site, unspecified stage: Secondary | ICD-10-CM | POA: Diagnosis not present

## 2017-06-20 DIAGNOSIS — G309 Alzheimer's disease, unspecified: Secondary | ICD-10-CM | POA: Diagnosis not present

## 2017-06-20 DIAGNOSIS — R1310 Dysphagia, unspecified: Secondary | ICD-10-CM | POA: Diagnosis not present

## 2017-06-21 DIAGNOSIS — G309 Alzheimer's disease, unspecified: Secondary | ICD-10-CM | POA: Diagnosis not present

## 2017-06-21 DIAGNOSIS — K219 Gastro-esophageal reflux disease without esophagitis: Secondary | ICD-10-CM | POA: Diagnosis not present

## 2017-06-21 DIAGNOSIS — L8990 Pressure ulcer of unspecified site, unspecified stage: Secondary | ICD-10-CM | POA: Diagnosis not present

## 2017-06-21 DIAGNOSIS — I739 Peripheral vascular disease, unspecified: Secondary | ICD-10-CM | POA: Diagnosis not present

## 2017-06-21 DIAGNOSIS — R1310 Dysphagia, unspecified: Secondary | ICD-10-CM | POA: Diagnosis not present

## 2017-06-21 DIAGNOSIS — J301 Allergic rhinitis due to pollen: Secondary | ICD-10-CM | POA: Diagnosis not present

## 2017-06-23 DIAGNOSIS — G309 Alzheimer's disease, unspecified: Secondary | ICD-10-CM | POA: Diagnosis not present

## 2017-06-23 DIAGNOSIS — I739 Peripheral vascular disease, unspecified: Secondary | ICD-10-CM | POA: Diagnosis not present

## 2017-06-23 DIAGNOSIS — K219 Gastro-esophageal reflux disease without esophagitis: Secondary | ICD-10-CM | POA: Diagnosis not present

## 2017-06-23 DIAGNOSIS — J301 Allergic rhinitis due to pollen: Secondary | ICD-10-CM | POA: Diagnosis not present

## 2017-06-23 DIAGNOSIS — R1310 Dysphagia, unspecified: Secondary | ICD-10-CM | POA: Diagnosis not present

## 2017-06-23 DIAGNOSIS — L8990 Pressure ulcer of unspecified site, unspecified stage: Secondary | ICD-10-CM | POA: Diagnosis not present

## 2017-06-25 DIAGNOSIS — R1310 Dysphagia, unspecified: Secondary | ICD-10-CM | POA: Diagnosis not present

## 2017-06-25 DIAGNOSIS — K219 Gastro-esophageal reflux disease without esophagitis: Secondary | ICD-10-CM | POA: Diagnosis not present

## 2017-06-25 DIAGNOSIS — L8990 Pressure ulcer of unspecified site, unspecified stage: Secondary | ICD-10-CM | POA: Diagnosis not present

## 2017-06-25 DIAGNOSIS — J301 Allergic rhinitis due to pollen: Secondary | ICD-10-CM | POA: Diagnosis not present

## 2017-06-25 DIAGNOSIS — I739 Peripheral vascular disease, unspecified: Secondary | ICD-10-CM | POA: Diagnosis not present

## 2017-06-25 DIAGNOSIS — G309 Alzheimer's disease, unspecified: Secondary | ICD-10-CM | POA: Diagnosis not present

## 2017-06-26 DIAGNOSIS — L8990 Pressure ulcer of unspecified site, unspecified stage: Secondary | ICD-10-CM | POA: Diagnosis not present

## 2017-06-26 DIAGNOSIS — K219 Gastro-esophageal reflux disease without esophagitis: Secondary | ICD-10-CM | POA: Diagnosis not present

## 2017-06-26 DIAGNOSIS — J301 Allergic rhinitis due to pollen: Secondary | ICD-10-CM | POA: Diagnosis not present

## 2017-06-26 DIAGNOSIS — G309 Alzheimer's disease, unspecified: Secondary | ICD-10-CM | POA: Diagnosis not present

## 2017-06-26 DIAGNOSIS — R1310 Dysphagia, unspecified: Secondary | ICD-10-CM | POA: Diagnosis not present

## 2017-06-26 DIAGNOSIS — I739 Peripheral vascular disease, unspecified: Secondary | ICD-10-CM | POA: Diagnosis not present

## 2017-06-27 DIAGNOSIS — I739 Peripheral vascular disease, unspecified: Secondary | ICD-10-CM | POA: Diagnosis not present

## 2017-06-27 DIAGNOSIS — J301 Allergic rhinitis due to pollen: Secondary | ICD-10-CM | POA: Diagnosis not present

## 2017-06-27 DIAGNOSIS — K219 Gastro-esophageal reflux disease without esophagitis: Secondary | ICD-10-CM | POA: Diagnosis not present

## 2017-06-27 DIAGNOSIS — L8990 Pressure ulcer of unspecified site, unspecified stage: Secondary | ICD-10-CM | POA: Diagnosis not present

## 2017-06-27 DIAGNOSIS — G309 Alzheimer's disease, unspecified: Secondary | ICD-10-CM | POA: Diagnosis not present

## 2017-06-27 DIAGNOSIS — R1310 Dysphagia, unspecified: Secondary | ICD-10-CM | POA: Diagnosis not present

## 2017-06-28 DIAGNOSIS — I739 Peripheral vascular disease, unspecified: Secondary | ICD-10-CM | POA: Diagnosis not present

## 2017-06-28 DIAGNOSIS — L8990 Pressure ulcer of unspecified site, unspecified stage: Secondary | ICD-10-CM | POA: Diagnosis not present

## 2017-06-28 DIAGNOSIS — J301 Allergic rhinitis due to pollen: Secondary | ICD-10-CM | POA: Diagnosis not present

## 2017-06-28 DIAGNOSIS — G309 Alzheimer's disease, unspecified: Secondary | ICD-10-CM | POA: Diagnosis not present

## 2017-06-28 DIAGNOSIS — R1310 Dysphagia, unspecified: Secondary | ICD-10-CM | POA: Diagnosis not present

## 2017-06-28 DIAGNOSIS — K219 Gastro-esophageal reflux disease without esophagitis: Secondary | ICD-10-CM | POA: Diagnosis not present

## 2017-06-30 DIAGNOSIS — I739 Peripheral vascular disease, unspecified: Secondary | ICD-10-CM | POA: Diagnosis not present

## 2017-06-30 DIAGNOSIS — J301 Allergic rhinitis due to pollen: Secondary | ICD-10-CM | POA: Diagnosis not present

## 2017-06-30 DIAGNOSIS — R1310 Dysphagia, unspecified: Secondary | ICD-10-CM | POA: Diagnosis not present

## 2017-06-30 DIAGNOSIS — L8990 Pressure ulcer of unspecified site, unspecified stage: Secondary | ICD-10-CM | POA: Diagnosis not present

## 2017-06-30 DIAGNOSIS — G309 Alzheimer's disease, unspecified: Secondary | ICD-10-CM | POA: Diagnosis not present

## 2017-06-30 DIAGNOSIS — K219 Gastro-esophageal reflux disease without esophagitis: Secondary | ICD-10-CM | POA: Diagnosis not present

## 2017-07-03 DIAGNOSIS — G309 Alzheimer's disease, unspecified: Secondary | ICD-10-CM | POA: Diagnosis not present

## 2017-07-03 DIAGNOSIS — L8990 Pressure ulcer of unspecified site, unspecified stage: Secondary | ICD-10-CM | POA: Diagnosis not present

## 2017-07-03 DIAGNOSIS — I739 Peripheral vascular disease, unspecified: Secondary | ICD-10-CM | POA: Diagnosis not present

## 2017-07-03 DIAGNOSIS — J301 Allergic rhinitis due to pollen: Secondary | ICD-10-CM | POA: Diagnosis not present

## 2017-07-03 DIAGNOSIS — R1310 Dysphagia, unspecified: Secondary | ICD-10-CM | POA: Diagnosis not present

## 2017-07-03 DIAGNOSIS — K219 Gastro-esophageal reflux disease without esophagitis: Secondary | ICD-10-CM | POA: Diagnosis not present

## 2017-07-04 DIAGNOSIS — R1310 Dysphagia, unspecified: Secondary | ICD-10-CM | POA: Diagnosis not present

## 2017-07-04 DIAGNOSIS — G309 Alzheimer's disease, unspecified: Secondary | ICD-10-CM | POA: Diagnosis not present

## 2017-07-04 DIAGNOSIS — K219 Gastro-esophageal reflux disease without esophagitis: Secondary | ICD-10-CM | POA: Diagnosis not present

## 2017-07-04 DIAGNOSIS — L8990 Pressure ulcer of unspecified site, unspecified stage: Secondary | ICD-10-CM | POA: Diagnosis not present

## 2017-07-04 DIAGNOSIS — I739 Peripheral vascular disease, unspecified: Secondary | ICD-10-CM | POA: Diagnosis not present

## 2017-07-04 DIAGNOSIS — J301 Allergic rhinitis due to pollen: Secondary | ICD-10-CM | POA: Diagnosis not present

## 2017-07-05 DIAGNOSIS — R1310 Dysphagia, unspecified: Secondary | ICD-10-CM | POA: Diagnosis not present

## 2017-07-05 DIAGNOSIS — J301 Allergic rhinitis due to pollen: Secondary | ICD-10-CM | POA: Diagnosis not present

## 2017-07-05 DIAGNOSIS — L8990 Pressure ulcer of unspecified site, unspecified stage: Secondary | ICD-10-CM | POA: Diagnosis not present

## 2017-07-05 DIAGNOSIS — I739 Peripheral vascular disease, unspecified: Secondary | ICD-10-CM | POA: Diagnosis not present

## 2017-07-05 DIAGNOSIS — K219 Gastro-esophageal reflux disease without esophagitis: Secondary | ICD-10-CM | POA: Diagnosis not present

## 2017-07-05 DIAGNOSIS — G309 Alzheimer's disease, unspecified: Secondary | ICD-10-CM | POA: Diagnosis not present

## 2017-07-07 DIAGNOSIS — L8990 Pressure ulcer of unspecified site, unspecified stage: Secondary | ICD-10-CM | POA: Diagnosis not present

## 2017-07-07 DIAGNOSIS — I739 Peripheral vascular disease, unspecified: Secondary | ICD-10-CM | POA: Diagnosis not present

## 2017-07-07 DIAGNOSIS — K219 Gastro-esophageal reflux disease without esophagitis: Secondary | ICD-10-CM | POA: Diagnosis not present

## 2017-07-07 DIAGNOSIS — G309 Alzheimer's disease, unspecified: Secondary | ICD-10-CM | POA: Diagnosis not present

## 2017-07-07 DIAGNOSIS — R1310 Dysphagia, unspecified: Secondary | ICD-10-CM | POA: Diagnosis not present

## 2017-07-07 DIAGNOSIS — J301 Allergic rhinitis due to pollen: Secondary | ICD-10-CM | POA: Diagnosis not present

## 2017-07-08 DEATH — deceased

## 2017-08-07 DEATH — deceased
